# Patient Record
Sex: Male | Born: 1945 | Hispanic: Yes | Smoking: Never smoker
Health system: Southern US, Community
[De-identification: ages and names within clinical notes are randomized; demographics above are authoritative.]

---

## 2015-07-18 DIAGNOSIS — M109 Gout, unspecified: Secondary | ICD-10-CM | POA: Diagnosis not present

## 2015-07-18 DIAGNOSIS — Z1389 Encounter for screening for other disorder: Secondary | ICD-10-CM | POA: Diagnosis not present

## 2015-07-18 DIAGNOSIS — R030 Elevated blood-pressure reading, without diagnosis of hypertension: Secondary | ICD-10-CM | POA: Diagnosis not present

## 2015-07-18 DIAGNOSIS — Z7189 Other specified counseling: Secondary | ICD-10-CM | POA: Diagnosis not present

## 2015-07-18 DIAGNOSIS — Z0000 Encounter for general adult medical examination without abnormal findings: Secondary | ICD-10-CM | POA: Diagnosis not present

## 2015-08-27 DIAGNOSIS — M109 Gout, unspecified: Secondary | ICD-10-CM | POA: Diagnosis not present

## 2017-01-19 DIAGNOSIS — R030 Elevated blood-pressure reading, without diagnosis of hypertension: Secondary | ICD-10-CM | POA: Diagnosis not present

## 2017-01-19 DIAGNOSIS — H9313 Tinnitus, bilateral: Secondary | ICD-10-CM | POA: Diagnosis not present

## 2017-01-19 DIAGNOSIS — H903 Sensorineural hearing loss, bilateral: Secondary | ICD-10-CM | POA: Diagnosis not present

## 2017-04-08 DIAGNOSIS — I10 Essential (primary) hypertension: Secondary | ICD-10-CM | POA: Diagnosis not present

## 2017-04-08 DIAGNOSIS — M109 Gout, unspecified: Secondary | ICD-10-CM | POA: Diagnosis not present

## 2020-02-19 DIAGNOSIS — R339 Retention of urine, unspecified: Secondary | ICD-10-CM | POA: Diagnosis not present

## 2020-02-19 DIAGNOSIS — R109 Unspecified abdominal pain: Secondary | ICD-10-CM | POA: Diagnosis not present

## 2020-02-19 DIAGNOSIS — N390 Urinary tract infection, site not specified: Secondary | ICD-10-CM | POA: Diagnosis not present

## 2020-02-22 DIAGNOSIS — N401 Enlarged prostate with lower urinary tract symptoms: Secondary | ICD-10-CM | POA: Diagnosis not present

## 2020-02-22 DIAGNOSIS — Z978 Presence of other specified devices: Secondary | ICD-10-CM | POA: Diagnosis not present

## 2020-02-22 DIAGNOSIS — Z7689 Persons encountering health services in other specified circumstances: Secondary | ICD-10-CM | POA: Diagnosis not present

## 2020-02-22 DIAGNOSIS — R338 Other retention of urine: Secondary | ICD-10-CM | POA: Diagnosis not present

## 2020-02-27 DIAGNOSIS — Z139 Encounter for screening, unspecified: Secondary | ICD-10-CM | POA: Diagnosis not present

## 2020-02-27 DIAGNOSIS — Z13220 Encounter for screening for lipoid disorders: Secondary | ICD-10-CM | POA: Diagnosis not present

## 2020-02-27 DIAGNOSIS — Z1331 Encounter for screening for depression: Secondary | ICD-10-CM | POA: Diagnosis not present

## 2020-02-27 DIAGNOSIS — Z0000 Encounter for general adult medical examination without abnormal findings: Secondary | ICD-10-CM | POA: Diagnosis not present

## 2020-02-27 DIAGNOSIS — Z125 Encounter for screening for malignant neoplasm of prostate: Secondary | ICD-10-CM | POA: Diagnosis not present

## 2020-02-27 DIAGNOSIS — Z1339 Encounter for screening examination for other mental health and behavioral disorders: Secondary | ICD-10-CM | POA: Diagnosis not present

## 2020-02-27 DIAGNOSIS — I10 Essential (primary) hypertension: Secondary | ICD-10-CM | POA: Diagnosis not present

## 2020-02-27 DIAGNOSIS — Z136 Encounter for screening for cardiovascular disorders: Secondary | ICD-10-CM | POA: Diagnosis not present

## 2020-03-11 DIAGNOSIS — T839XXA Unspecified complication of genitourinary prosthetic device, implant and graft, initial encounter: Secondary | ICD-10-CM | POA: Diagnosis not present

## 2020-03-13 DIAGNOSIS — R8290 Unspecified abnormal findings in urine: Secondary | ICD-10-CM | POA: Diagnosis not present

## 2020-03-13 DIAGNOSIS — R339 Retention of urine, unspecified: Secondary | ICD-10-CM | POA: Diagnosis not present

## 2020-03-18 DIAGNOSIS — N390 Urinary tract infection, site not specified: Secondary | ICD-10-CM | POA: Diagnosis not present

## 2020-03-18 DIAGNOSIS — R339 Retention of urine, unspecified: Secondary | ICD-10-CM | POA: Diagnosis not present

## 2020-03-18 DIAGNOSIS — R310 Gross hematuria: Secondary | ICD-10-CM | POA: Diagnosis not present

## 2020-03-25 DIAGNOSIS — R339 Retention of urine, unspecified: Secondary | ICD-10-CM | POA: Diagnosis not present

## 2020-04-01 DIAGNOSIS — N401 Enlarged prostate with lower urinary tract symptoms: Secondary | ICD-10-CM | POA: Diagnosis not present

## 2020-04-01 DIAGNOSIS — R339 Retention of urine, unspecified: Secondary | ICD-10-CM | POA: Diagnosis not present

## 2020-04-01 DIAGNOSIS — N138 Other obstructive and reflux uropathy: Secondary | ICD-10-CM | POA: Diagnosis not present

## 2020-04-01 DIAGNOSIS — R338 Other retention of urine: Secondary | ICD-10-CM | POA: Diagnosis not present

## 2020-04-02 ENCOUNTER — Inpatient Hospital Stay (HOSPITAL_COMMUNITY): Payer: Medicare HMO

## 2020-04-02 ENCOUNTER — Emergency Department (HOSPITAL_COMMUNITY): Payer: Medicare HMO

## 2020-04-02 ENCOUNTER — Encounter (HOSPITAL_COMMUNITY)
Admission: EM | Disposition: A | Discharge status: Skilled Nursing Facility | Payer: Self-pay | Source: Home / Self Care | Attending: Neurology

## 2020-04-02 ENCOUNTER — Inpatient Hospital Stay: Admit: 2020-04-02 | Payer: Medicare HMO

## 2020-04-02 ENCOUNTER — Inpatient Hospital Stay (HOSPITAL_COMMUNITY)
Admission: EM | Admit: 2020-04-02 | Discharge: 2020-04-25 | DRG: 023 | Disposition: A | Discharge status: Skilled Nursing Facility | Payer: Medicare HMO | Attending: Student in an Organized Health Care Education/Training Program | Admitting: Student in an Organized Health Care Education/Training Program

## 2020-04-02 ENCOUNTER — Emergency Department (HOSPITAL_COMMUNITY): Payer: Medicare HMO | Admitting: Certified Registered"

## 2020-04-02 DIAGNOSIS — T17908A Unspecified foreign body in respiratory tract, part unspecified causing other injury, initial encounter: Secondary | ICD-10-CM | POA: Diagnosis not present

## 2020-04-02 DIAGNOSIS — I160 Hypertensive urgency: Secondary | ICD-10-CM | POA: Diagnosis present

## 2020-04-02 DIAGNOSIS — R339 Retention of urine, unspecified: Secondary | ICD-10-CM | POA: Diagnosis not present

## 2020-04-02 DIAGNOSIS — Z7141 Alcohol abuse counseling and surveillance of alcoholic: Secondary | ICD-10-CM

## 2020-04-02 DIAGNOSIS — B9689 Other specified bacterial agents as the cause of diseases classified elsewhere: Secondary | ICD-10-CM | POA: Diagnosis present

## 2020-04-02 DIAGNOSIS — R471 Dysarthria and anarthria: Secondary | ICD-10-CM | POA: Diagnosis present

## 2020-04-02 DIAGNOSIS — E876 Hypokalemia: Secondary | ICD-10-CM | POA: Diagnosis not present

## 2020-04-02 DIAGNOSIS — E870 Hyperosmolality and hypernatremia: Secondary | ICD-10-CM | POA: Diagnosis present

## 2020-04-02 DIAGNOSIS — R1312 Dysphagia, oropharyngeal phase: Secondary | ICD-10-CM | POA: Diagnosis not present

## 2020-04-02 DIAGNOSIS — Z79899 Other long term (current) drug therapy: Secondary | ICD-10-CM

## 2020-04-02 DIAGNOSIS — R29715 NIHSS score 15: Secondary | ICD-10-CM | POA: Diagnosis present

## 2020-04-02 DIAGNOSIS — R509 Fever, unspecified: Secondary | ICD-10-CM | POA: Diagnosis not present

## 2020-04-02 DIAGNOSIS — I69354 Hemiplegia and hemiparesis following cerebral infarction affecting left non-dominant side: Secondary | ICD-10-CM | POA: Diagnosis not present

## 2020-04-02 DIAGNOSIS — I639 Cerebral infarction, unspecified: Secondary | ICD-10-CM

## 2020-04-02 DIAGNOSIS — I6300 Cerebral infarction due to thrombosis of unspecified precerebral artery: Secondary | ICD-10-CM | POA: Diagnosis not present

## 2020-04-02 DIAGNOSIS — M25571 Pain in right ankle and joints of right foot: Secondary | ICD-10-CM

## 2020-04-02 DIAGNOSIS — R4781 Slurred speech: Secondary | ICD-10-CM | POA: Diagnosis not present

## 2020-04-02 DIAGNOSIS — N401 Enlarged prostate with lower urinary tract symptoms: Secondary | ICD-10-CM | POA: Diagnosis present

## 2020-04-02 DIAGNOSIS — R338 Other retention of urine: Secondary | ICD-10-CM | POA: Diagnosis present

## 2020-04-02 DIAGNOSIS — R414 Neurologic neglect syndrome: Secondary | ICD-10-CM | POA: Diagnosis present

## 2020-04-02 DIAGNOSIS — Z87440 Personal history of urinary (tract) infections: Secondary | ICD-10-CM

## 2020-04-02 DIAGNOSIS — I609 Nontraumatic subarachnoid hemorrhage, unspecified: Secondary | ICD-10-CM | POA: Diagnosis not present

## 2020-04-02 DIAGNOSIS — E871 Hypo-osmolality and hyponatremia: Secondary | ICD-10-CM | POA: Diagnosis not present

## 2020-04-02 DIAGNOSIS — K5900 Constipation, unspecified: Secondary | ICD-10-CM | POA: Diagnosis present

## 2020-04-02 DIAGNOSIS — R001 Bradycardia, unspecified: Secondary | ICD-10-CM | POA: Diagnosis not present

## 2020-04-02 DIAGNOSIS — R066 Hiccough: Secondary | ICD-10-CM | POA: Diagnosis not present

## 2020-04-02 DIAGNOSIS — N179 Acute kidney failure, unspecified: Secondary | ICD-10-CM | POA: Diagnosis not present

## 2020-04-02 DIAGNOSIS — I161 Hypertensive emergency: Secondary | ICD-10-CM | POA: Diagnosis not present

## 2020-04-02 DIAGNOSIS — G460 Middle cerebral artery syndrome: Secondary | ICD-10-CM | POA: Diagnosis present

## 2020-04-02 DIAGNOSIS — R29810 Facial weakness: Secondary | ICD-10-CM | POA: Diagnosis not present

## 2020-04-02 DIAGNOSIS — I63411 Cerebral infarction due to embolism of right middle cerebral artery: Principal | ICD-10-CM | POA: Diagnosis present

## 2020-04-02 DIAGNOSIS — T17908D Unspecified foreign body in respiratory tract, part unspecified causing other injury, subsequent encounter: Secondary | ICD-10-CM | POA: Diagnosis not present

## 2020-04-02 DIAGNOSIS — G8194 Hemiplegia, unspecified affecting left nondominant side: Secondary | ICD-10-CM | POA: Diagnosis not present

## 2020-04-02 DIAGNOSIS — G9389 Other specified disorders of brain: Secondary | ICD-10-CM | POA: Diagnosis not present

## 2020-04-02 DIAGNOSIS — Z20822 Contact with and (suspected) exposure to covid-19: Secondary | ICD-10-CM | POA: Diagnosis present

## 2020-04-02 DIAGNOSIS — I10 Essential (primary) hypertension: Secondary | ICD-10-CM | POA: Diagnosis present

## 2020-04-02 DIAGNOSIS — M2550 Pain in unspecified joint: Secondary | ICD-10-CM | POA: Diagnosis not present

## 2020-04-02 DIAGNOSIS — Z781 Physical restraint status: Secondary | ICD-10-CM

## 2020-04-02 DIAGNOSIS — G928 Other toxic encephalopathy: Secondary | ICD-10-CM | POA: Diagnosis not present

## 2020-04-02 DIAGNOSIS — E785 Hyperlipidemia, unspecified: Secondary | ICD-10-CM | POA: Diagnosis present

## 2020-04-02 DIAGNOSIS — I63511 Cerebral infarction due to unspecified occlusion or stenosis of right middle cerebral artery: Secondary | ICD-10-CM | POA: Diagnosis not present

## 2020-04-02 DIAGNOSIS — G936 Cerebral edema: Secondary | ICD-10-CM | POA: Diagnosis present

## 2020-04-02 DIAGNOSIS — Z8673 Personal history of transient ischemic attack (TIA), and cerebral infarction without residual deficits: Secondary | ICD-10-CM | POA: Diagnosis not present

## 2020-04-02 DIAGNOSIS — R6339 Other feeding difficulties: Secondary | ICD-10-CM | POA: Diagnosis not present

## 2020-04-02 DIAGNOSIS — M7989 Other specified soft tissue disorders: Secondary | ICD-10-CM | POA: Diagnosis not present

## 2020-04-02 DIAGNOSIS — G935 Compression of brain: Secondary | ICD-10-CM | POA: Diagnosis present

## 2020-04-02 DIAGNOSIS — Z7401 Bed confinement status: Secondary | ICD-10-CM | POA: Diagnosis not present

## 2020-04-02 DIAGNOSIS — I619 Nontraumatic intracerebral hemorrhage, unspecified: Secondary | ICD-10-CM | POA: Diagnosis not present

## 2020-04-02 DIAGNOSIS — R29718 NIHSS score 18: Secondary | ICD-10-CM | POA: Diagnosis not present

## 2020-04-02 DIAGNOSIS — R29818 Other symptoms and signs involving the nervous system: Secondary | ICD-10-CM | POA: Diagnosis not present

## 2020-04-02 DIAGNOSIS — R42 Dizziness and giddiness: Secondary | ICD-10-CM | POA: Diagnosis not present

## 2020-04-02 DIAGNOSIS — Z66 Do not resuscitate: Secondary | ICD-10-CM | POA: Diagnosis not present

## 2020-04-02 DIAGNOSIS — I63011 Cerebral infarction due to thrombosis of right vertebral artery: Secondary | ICD-10-CM | POA: Diagnosis not present

## 2020-04-02 DIAGNOSIS — I6601 Occlusion and stenosis of right middle cerebral artery: Secondary | ICD-10-CM | POA: Diagnosis not present

## 2020-04-02 DIAGNOSIS — N390 Urinary tract infection, site not specified: Secondary | ICD-10-CM | POA: Diagnosis present

## 2020-04-02 DIAGNOSIS — I6389 Other cerebral infarction: Secondary | ICD-10-CM | POA: Diagnosis not present

## 2020-04-02 DIAGNOSIS — M79672 Pain in left foot: Secondary | ICD-10-CM | POA: Diagnosis not present

## 2020-04-02 DIAGNOSIS — G839 Paralytic syndrome, unspecified: Secondary | ICD-10-CM | POA: Diagnosis not present

## 2020-04-02 DIAGNOSIS — W19XXXA Unspecified fall, initial encounter: Secondary | ICD-10-CM | POA: Diagnosis present

## 2020-04-02 DIAGNOSIS — S82891A Other fracture of right lower leg, initial encounter for closed fracture: Secondary | ICD-10-CM | POA: Diagnosis present

## 2020-04-02 DIAGNOSIS — J029 Acute pharyngitis, unspecified: Secondary | ICD-10-CM | POA: Diagnosis not present

## 2020-04-02 DIAGNOSIS — Z9889 Other specified postprocedural states: Secondary | ICD-10-CM | POA: Diagnosis not present

## 2020-04-02 DIAGNOSIS — T426X5A Adverse effect of other antiepileptic and sedative-hypnotic drugs, initial encounter: Secondary | ICD-10-CM | POA: Diagnosis not present

## 2020-04-02 DIAGNOSIS — I63311 Cerebral infarction due to thrombosis of right middle cerebral artery: Secondary | ICD-10-CM | POA: Diagnosis not present

## 2020-04-02 DIAGNOSIS — Z431 Encounter for attention to gastrostomy: Secondary | ICD-10-CM | POA: Diagnosis not present

## 2020-04-02 DIAGNOSIS — N400 Enlarged prostate without lower urinary tract symptoms: Secondary | ICD-10-CM | POA: Diagnosis not present

## 2020-04-02 DIAGNOSIS — R1310 Dysphagia, unspecified: Secondary | ICD-10-CM | POA: Diagnosis not present

## 2020-04-02 HISTORY — PX: RADIOLOGY WITH ANESTHESIA: SHX6223

## 2020-04-02 LAB — CBC WITH DIFFERENTIAL/PLATELET
Abs Immature Granulocytes: 0.05 10*3/uL (ref 0.00–0.07)
Basophils Absolute: 0 10*3/uL (ref 0.0–0.1)
Basophils Relative: 0 %
Eosinophils Absolute: 0 10*3/uL (ref 0.0–0.5)
Eosinophils Relative: 0 %
HCT: 42.4 % (ref 39.0–52.0)
Hemoglobin: 14.9 g/dL (ref 13.0–17.0)
Immature Granulocytes: 0 %
Lymphocytes Relative: 4 %
Lymphs Abs: 0.6 10*3/uL — ABNORMAL LOW (ref 0.7–4.0)
MCH: 29.8 pg (ref 26.0–34.0)
MCHC: 35.1 g/dL (ref 30.0–36.0)
MCV: 84.8 fL (ref 80.0–100.0)
Monocytes Absolute: 0.2 10*3/uL (ref 0.1–1.0)
Monocytes Relative: 1 %
Neutro Abs: 14 10*3/uL — ABNORMAL HIGH (ref 1.7–7.7)
Neutrophils Relative %: 95 %
Platelets: 244 10*3/uL (ref 150–400)
RBC: 5 MIL/uL (ref 4.22–5.81)
RDW: 12.2 % (ref 11.5–15.5)
WBC: 14.8 10*3/uL — ABNORMAL HIGH (ref 4.0–10.5)
nRBC: 0 % (ref 0.0–0.2)

## 2020-04-02 LAB — COMPREHENSIVE METABOLIC PANEL
ALT: 18 U/L (ref 0–44)
AST: 29 U/L (ref 15–41)
Albumin: 3.4 g/dL — ABNORMAL LOW (ref 3.5–5.0)
Alkaline Phosphatase: 86 U/L (ref 38–126)
Anion gap: 12 (ref 5–15)
BUN: 7 mg/dL — ABNORMAL LOW (ref 8–23)
CO2: 21 mmol/L — ABNORMAL LOW (ref 22–32)
Calcium: 8.5 mg/dL — ABNORMAL LOW (ref 8.9–10.3)
Chloride: 106 mmol/L (ref 98–111)
Creatinine, Ser: 0.9 mg/dL (ref 0.61–1.24)
GFR, Estimated: >60 mL/min (ref >60)
Glucose, Bld: 182 mg/dL — ABNORMAL HIGH (ref 70–99)
Potassium: 3.7 mmol/L (ref 3.5–5.1)
Sodium: 139 mmol/L (ref 135–145)
Total Bilirubin: 0.8 mg/dL (ref 0.3–1.2)
Total Protein: 6.5 g/dL (ref 6.5–8.1)

## 2020-04-02 LAB — PROTIME-INR
INR: 1 (ref 0.8–1.2)
Prothrombin Time: 13.1 seconds (ref 11.4–15.2)

## 2020-04-02 LAB — URINALYSIS, ROUTINE W REFLEX MICROSCOPIC
Bilirubin Urine: NEGATIVE
Glucose, UA: >=500 mg/dL — AB
Hgb urine dipstick: NEGATIVE
Ketones, ur: 5 mg/dL — AB
Leukocytes,Ua: NEGATIVE
Nitrite: NEGATIVE
Protein, ur: NEGATIVE mg/dL
Specific Gravity, Urine: 1.023 (ref 1.005–1.030)
pH: 5 (ref 5.0–8.0)

## 2020-04-02 LAB — GLUCOSE, CAPILLARY: Glucose-Capillary: 176 mg/dL — ABNORMAL HIGH (ref 70–99)

## 2020-04-02 LAB — RAPID URINE DRUG SCREEN, HOSP PERFORMED
Amphetamines: NOT DETECTED
Barbiturates: NOT DETECTED
Benzodiazepines: NOT DETECTED
Cocaine: NOT DETECTED
Opiates: NOT DETECTED
Tetrahydrocannabinol: NOT DETECTED

## 2020-04-02 LAB — ETHANOL: Alcohol, Ethyl (B): <10 mg/dL (ref <10)

## 2020-04-02 LAB — APTT: aPTT: 29 seconds (ref 24–36)

## 2020-04-02 IMAGING — XA IR PERCUTANEOUS ART THORMBECTOMY/INFUSION INTRACRANIAL INCLUDE D
8 of 19 series · 8 of 24 positions shown · IV contrast (IODINE)
Comparison: CT/CT angiogram of head and neck [DATE].

INDICATION: 74-year-old male with past medical history is significant for UTI,
urinary retention, recent hematuria and appendectomy presenting with
sudden onset left-sided hemiplegia. Left known well at [DATE] a.m. on
[DATE]. No IV tPA given due to recent hematuria. Premorbid
HEA SUN 0; NIHSS 15. Head CT showed early ischemic changes in
the frontoparietal and insular cortex (aspects 6) and CT angiogram
of the head and neck showed a distal right M1/MCA a with poor
collaterals. He was transferred to our service for a mechanical
thrombectomy.

EXAM:
Diagnostic cerebral angiogram
Mechanical thrombectomy
Flat panel head CT
TECHNIQUE: Informed written consent was obtained from the patient's daughter
after a thorough discussion of the procedural risks, benefits and
alternatives. All questions were addressed. Maximal Sterile Barrier
Technique was utilized including caps, mask, sterile gowns, sterile
gloves, sterile drape, hand hygiene and skin antiseptic. A timeout
was performed prior to the initiation of the procedure.

[Series 1: cerebral · 2 acquisitions, 1 frame shown (1 of 5)]
[im 1/2]
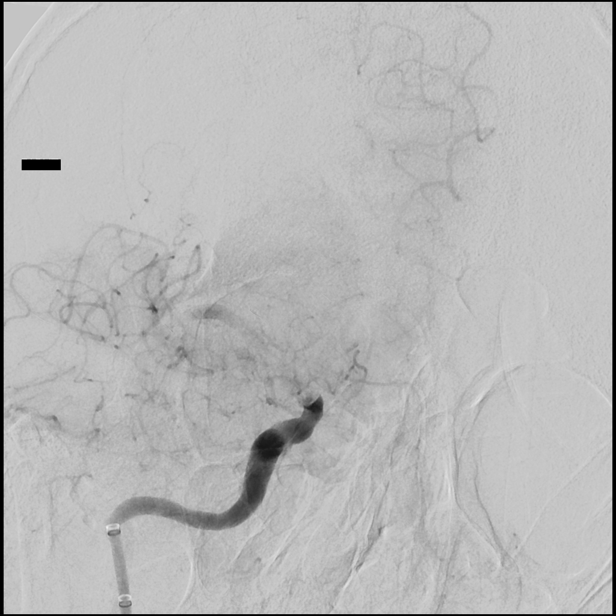

[Series 4: cerebral · 2 acquisitions, 1 frame shown (2 of 5)]
[im 1/2]
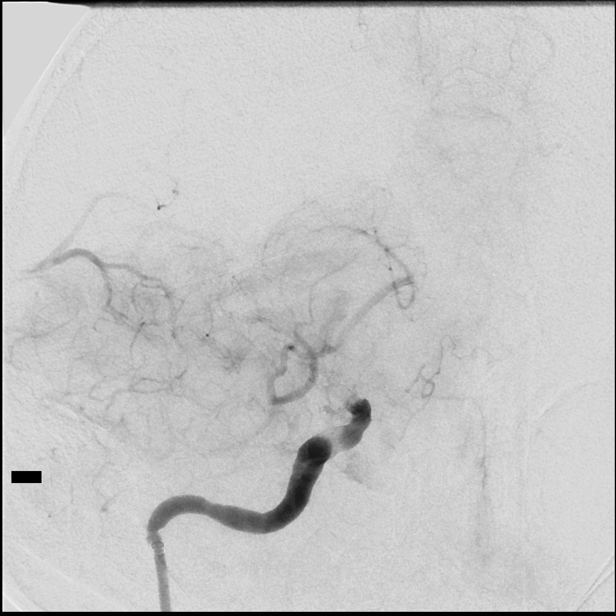

[Series 7: cerebral · 2 acquisitions, 1 frame shown (3 of 5)]
[im 1/2]
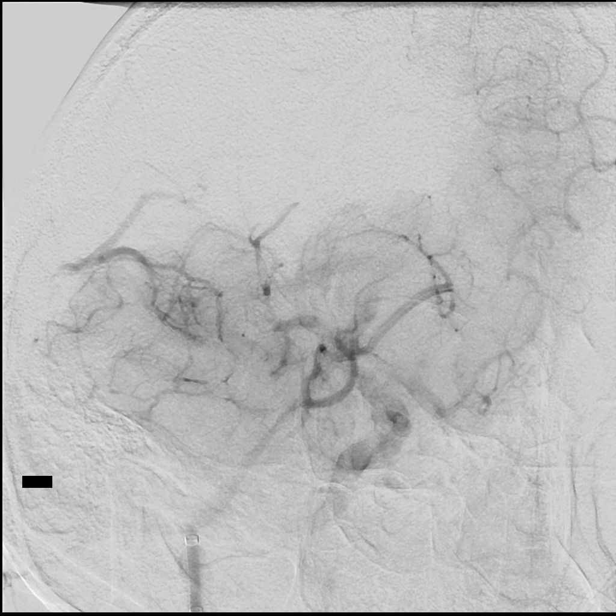

[Series 10: cerebral · 2 acquisitions, 1 frame shown (4 of 5)]
[im 1/2]
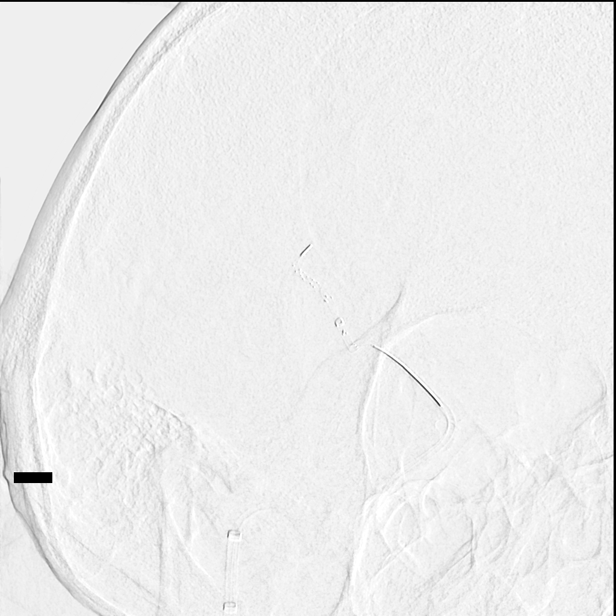

[Series 13: cerebral · 2 acquisitions, 1 frame shown (5 of 5)]
[im 1/2]
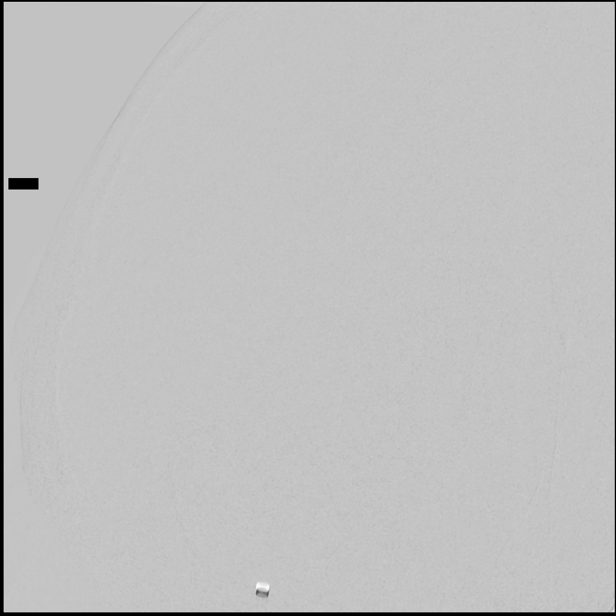

[Series 15: cerebral 2 · 2 acquisitions, 1 frame shown (1 of 2)]
[im 1/2]
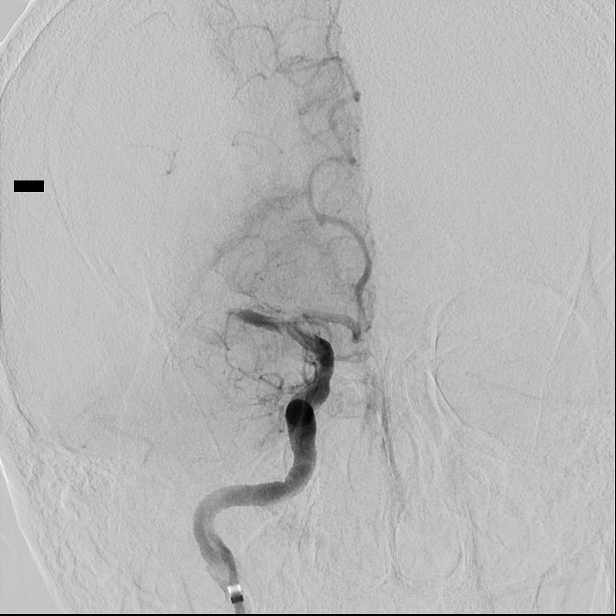

[Series 21: <mpr range> · axial · 5.0mm · 0.42mm/px · 1 of 35 slices shown]
[im 1/35]
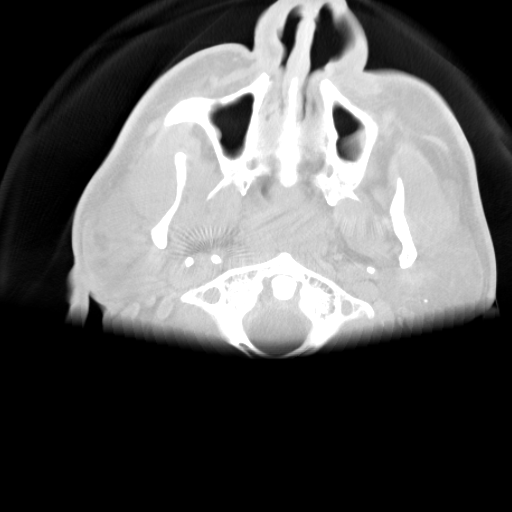

[Series 23: cerebral 2 · 2 acquisitions, 1 frame shown (2 of 2)]
[im 1/2]
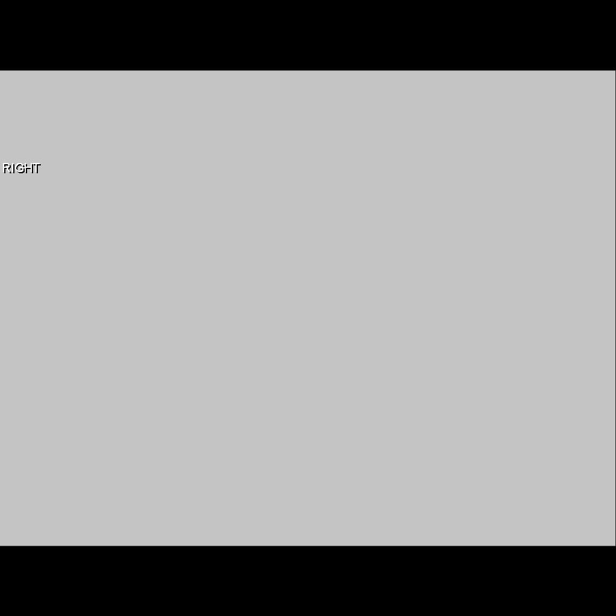

[8 of 24 positions shown; findings below may reference images not displayed]

MEDICATIONS:
10 mg of verapamil intra arterial

ANESTHESIA/SEDATION:
The procedure was performed in the general anesthesia.

CONTRAST:  50mL of Omnipaque 300

FLUOROSCOPY TIME:  Fluoroscopy Time:  minutes  seconds ( mGy).

COMPLICATIONS:
SIR Level A - No therapy, no consequence.
The right groin was prepped and draped in the usual sterile fashion.
Using a micropuncture kit and the modified Seldinger technique,
access was gained to the right common femoral artery and an 8 French
sheath was placed.

Under fluoroscopy, an 8 French Walrus balloon guide catheter was
navigated over a 6 HEA SUN 2 catheter and a 0.035" Terumo
Glidewire into the aortic arch. The catheter was placed into the
right common carotid artery and then advanced into the right
internal carotid artery. The inner catheter was removed. Frontal and
lateral angiograms of the head were obtained.
FINDINGS: 1. Increased tortuosity of the cervical right ICA.
2. Occlusion of the distal right M1/MCA.

PROCEDURE:
Under biplane roadmap, a zoom 71 aspiration catheter was navigated
over a phenom 21 microcatheter and a synchro support microguidewire
into the cavernous segment of the right ICA. The microcatheter was
then navigated over the wire into the right M1 segment. Then, the
aspiration catheter was advanced to the level of occlusion and
connected to a penumbra aspiration pump. The guiding catheter
balloon was inflated. The aspiration catheter was removed under
constant aspiration.

Follow-up angiogram with magnified frontal and lateral views of the
head showed persistent occlusion of the distal right M1/MCA.

Under biplane roadmap, a zoom 71 aspiration catheter was navigated
over a phenom 21 microcatheter and a synchro support microguidewire
into the cavernous segment of the right ICA. The microcatheter was
then navigated over the wire into the right M1 segment. Then, the
aspiration catheter was advanced to the level of occlusion and
connected to a penumbra aspiration pump. The guiding catheter
balloon was inflated. The aspiration catheter was removed under
constant aspiration.

Follow-up angiogram with magnified frontal and lateral views of the
head showed persistent occlusion of the distal right M1/MCA.

Under biplane roadmap, a zoom 71 aspiration catheter was navigated
over a phenom 21 microcatheter and a synchro support microguidewire
into the cavernous segment of the right ICA. The microcatheter was
then navigated over the wire into the right M2/MCA middle division
branch. Then, a 6 mm embotrap stent retriever was deployed spanning
the distal M1 and M2 segment. The device was allowed to intercalated
with the clot for 4 minutes. The microcatheter was removed. The
aspiration catheter was advanced to the level of occlusion and
connected to a penumbra aspiration pump. The guiding catheter
balloon was inflated. The thrombectomy device and aspiration
catheter were removed under constant aspiration.

Follow-up angiogram with magnified frontal and lateral views of the
head showed persistent near occlusion of the distal right M1/MCA
with some contrast penetration into distal branches.

Under biplane roadmap, a zoom 71 aspiration catheter was navigated
over a phenom 21 microcatheter and a synchro support microguidewire
into the cavernous segment of the right ICA. The microcatheter was
then navigated over the wire into the right M2/MCA middle division
branch. Then, a 5 x 37 mm embotrap stent retriever was deployed
spanning the distal M1 and M2 segment. The device was allowed to
intercalated with the clot for 4 minutes. The microcatheter was
removed. The aspiration catheter was advanced to the level of
occlusion and connected to a penumbra aspiration pump. The guiding
catheter balloon was inflated. The thrombectomy device and
aspiration catheter were removed under constant aspiration.

Follow-up angiogram with magnified frontal and lateral views of the
head showed persistent near occlusion of the distal right M1/MCA
with some contrast penetration into distal branches. Delayed
follow-up showed reocclusion.

Under biplane roadmap, a zoom 71 aspiration catheter was navigated
over a phenom 21 microcatheter and a synchro support microguidewire
into the cavernous segment of the right ICA. The microcatheter was
then navigated over the wire into the right M2/MCA middle division
branch. Then, a 5 x 37 mm embotrap stent retriever was deployed
spanning the distal M1 and M2 segment. The device was allowed to
intercalated with the clot for 4 minutes. The microcatheter was
removed. The aspiration catheter was advanced to the level of
occlusion and connected to a penumbra aspiration pump. The guiding
catheter balloon was inflated. The thrombectomy device and
aspiration catheter were removed under constant aspiration.

Follow-up angiogram with magnified frontal and lateral views of the
head showed persistent near occlusion of the distal right M1/MCA
with some contrast penetration into distal branches. Delayed
follow-up showed reocclusion.

Under biplane roadmap, a zoom 71 aspiration catheter was navigated
over a phenom 21 microcatheter and a synchro support microguidewire
into the cavernous segment of the right ICA. The microcatheter was
then navigated over the wire into the right M2/MCA posterior
division branch. Then, a 5 x 37 mm embotrap stent retriever was
deployed spanning the distal M1 and M2 segment. The device was
allowed to intercalated with the clot for 4 minutes. The
microcatheter was removed. The aspiration catheter was advanced to
the level of occlusion and connected to a penumbra aspiration pump.
The guiding catheter balloon was inflated. The thrombectomy device
and aspiration catheter were removed under constant aspiration.

Follow-up angiogram with magnified frontal and lateral views of the
head showed persistent near occlusion of the distal right M1/MCA
with some contrast penetration into distal branches. Delayed
follow-up showed reocclusion.

Flat panel CT of the head was obtained and post processed in a
separate workstation with concurrent attending physician
supervision. Selected images were sent to PACS. No evidence of
complication.

Under biplane roadmap, a zoom 71 aspiration catheter was navigated
over a phenom 21 microcatheter and a synchro support microguidewire
into the cavernous segment of the right ICA. The microcatheter was
then navigated over the wire into the right M2/MCA posterior
division branch. Then, a tiger 7 stent retriever was deployed
spanning the distal M1 and M2 segment. The device was allowed to
intercalated with the clot for 4 minutes. The microcatheter was
removed. The aspiration catheter was advanced to the level of
occlusion and connected to a penumbra aspiration pump. The guiding
catheter balloon was inflated. The thrombectomy device and
aspiration catheter were removed under constant aspiration.

Follow-up angiogram with magnified frontal and lateral views of the
head showed persistent near occlusion of the distal right M1/MCA
with some contrast penetration into distal branches. Delayed
follow-up showed reocclusion.

Under biplane roadmap, a zoom 71 aspiration catheter was navigated
over a phenom 21 microcatheter and a synchro support microguidewire
into the cavernous segment of the right ICA. The microcatheter was
then navigated over the wire into the right M2/MCA posterior
division branch. Then, a 4 x 40 mm solitaire stent retriever was
deployed spanning the distal M1 and M2 segment. The device was
allowed to intercalated with the clot for 4 minutes. The
microcatheter was removed. The aspiration catheter was advanced to
the level of occlusion and connected to a penumbra aspiration pump.
The guiding catheter balloon was inflated. The thrombectomy device
and aspiration catheter were removed under constant aspiration.

Follow-up angiogram with magnified frontal and lateral views of the
head showed persistent near occlusion of the distal right M1/MCA
with some contrast penetration into distal branches. Delayed
follow-up showed reocclusion.

Under biplane roadmap, a zoom 71 aspiration catheter was navigated
over a phenom 21 microcatheter and a synchro support microguidewire
into the cavernous segment of the right ICA. The microcatheter was
then navigated over the wire into the right M2/MCA middle division
branch. Then, a 4 x 40 mm solitaire stent retriever was deployed
spanning the distal M1 and M2 segment. The device was allowed to
intercalated with the clot for 4 minutes. The microcatheter was
removed. The aspiration catheter was advanced to the level of
occlusion and connected to a penumbra aspiration pump. The guiding
catheter balloon was inflated. The thrombectomy device and
aspiration catheter were removed under constant aspiration.

Follow-up angiogram with magnified frontal and lateral views of the
head showed persistent near occlusion of the distal right M1/MCA
with some contrast penetration into distal branches. Delayed
follow-up showed reocclusion.

Flat panel CT of the head was obtained and post processed in a
separate workstation with concurrent attending physician
supervision. Selected images were sent to PACS. Small contrast
extravasation seen into the right sylvian fissure.

The catheter construct was subsequently withdrawn.

Right common femoral artery angiograms were obtained frontal and
lateral views. The femoral sheath was then exchanged over the wire
for a Perclose ProGlide which was utilized for access closure.
Immediate hemostasis was achieved.
IMPRESSION: Unsuccessful mechanical thrombectomy for treatment of a distal right
M1/MCA occlusion. Multiple attempts performed with temporary partial
recanalization followed by subsequent reocclusion. A total of 8
passes were performed in different branches without recanalization
([X2]).

PLAN:
Patient was transferred to ICU for continued care.

## 2020-04-02 IMAGING — CT CT HEAD W/O CM
2 series · 15 of 30 positions shown, 17 images · non-contrast
Comparison: CT studies earlier same day.

CLINICAL DATA: Follow-up stroke with thrombectomy attempt.

EXAM:
CT HEAD WITHOUT CONTRAST
TECHNIQUE: Contiguous axial images were obtained from the base of the skull
through the vertex without intravenous contrast.

[Series 3: head without · axial · non-contrast · 0.51mm/px · z∈[-37,+88]mm · 7 of 35 slices shown, 9 images]
[im 5/35  brain]
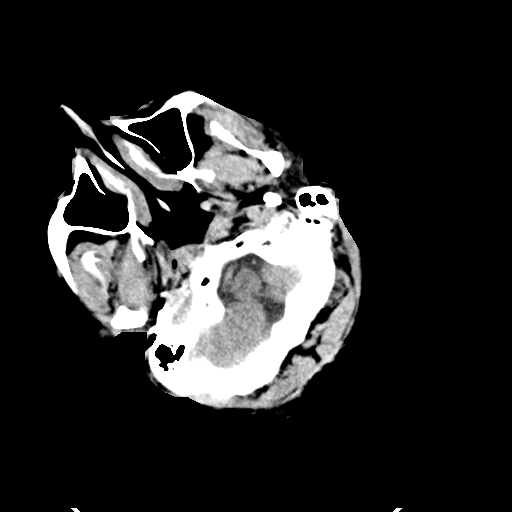
[im 5/35  bone]
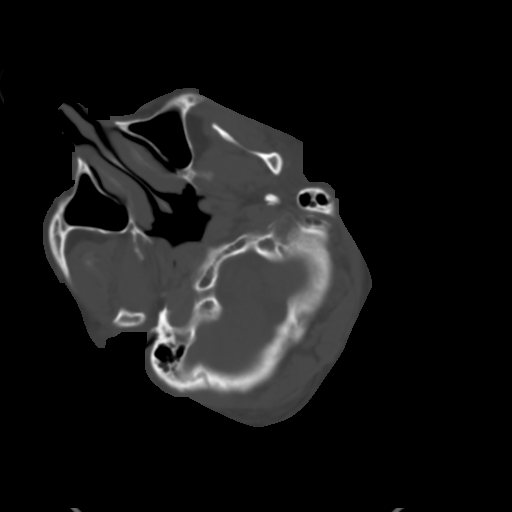
[im 9/35  brain]
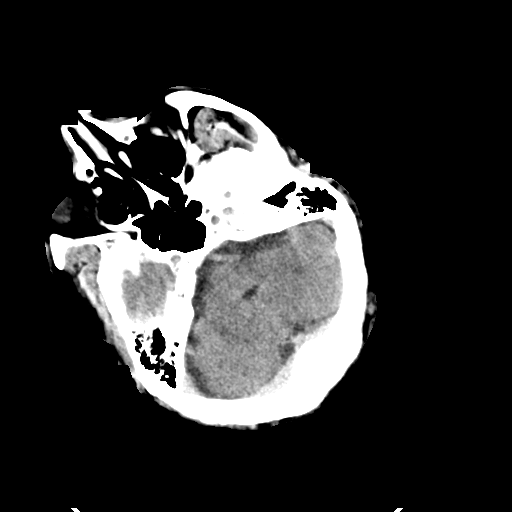
[im 13/35  brain]
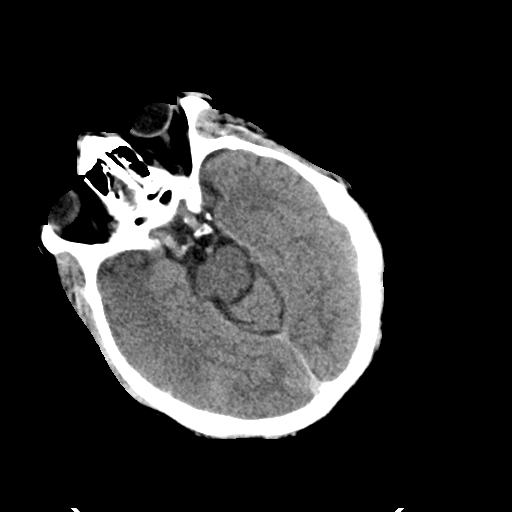
[im 18/35  brain]
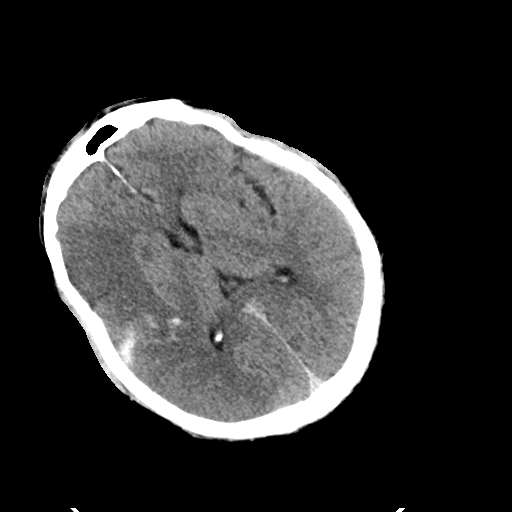
[im 22/35  brain]
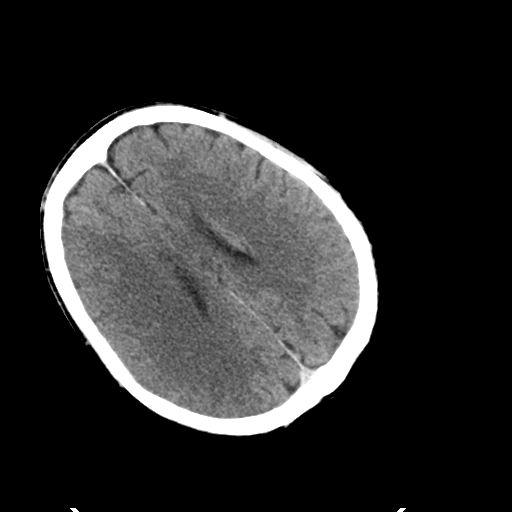
[im 22/35  bone]
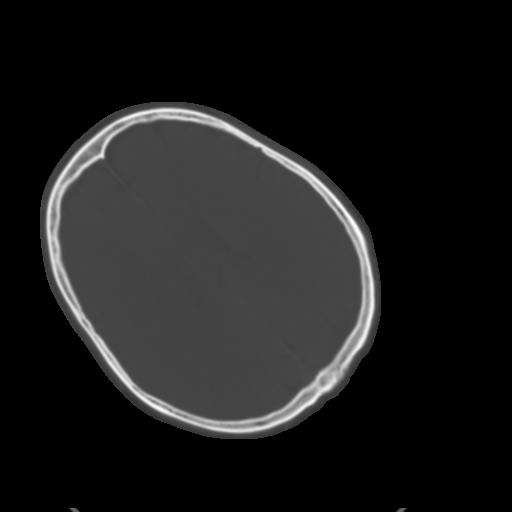
[im 26/35  brain]
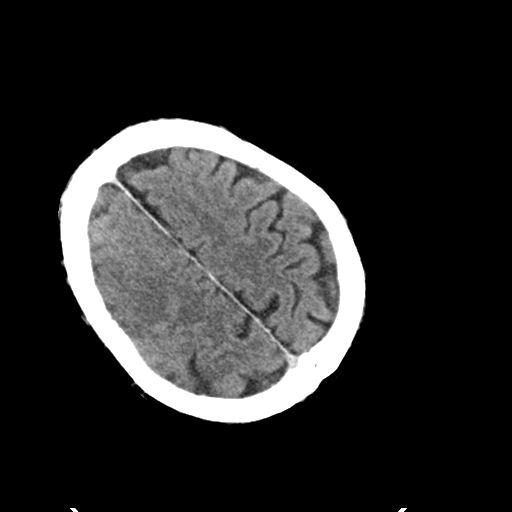
[im 30/35  brain]
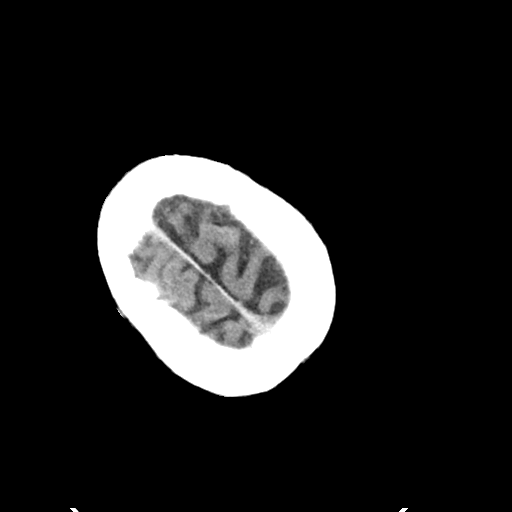

[Series 4: head bone · axial · 0.51mm/px · z∈[-41,+95]mm · 8 of 86 slices shown]
[im 9/86  bone]
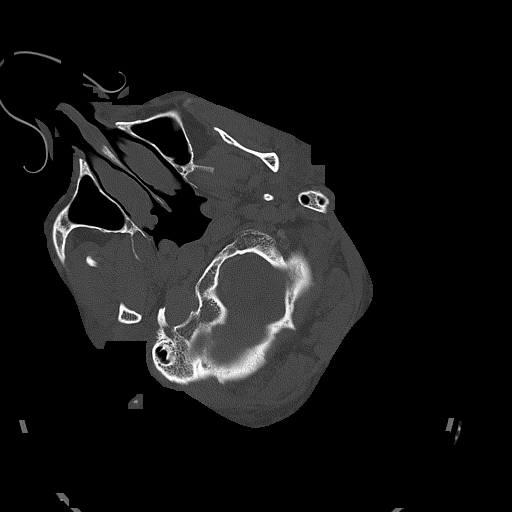
[im 18/86  bone]
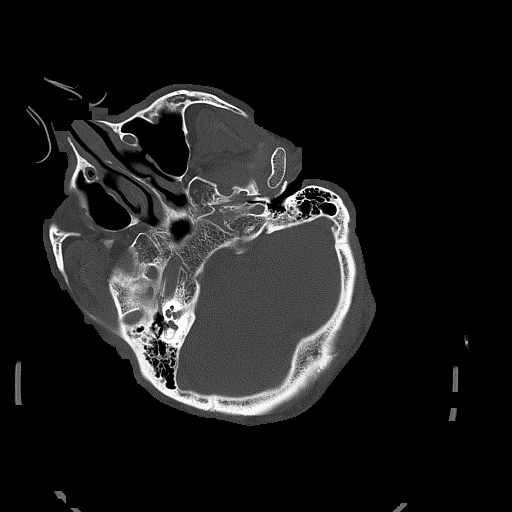
[im 26/86  bone]
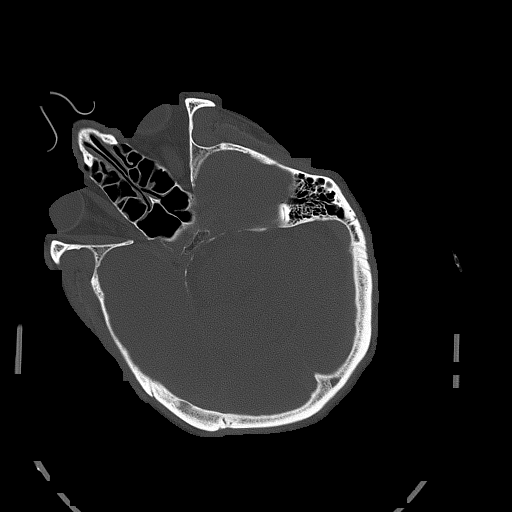
[im 39/86  bone]
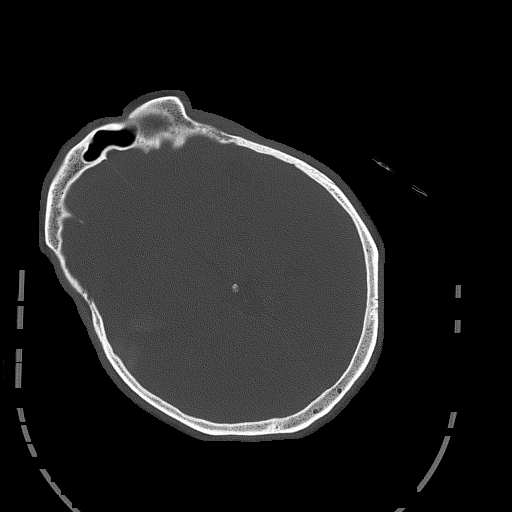
[im 47/86  bone]
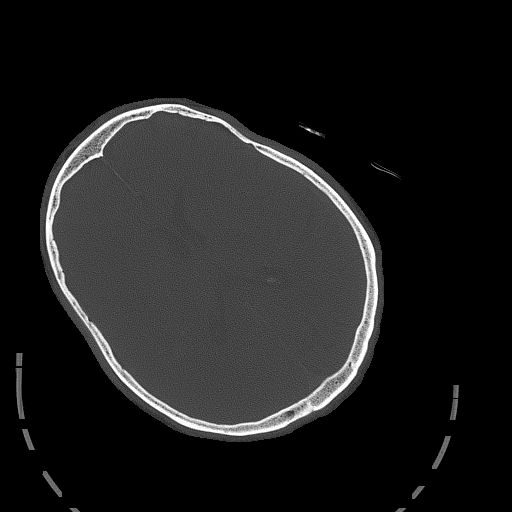
[im 60/86  bone]
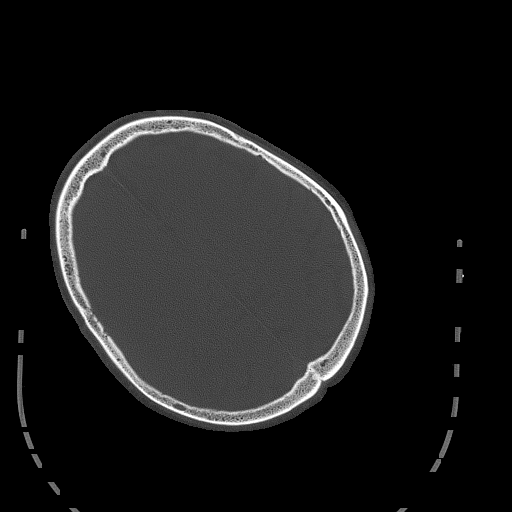
[im 69/86  bone]
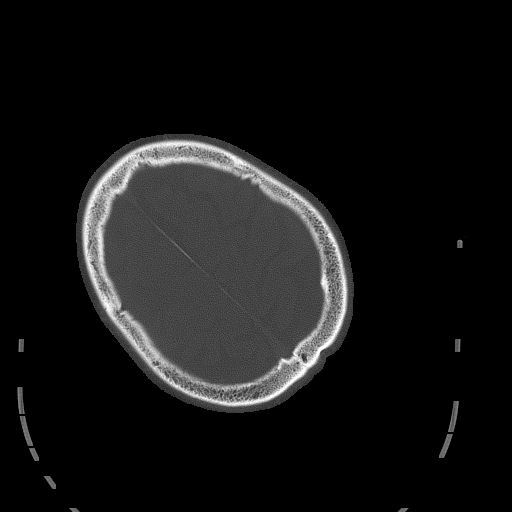
[im 77/86  bone]
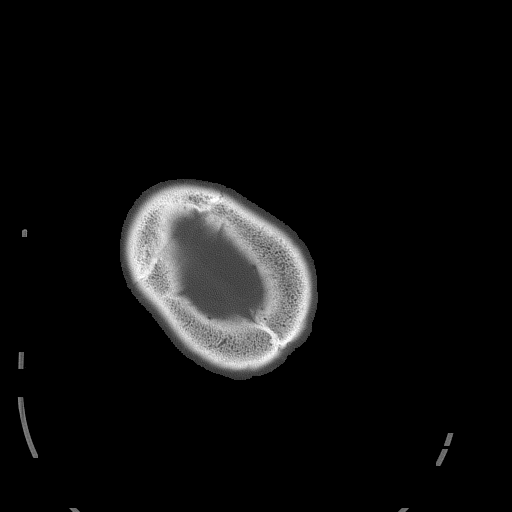

[15 of 30 positions shown; findings below may reference images not displayed]

FINDINGS: Brain: No acute finding affecting the brainstem or left cerebral
hemisphere. On the right, there is low-density now seen throughout
the right middle cerebral artery territory consistent with complete
right MCA territory infarction. This includes at least partial
involvement of the basal ganglia. Areas of hyperdensity in the deep
insula and frontoparietal junction region could represent a
combination of contrast staining and a small amount of hemorrhage.
No large confluent hematoma. Mild swelling with early mass-effect,
right-to-left shift of 2 mm. No hydrocephalus. No extra-axial
collection.

Vascular: No new vascular finding.

Skull: Normal

Sinuses/Orbits: Clear/normal

Other: None
IMPRESSION: Complete right MCA territory infarction, with at least partial
involvement of the basal ganglia. Areas of hyperdensity in the deep
insula and frontoparietal junction region could represent a
combination of contrast staining and a small amount of hemorrhage.
No large confluent hematoma. Mild swelling with early mass-effect,
right-to-left shift of 2 mm.

## 2020-04-02 SURGERY — IR WITH ANESTHESIA
Anesthesia: General

## 2020-04-02 MED ORDER — ACETAMINOPHEN 160 MG/5ML PO SOLN: 650.0000 mg | ORAL | Status: DC | PRN

## 2020-04-02 MED ORDER — PHENYLEPHRINE HCL-NACL 10-0.9 MG/250ML-% IV SOLN
INTRAVENOUS | Status: DC | PRN
  Administered 2020-04-02: 20 ug/min via INTRAVENOUS

## 2020-04-02 MED ORDER — ENOXAPARIN SODIUM 40 MG/0.4ML ~~LOC~~ SOLN
40.0000 mg | SUBCUTANEOUS | Status: DC
  Administered 2020-04-03 – 2020-04-04 (×2): 40 mg via SUBCUTANEOUS
  Filled 2020-04-02 (×2): qty 0.4

## 2020-04-02 MED ORDER — ACETAMINOPHEN 325 MG PO TABS: 650.0000 mg | ORAL_TABLET | ORAL | Status: DC | PRN

## 2020-04-02 MED ORDER — FENTANYL CITRATE (PF) 250 MCG/5ML IJ SOLN
INTRAMUSCULAR | Status: DC | PRN
  Administered 2020-04-02: 100 ug via INTRAVENOUS

## 2020-04-02 MED ORDER — CLOPIDOGREL BISULFATE 300 MG PO TABS
ORAL_TABLET | ORAL | Status: AC
  Filled 2020-04-02: qty 1

## 2020-04-02 MED ORDER — ASPIRIN 81 MG PO CHEW
CHEWABLE_TABLET | ORAL | Status: AC
  Filled 2020-04-02: qty 1

## 2020-04-02 MED ORDER — VERAPAMIL HCL 2.5 MG/ML IV SOLN
INTRAVENOUS | Status: AC
  Filled 2020-04-02: qty 2

## 2020-04-02 MED ORDER — SUGAMMADEX SODIUM 200 MG/2ML IV SOLN
INTRAVENOUS | Status: DC | PRN
  Administered 2020-04-02: 200 mg via INTRAVENOUS

## 2020-04-02 MED ORDER — CLEVIDIPINE BUTYRATE 0.5 MG/ML IV EMUL
INTRAVENOUS | Status: DC | PRN
Start: 2020-04-02 — End: 2020-04-02
  Administered 2020-04-02: 1 mg/h via INTRAVENOUS

## 2020-04-02 MED ORDER — TIROFIBAN HCL IN NACL 5-0.9 MG/100ML-% IV SOLN
INTRAVENOUS | Status: AC
  Filled 2020-04-02: qty 100

## 2020-04-02 MED ORDER — TICAGRELOR 90 MG PO TABS
ORAL_TABLET | ORAL | Status: AC
  Filled 2020-04-02: qty 2

## 2020-04-02 MED ORDER — IOHEXOL 300 MG/ML  SOLN
50.0000 mL | Freq: Once | INTRAMUSCULAR | Status: AC | PRN
  Administered 2020-04-02: 30 mL via INTRA_ARTERIAL

## 2020-04-02 MED ORDER — VERAPAMIL HCL 2.5 MG/ML IV SOLN
INTRAVENOUS | Status: AC | PRN
  Administered 2020-04-02 (×2): 5 mg via INTRAVENOUS

## 2020-04-02 MED ORDER — ASPIRIN 300 MG RE SUPP
300.0000 mg | Freq: Every day | RECTAL | Status: DC
  Administered 2020-04-03 – 2020-04-04 (×2): 300 mg via RECTAL
  Filled 2020-04-02 (×2): qty 1

## 2020-04-02 MED ORDER — LIDOCAINE 2% (20 MG/ML) 5 ML SYRINGE
INTRAMUSCULAR | Status: DC | PRN
  Administered 2020-04-02: 60 mg via INTRAVENOUS

## 2020-04-02 MED ORDER — HEPARIN SODIUM (PORCINE) 1000 UNIT/ML IJ SOLN
INTRAMUSCULAR | Status: AC
  Filled 2020-04-02: qty 1

## 2020-04-02 MED ORDER — SODIUM CHLORIDE 0.9% FLUSH
3.0000 mL | Freq: Two times a day (BID) | INTRAVENOUS | Status: DC
  Administered 2020-04-02 – 2020-04-25 (×35): 3 mL via INTRAVENOUS

## 2020-04-02 MED ORDER — CLEVIDIPINE BUTYRATE 0.5 MG/ML IV EMUL
INTRAVENOUS | Status: AC
  Filled 2020-04-02: qty 50

## 2020-04-02 MED ORDER — CANGRELOR TETRASODIUM 50 MG IV SOLR
INTRAVENOUS | Status: AC
  Filled 2020-04-02: qty 50

## 2020-04-02 MED ORDER — IOHEXOL 240 MG/ML SOLN
INTRAMUSCULAR | Status: AC
  Administered 2020-04-02: 75 mL
  Filled 2020-04-02: qty 200

## 2020-04-02 MED ORDER — ROCURONIUM BROMIDE 10 MG/ML (PF) SYRINGE
PREFILLED_SYRINGE | INTRAVENOUS | Status: DC | PRN
  Administered 2020-04-02 (×2): 50 mg via INTRAVENOUS

## 2020-04-02 MED ORDER — PHENYLEPHRINE 40 MCG/ML (10ML) SYRINGE FOR IV PUSH (FOR BLOOD PRESSURE SUPPORT)
PREFILLED_SYRINGE | INTRAVENOUS | Status: DC | PRN
  Administered 2020-04-02 (×4): 80 ug via INTRAVENOUS

## 2020-04-02 MED ORDER — SODIUM CHLORIDE 0.9 % IV SOLN: INTRAVENOUS | Status: DC | PRN

## 2020-04-02 MED ORDER — SODIUM CHLORIDE 0.9 % IV SOLN
100.0000 mL/h | INTRAVENOUS | Status: DC
  Administered 2020-04-03: 100 mL/h via INTRAVENOUS

## 2020-04-02 MED ORDER — SUCCINYLCHOLINE CHLORIDE 200 MG/10ML IV SOSY
PREFILLED_SYRINGE | INTRAVENOUS | Status: DC | PRN
  Administered 2020-04-02: 120 mg via INTRAVENOUS

## 2020-04-02 MED ORDER — ONDANSETRON HCL 4 MG/2ML IJ SOLN
INTRAMUSCULAR | Status: DC | PRN
  Administered 2020-04-02: 4 mg via INTRAVENOUS

## 2020-04-02 MED ORDER — CLEVIDIPINE BUTYRATE 0.5 MG/ML IV EMUL
0.0000 mg/h | INTRAVENOUS | Status: DC
  Administered 2020-04-02: 12 mg/h via INTRAVENOUS
  Administered 2020-04-02: 15 mg/h via INTRAVENOUS
  Administered 2020-04-05 – 2020-04-06 (×2): 10 mg/h via INTRAVENOUS
  Administered 2020-04-06: 18 mg/h via INTRAVENOUS
  Administered 2020-04-06: 15 mg/h via INTRAVENOUS
  Administered 2020-04-06: 20 mg/h via INTRAVENOUS
  Administered 2020-04-06: 18 mg/h via INTRAVENOUS
  Administered 2020-04-06: 16 mg/h via INTRAVENOUS
  Administered 2020-04-07: 9 mg/h via INTRAVENOUS
  Administered 2020-04-07: 4 mg/h via INTRAVENOUS
  Administered 2020-04-07: 9 mg/h via INTRAVENOUS
  Administered 2020-04-07 (×2): 8 mg/h via INTRAVENOUS
  Administered 2020-04-07: 6 mg/h via INTRAVENOUS
  Administered 2020-04-08: 4 mg/h via INTRAVENOUS
  Filled 2020-04-02 (×3): qty 50
  Filled 2020-04-02: qty 100
  Filled 2020-04-02 (×5): qty 50
  Filled 2020-04-02: qty 100
  Filled 2020-04-02: qty 50
  Filled 2020-04-02: qty 100
  Filled 2020-04-02 (×7): qty 50

## 2020-04-02 MED ORDER — DEXAMETHASONE SODIUM PHOSPHATE 10 MG/ML IJ SOLN
INTRAMUSCULAR | Status: DC | PRN
  Administered 2020-04-02: 5 mg via INTRAVENOUS

## 2020-04-02 MED ORDER — PROPOFOL 10 MG/ML IV BOLUS
INTRAVENOUS | Status: DC | PRN
Start: 2020-04-02 — End: 2020-04-02
  Administered 2020-04-02: 200 mg via INTRAVENOUS

## 2020-04-02 MED ORDER — IOHEXOL 240 MG/ML SOLN
150.0000 mL | Freq: Once | INTRAMUSCULAR | Status: AC | PRN
  Administered 2020-04-02: 15 mL via INTRA_ARTERIAL

## 2020-04-02 MED ORDER — ACETAMINOPHEN 650 MG RE SUPP
650.0000 mg | RECTAL | Status: DC | PRN
  Administered 2020-04-04: 650 mg via RECTAL
  Filled 2020-04-02: qty 1

## 2020-04-02 MED ORDER — SODIUM CHLORIDE 0.9 % IV BOLUS: 500.0000 mL | Freq: Once | INTRAVENOUS | Status: DC

## 2020-04-02 MED ORDER — EPTIFIBATIDE 20 MG/10ML IV SOLN
INTRAVENOUS | Status: AC
  Filled 2020-04-02: qty 10

## 2020-04-02 MED ORDER — CHLORHEXIDINE GLUCONATE CLOTH 2 % EX PADS
6.0000 | MEDICATED_PAD | Freq: Every day | CUTANEOUS | Status: DC
  Administered 2020-04-02 – 2020-04-25 (×23): 6 via TOPICAL

## 2020-04-02 NOTE — ED Notes (Signed)
Pt to IR

## 2020-04-02 NOTE — Anesthesia Postprocedure Evaluation (Signed)
Anesthesia Post Note  Patient: Azel Gumina  Procedure(s) Performed: IR WITH ANESTHESIA (N/A )     Patient location during evaluation: PACU Anesthesia Type: General Level of consciousness: awake and alert and oriented Pain management: pain level controlled Vital Signs Assessment: post-procedure vital signs reviewed and stable Respiratory status: spontaneous breathing, nonlabored ventilation and respiratory function stable Cardiovascular status: blood pressure returned to baseline Postop Assessment: no apparent nausea or vomiting Anesthetic complications: no   No complications documented.  Last Vitals:  Vitals:   04/02/20 1745 04/02/20 1800  BP: (!) 141/61 138/62  Pulse: 82 82  Resp: 13 14  SpO2: 100% 100%    Last Pain: There were no vitals filed for this visit.               Kaylyn Layer

## 2020-04-02 NOTE — Sedation Documentation (Signed)
Pt transported to 4N 28 accompanied by CRNA and RN. Site remains level 0. Pt now starting to wake up moving right extremities. No s/s of distress at this time. VSS.

## 2020-04-02 NOTE — Progress Notes (Signed)
Patient to unit with indwelling catheter from home. Baseline retention/I&O cath, recent catheter placement.   Belongings of ring and cell phone given to daughter [name].

## 2020-04-02 NOTE — Progress Notes (Signed)
Orthopedic Tech Progress Note Patient Details:  Gregory Osborn 1945/08/11 [id number]  Ortho Devices Type of Ortho Device: Knee Immobilizer Ortho Device/Splint Location: RLE Ortho Device/Splint Interventions: Ordered,Application,Adjustment   Post Interventions Patient Tolerated: Well Instructions Provided: Care of device   Donald Pore 04/02/2020, 6:18 PM

## 2020-04-02 NOTE — Code Documentation (Signed)
Patient direct admit from [facility] to [facility] IR. Pt had a CT and CTA at [facility] which showed a distal M1 occlusion according to MD.  He was transferred to [facility] and met by [facility] and MD and taken to [facility] along with a translator as pt is mostly [place] speaking. He was LKW at 1125 when he had a fall and family found him with left sided paralysis, facial droop, and slurred speech. He had a right gaze as well. Pt is currently being treated for a UTI. He has a hx of HTN and BPH and is taking BP meds. He was not able to receive TPA r/t hematuria. He has an indwelling catheter for chronic use. Pt's NIHSS 17 on assessment. (see documentation for details). Once in IR patient was prepped and intubated by anesthesia. Stroke RN and IR MD got verbal consent over the phone with patient's daughter for thrombectomy. Patient was taken into IR suite and hand off was made with IR RN. [name] will fax over [facility] results once they come back. Results are unknown at this time. Family was told that they will be updated by phone when procedure is complete. 4N ICU charge RN notified of bed need.  Linna Thebeau, Rande Brunt, RN  Stroke Response RN

## 2020-04-02 NOTE — Anesthesia Preprocedure Evaluation (Signed)
Anesthesia Evaluation  Patient identified by MRN, date of birth, ID band Patient awake    Reviewed: Allergy & Precautions, H&P , NPO status , Patient's Chart, lab work & pertinent test results  Airway Mallampati: II  TM Distance: >3 FB Neck ROM: Full    Dental no notable dental hx. (+) Poor Dentition, Dental Advisory Given   Pulmonary neg pulmonary ROS,    Pulmonary exam normal breath sounds clear to auscultation       Cardiovascular hypertension,  Rhythm:Regular Rate:Normal     Neuro/Psych CVA, Residual Symptoms negative psych ROS   GI/Hepatic negative GI ROS, Neg liver ROS,   Endo/Other  negative endocrine ROS  Renal/GU negative Renal ROS  negative genitourinary   Musculoskeletal   Abdominal   Peds  Hematology negative hematology ROS (+)   Anesthesia Other Findings   Reproductive/Obstetrics negative OB ROS                             Anesthesia Physical Anesthesia Plan  ASA: III and emergent  Anesthesia Plan: General   Post-op Pain Management:    Induction: Intravenous, Rapid sequence and Cricoid pressure planned  PONV Risk Score and Plan: 2 and Ondansetron, Dexamethasone and Treatment may vary due to age or medical condition  Airway Management Planned: Oral ETT  Additional Equipment:   Intra-op Plan:   Post-operative Plan: Extubation in OR and Possible Post-op intubation/ventilation  Informed Consent: I have reviewed the patients History and Physical, chart, labs and discussed the procedure including the risks, benefits and alternatives for the proposed anesthesia with the patient or authorized representative who has indicated his/her understanding and acceptance.     Dental advisory given  Plan Discussed with: CRNA  Anesthesia Plan Comments:         Anesthesia Quick Evaluation

## 2020-04-02 NOTE — Progress Notes (Signed)
One yellow colored ring upon admission no other belongings. Placed ring in cup at computer.

## 2020-04-02 NOTE — Anesthesia Procedure Notes (Signed)
Procedure Name: Intubation Date/Time: 04/02/2020 1:58 PM Performed by: Tressia Miners, CRNA Pre-anesthesia Checklist: Patient identified, Emergency Drugs available, Suction available and Patient being monitored Patient Re-evaluated:Patient Re-evaluated prior to induction Oxygen Delivery Method: Circle System Utilized Preoxygenation: Pre-oxygenation with 100% oxygen Induction Type: IV induction Ventilation: Unable to mask ventilate Laryngoscope Size: Glidescope and 3 Grade View: Grade I Tube type: Oral Tube size: 7.5 mm Number of attempts: 1 Airway Equipment and Method: Stylet Placement Confirmation: ETT inserted through vocal cords under direct vision,  positive ETCO2 and breath sounds checked- equal and bilateral Secured at: 21 cm Tube secured with: Tape Dental Injury: Teeth and Oropharynx as per pre-operative assessment  Comments: Unknown COVID status, stroke code RSI performed with S3 LoPro glidescope blade

## 2020-04-02 NOTE — Procedures (Signed)
INTERVENTIONAL NEURORADIOLOGY BRIEF POSTPROCEDURE NOTE  Diagnostic cerebral angiogram and mechanical thrombectomy   Attending: Dr. Baldemar Lenis   Assistant: None  Diagnosis: Right M1/MCa occlusion  Access site: RCFA 8 F  Access closure: Perclose Proglide  Anesthesia: General anesthesia  Medication used: refer to anesthesia documentation.  Complications: Smal right sylvian SAH  Estimated blood loss:  Specimen: None  Findings: Right M1 occlusion. Multiple passes performed with aspiration and different stent retrievers (embotrap, solitaire and tiger). Temporary recanalization achieved multiple times with subsequent reocclusion. Total of 8 passes performed.   Patient transferred to ICU.

## 2020-04-02 NOTE — Transfer of Care (Signed)
Immediate Anesthesia Transfer of Care Note  Patient: Gregory Osborn  Procedure(s) Performed: IR WITH ANESTHESIA (N/A )  Patient Location: ICU  Anesthesia Type:General  Level of Consciousness: awake  Airway & Oxygen Therapy: Patient Spontanous Breathing and Patient connected to face mask oxygen  Post-op Assessment: Report given to RN, Post -op Vital signs reviewed and stable and pt moving right arm and leg, left sided deficit which was the same as pre-op  Post vital signs: Reviewed and stable  Last Vitals:  Vitals Value Taken Time  BP 109/78 04/02/20 1730  Temp    Pulse 78 04/02/20 1730  Resp 17 04/02/20 1730  SpO2 100 % 04/02/20 1730  Vitals shown include unvalidated device data.  Last Pain: There were no vitals filed for this visit.       Complications: No complications documented.

## 2020-04-02 NOTE — Progress Notes (Signed)
COVID results faxed from [facility] to [facility] ICU. Results were negative and in the chart on the unit. Bryen Hinderman, [place] Scrape, RN

## 2020-04-02 NOTE — H&P (Signed)
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  History is obtained from: Neurologist who evaluated patient at [facility] and diagnosed right M1 occlusion.  HPI: Gregory Osborn is a 75 y.o. male with a history of UTI, urinary retention, hematuria, and appendectomy. He was seen by his urologist yesterday, and was doing well. Plans were made to undergo ultrasound, and to treat for UTI. He has been doing well up until 11:25am when he was noted to develop left hemiplegia. EMS was contacted, and took him to the hospital ED where stroke workup was undertaken. He was noted to have left hemiplegia and neglect. He was not given tPA due to the hematuria. Transferred urgently for thrombectomy.  LKW: 11:30am tpa given?: no, due to hematuria Premorbid modified rankin scale: 0 ICH Score: n/a  ROS: A 14 point ROS was performed and is negative except as noted in the HPI.    No past medical history on file. Appendectomy, UTI, hematuria, urinary retention.  No family history on file.  Social History:  has no history on file for tobacco use, alcohol use, and drug use.  Exam: Current vital signs: There were no vitals taken for this visit. Vital signs in last 24 hours: 150/70 (approx.) per EMS, pulse in the mid  70s. Glucose 117 at home.   Physical Exam  Constitutional: Appears well-developed and well-nourished.  Psych: Affect appropriate to situation Eyes: No scleral injection HENT: No OP obstrucion MSK: no joint deformities.  Cardiovascular: Normal rate and regular rhythm.  Respiratory: Effort normal, non-labored breathing GI: Soft.  No distension. There is no tenderness.  Skin: WDI  Neuro: Mental Status: Patient is awake, alert, oriented to person, place, month, year, and situation. Patient is able to give a clear and coherent history via Nurse, learning disability. No signs of aphasia but has dense left hemi-spatial neglect and tactile neglect. Not anosognosic. Cranial  Nerves: II: Visual Fields are full. Pupils are equal, round, and reactive to light.  III,IV, VI: EOMI without ptosis or diploplia. Gaze preference to the right. Will look to midline and slightly to the left with effort. V: Facial sensation is asymmetric to temperature VII: Facial movement is asymmetric.  VIII: hearing is intact to voice X: deferred XI: deferred XII: deferred  Motor: Tone is normal. Bulk is normal. 0/5 strength on the left (triple flexion to pain in the left leg). 5/5 on the right.  Sensory: Sensation is reduced on the left to crude touch, not aware of being touched. Deep Tendon Reflexes: deferred Plantars: deferred Cerebellar: Plegic on the left.  I have reviewed labs in epic and the results pertinent to this consultation are: No labs other than UA/Culture available.  I have reviewed the images obtained: n/a (verbal report of right M1).  Impression:  I was given verbal report by a neurologist at [facility] earlier today that Gregory Osborn had right M1 occlusion. His stroke scale is still 15, as it was when he first presented to [place].   Recommendations: 1) Gregory Osborn is going directly to mechanical thrombectomy 2) He will be admitted for post-thrombectomy care. Per policy, I will enter admitting orders. 3) He will be transitioned to the stroke service for further care. 4) anti-platelet therapy per IR team s/p completion of thrombectomy. 5) will likely need acute inpatient rehab, barring dramatic recovery s/p reperfusion.  Thank you.  Aram Candela Nyles Mitton

## 2020-04-02 NOTE — Sedation Documentation (Signed)
Spoke with Dorinda Hill, RN, [employer]. Pt COVID swab NEGATIVE.

## 2020-04-03 ENCOUNTER — Encounter (HOSPITAL_COMMUNITY): Payer: Self-pay | Admitting: Neuroradiology

## 2020-04-03 ENCOUNTER — Inpatient Hospital Stay (HOSPITAL_COMMUNITY): Payer: Medicare HMO

## 2020-04-03 DIAGNOSIS — I63011 Cerebral infarction due to thrombosis of right vertebral artery: Secondary | ICD-10-CM | POA: Diagnosis not present

## 2020-04-03 DIAGNOSIS — I63311 Cerebral infarction due to thrombosis of right middle cerebral artery: Secondary | ICD-10-CM

## 2020-04-03 HISTORY — PX: IR PERCUTANEOUS ART THROMBECTOMY/INFUSION INTRACRANIAL INC DIAG ANGIO: IMG6087

## 2020-04-03 HISTORY — PX: IR CT HEAD LTD: IMG2386

## 2020-04-03 LAB — BASIC METABOLIC PANEL
Anion gap: 10 (ref 5–15)
BUN: 8 mg/dL (ref 8–23)
CO2: 23 mmol/L (ref 22–32)
Calcium: 8.8 mg/dL — ABNORMAL LOW (ref 8.9–10.3)
Chloride: 108 mmol/L (ref 98–111)
Creatinine, Ser: 0.93 mg/dL (ref 0.61–1.24)
GFR, Estimated: >60 mL/min (ref >60)
Glucose, Bld: 125 mg/dL — ABNORMAL HIGH (ref 70–99)
Potassium: 3.6 mmol/L (ref 3.5–5.1)
Sodium: 141 mmol/L (ref 135–145)

## 2020-04-03 LAB — SODIUM
Sodium: 143 mmol/L (ref 135–145)
Sodium: 142 mmol/L (ref 135–145)

## 2020-04-03 LAB — HEMOGLOBIN A1C
Hgb A1c MFr Bld: 5.6 % (ref 4.8–5.6)
Mean Plasma Glucose: 114.02 mg/dL

## 2020-04-03 LAB — LIPID PANEL
Cholesterol: 197 mg/dL (ref 0–200)
HDL: 57 mg/dL (ref >40)
LDL Cholesterol: 118 mg/dL — ABNORMAL HIGH (ref 0–99)
Total CHOL/HDL Ratio: 3.5 RATIO
Triglycerides: 109 mg/dL (ref <150)
VLDL: 22 mg/dL (ref 0–40)

## 2020-04-03 LAB — MRSA PCR SCREENING: MRSA by PCR: NEGATIVE

## 2020-04-03 IMAGING — MR MR HEAD W/O CM
6 of 12 series · 26 of 48 positions shown · non-contrast
Comparison: Noncontrast head CT [DATE]. Noncontrast head CT and
CT angiogram head/neck performed [DATE]

CLINICAL DATA: Neuro deficit, acute, stroke suspected.



[Series 4: DWI · axial · 3.0mm · 0.94mm/px · z∈[-37,+113]mm · 6 of 106 slices shown (1 of 4)]
[im 1/106]
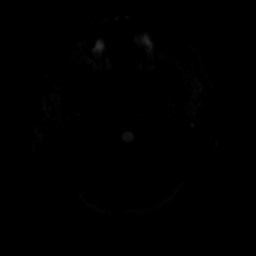
[im 22/106]
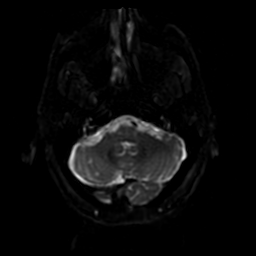
[im 43/106]
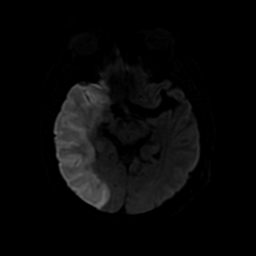
[im 64/106]
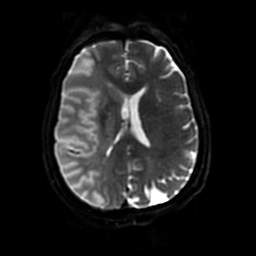
[im 85/106]
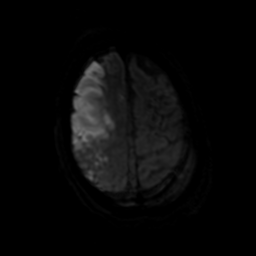
[im 106/106]
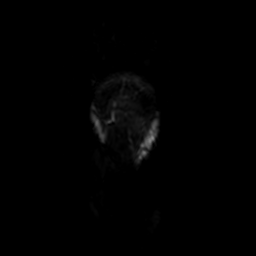

[Series 6: DWI · coronal · 4.0mm · 0.94mm/px · 5 of 76 slices shown (2 of 4)]
[im 1/76]
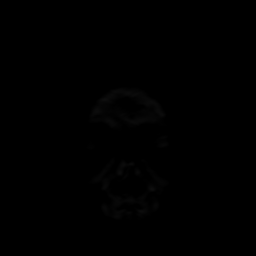
[im 19/76]
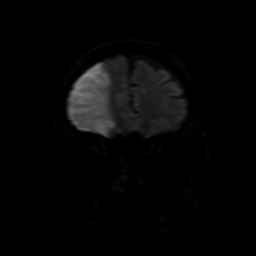
[im 38/76]
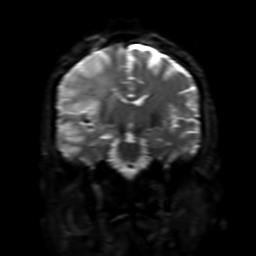
[im 57/76]
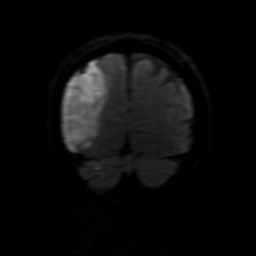
[im 76/76]
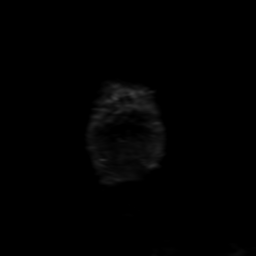

[Series 7: FLAIR · sagittal · 5.0mm · 0.23mm/px · 2 of 25 slices shown (1 of 2)]
[im 1/25]
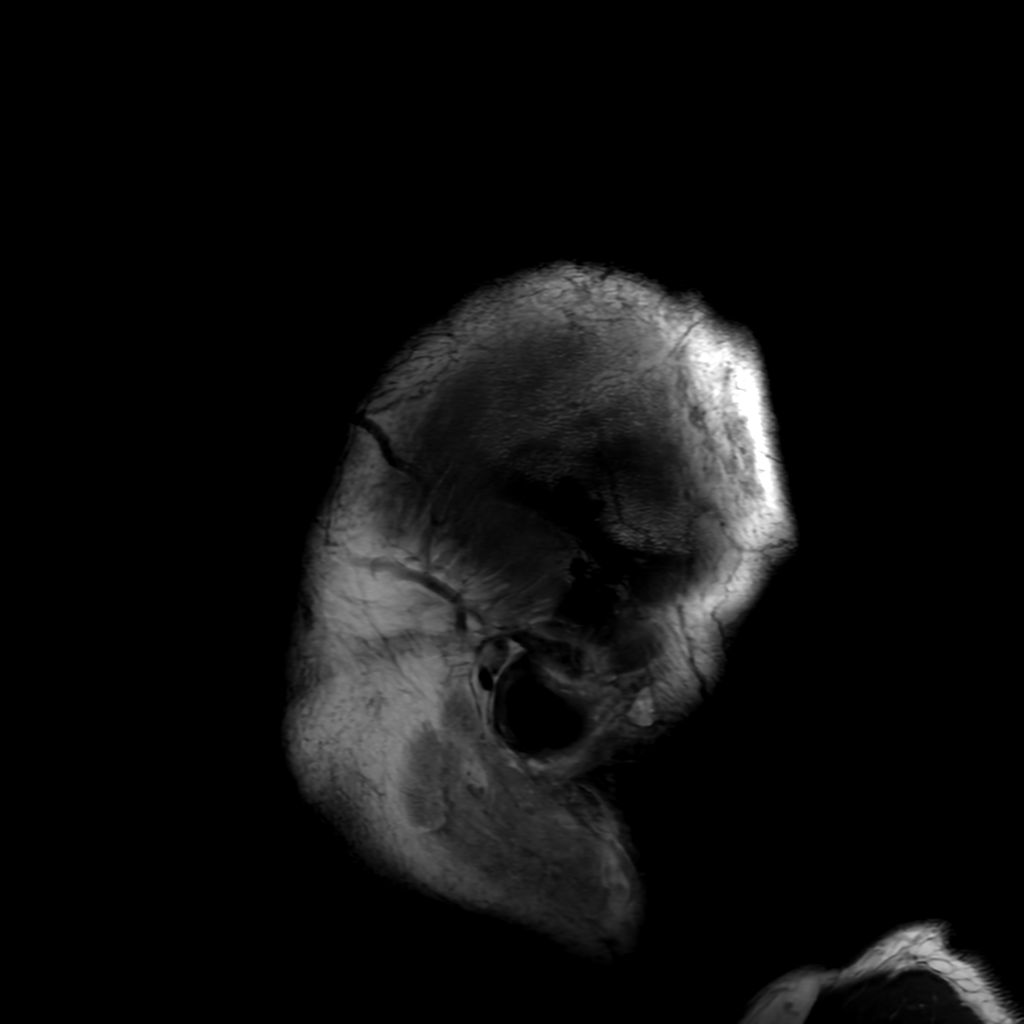
[im 25/25]
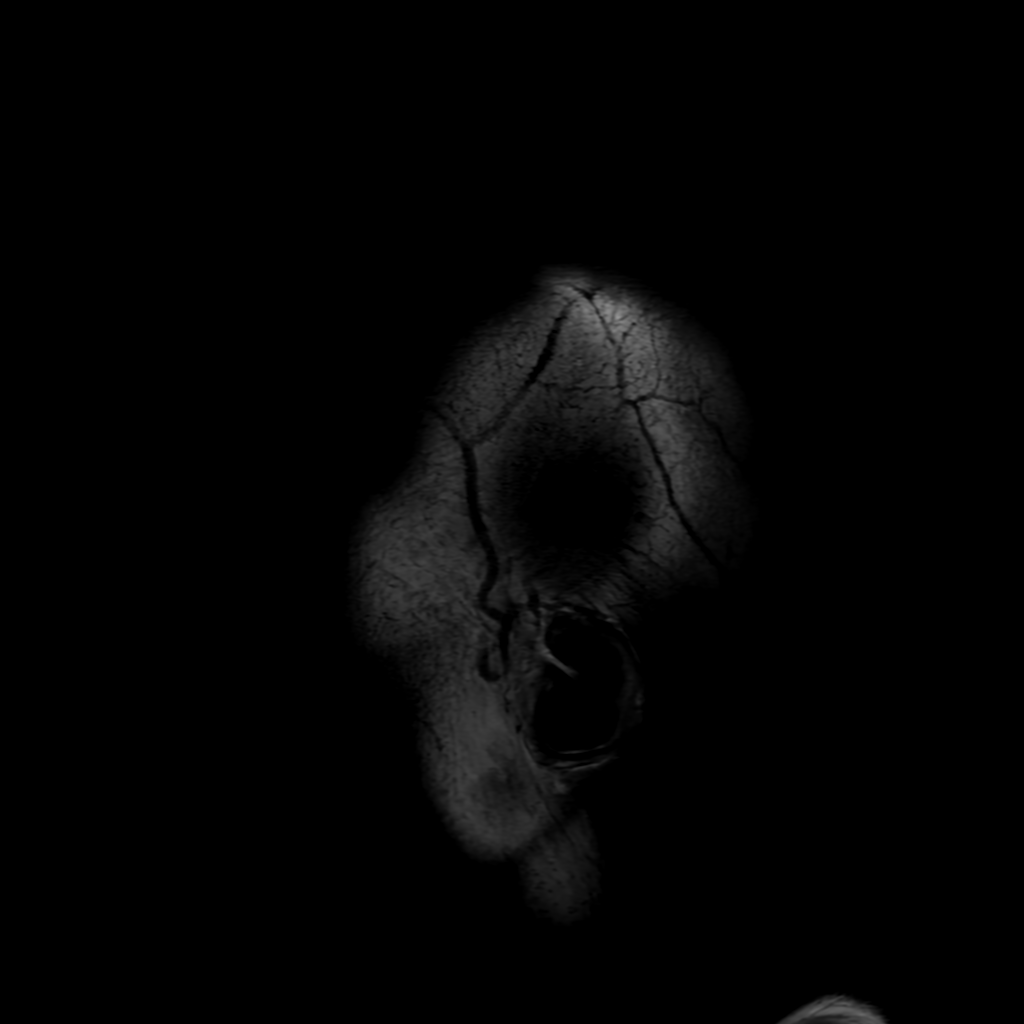

[Series 9: FLAIR · axial · 3.0mm · 0.45mm/px · z∈[-21,+133]mm · 2 of 27 slices shown (2 of 2)]
[im 1/27]
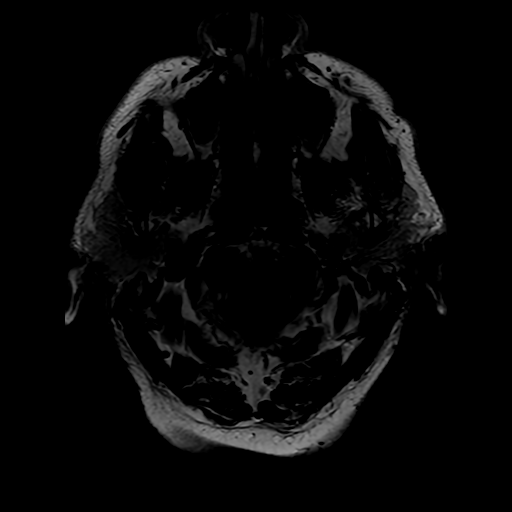
[im 27/27]
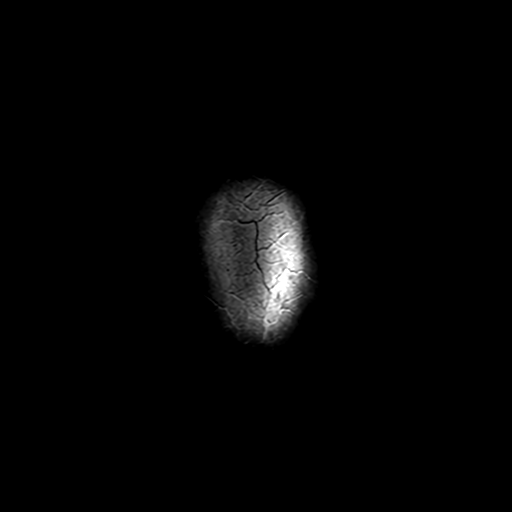

[Series 410: DWI · axial · 3.0mm · 0.94mm/px · z∈[-37,+113]mm · 6 of 106 slices shown (3 of 4)]
[im 1/106]
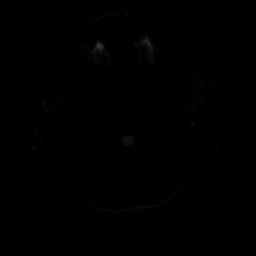
[im 22/106]
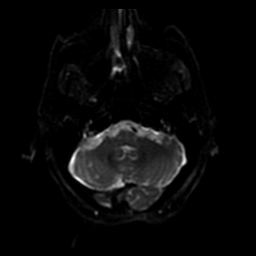
[im 43/106]
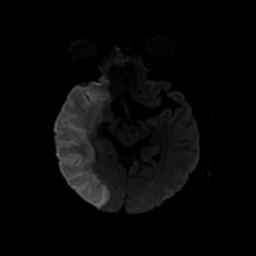
[im 64/106]
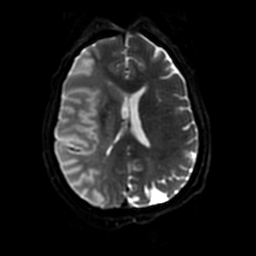
[im 85/106]
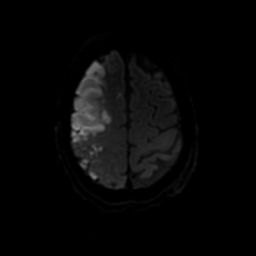
[im 106/106]
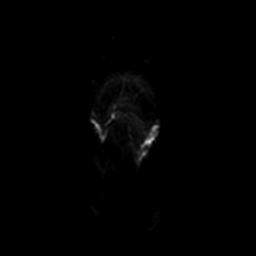

[Series 610: DWI · coronal · 4.0mm · 0.94mm/px · 5 of 76 slices shown (4 of 4)]
[im 1/76]
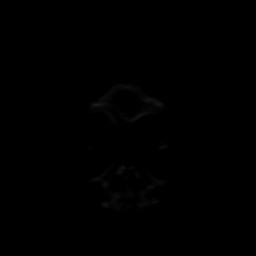
[im 19/76]
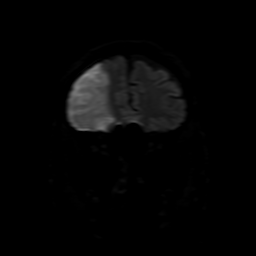
[im 38/76]
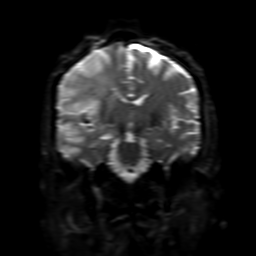
[im 57/76]
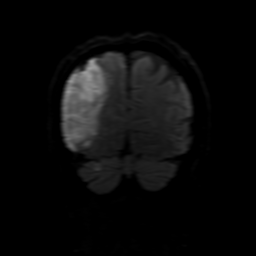
[im 76/76]
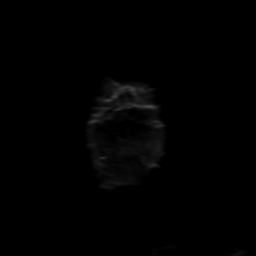

[26 of 48 positions shown; findings below may reference images not displayed]

FINDINGS: MRI HEAD FINDINGS

Brain:

A routine coronal T2 weighted sequence was not obtained.

Cerebral volume is normal for age.

Further progressed from the prior head CT of [DATE], there is an
acute/early subacute infarct involving the majority of the right MCA
vascular territory. This infarct also extends to involve the right
MCA/ACA and right MCA/PCA watershed territories. As compared to the
prior head CT, there is more complete involvement of the right basal
ganglia. Mass effect has slightly increased, with increased partial
effacement of the right lateral ventricle and now 3-4 mm leftward
midline shift.

There are additional subcentimeter acute infarcts within the right
PCA territory within the medial right temporal lobe, and also within
the right cerebellar hemisphere. Possible additional punctate acute
infarct within the paramedian right frontal lobe (ACA territory).

Redemonstrated small-volume subarachnoid hemorrhage within the right
MCA cistern, right sylvian fissure and along the right
temporoparietal lobes.

Background mild multifocal T2/FLAIR hyperintensity within the
cerebral white matter which is nonspecific, but compatible with
chronic small vessel ischemic disease.

Small chronic infarcts within the bilateral cerebellar hemispheres.

Vascular: Reported below.

Skull and upper cervical spine: No focal marrow lesion

Sinuses/Orbits: Visualized orbits show no acute finding. Trace
ethmoid sinus mucosal thickening. Small left maxillary sinus mucous
retention cyst.

MRA HEAD FINDINGS

The intracranial internal carotid arteries are patent. The M1
segment of the right middle cerebral artery is now occluded
proximally (previously occluded distally). The M1 left middle
cerebral artery is patent. No left M2 proximal branch occlusion or
high-grade proximal stenosis. The anterior cerebral arteries are
patent.

The dominant intracranial right vertebral artery is patent without
stenosis. The non dominant and developmentally diminutive left
vertebral artery is patent. The basilar artery is patent. The
posterior cerebral arteries are patent. Sizable right posterior
communicating artery. The left posterior communicating artery is
hypoplastic or absent.

No intracranial aneurysm is identified.

MRA NECK FINDINGS

The common carotid and visualized internal carotid arteries are
patent within the neck without hemodynamically significant stenosis.
Retropharyngeal course of the proximal internal carotid arteries
bilaterally.

The visualized vertebral arteries are patent within the neck without
hemodynamically significant stenosis. The right vertebral artery is
strongly dominant, and the left is developmentally diminutive.

MRI brain impressions #1, #2, #3 and #4 and MRA head impression #1
will be called to the ordering clinician or representative by the
Radiologist Assistant, and communication documented in the PACS or
[REDACTED].
IMPRESSION: MRI brain:

1. Since the prior head CT of [DATE], there has been further
progression of an acute/early subacute infarct involving the
majority of the right MCA vascular territory. This infarct also
involves the right MCA/ACA and MCA/PCA watershed territories. As
compared to the prior CT, there is now more complete involvement of
the right basal ganglia.
2. Slightly increased mass effect with increased partial effacement
of the right lateral ventricle and now 3-4 mm leftward midline
shift.
3. Unchanged small-volume subarachnoid hemorrhage within the right
MCA cistern, sylvian fissure and along the right temporoparietal
lobes.
4. Additional subcentimeter acute infarcts within the medial right
temporal lobe (PCA vascular territory) and right cerebellar
hemisphere.
5. Stable background mild chronic small vessel ischemic disease.
6. Small chronic infarcts within the cerebellar hemispheres
bilaterally.

MRA head:

1. The M1 segment of the right middle cerebral artery is now
occluded at its proximal aspect (previously occluded at its distal
aspect). There is no appreciable flow-related signal within right
MCA vessels more distally.
2. Elsewhere, no intracranial large vessel occlusion or proximal
high-grade arterial stenosis is identified.

MRA neck:

The common carotid, internal carotid and vertebral arteries are
patent within the neck without hemodynamically significant stenosis.

## 2020-04-03 IMAGING — MR MR MRA NECK W/O CM
11 of 15 series · 19 of 48 positions shown · non-contrast
Comparison: Noncontrast head CT [DATE]. Noncontrast head CT and
CT angiogram head/neck performed [DATE]

CLINICAL DATA: Neuro deficit, acute, stroke suspected.



[Series 4: DWI · axial · 3.0mm · 0.94mm/px · z∈[-37,+113]mm · 2 of 106 slices shown (1 of 4)]
[im 1/106]
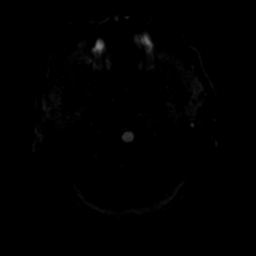
[im 106/106]
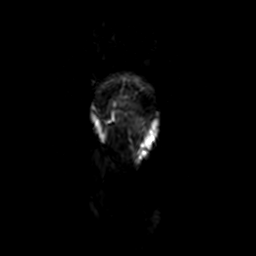

[Series 6: DWI · coronal · 4.0mm · 0.94mm/px · 2 of 76 slices shown (2 of 4)]
[im 1/76]
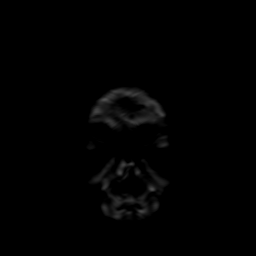
[im 76/76]
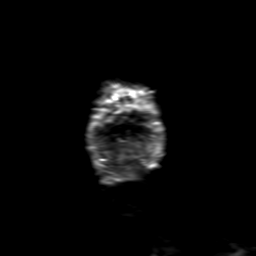

[Series 7: FLAIR · sagittal · 5.0mm · 0.23mm/px · 1 of 25 slices shown (1 of 2)]
[im 1/25]
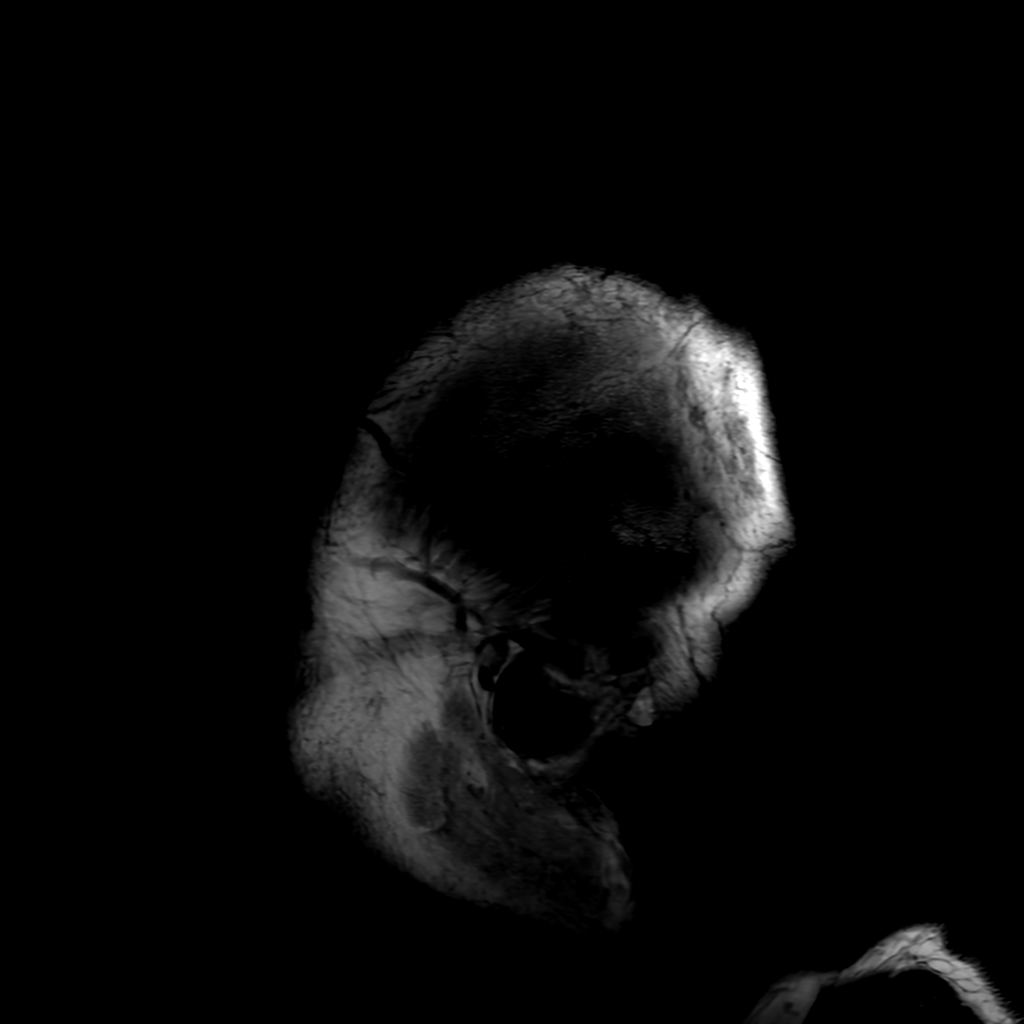

[Series 8: T2 · axial · 5.0mm · 0.23mm/px · 1 of 26 slices shown]
[im 1/26]
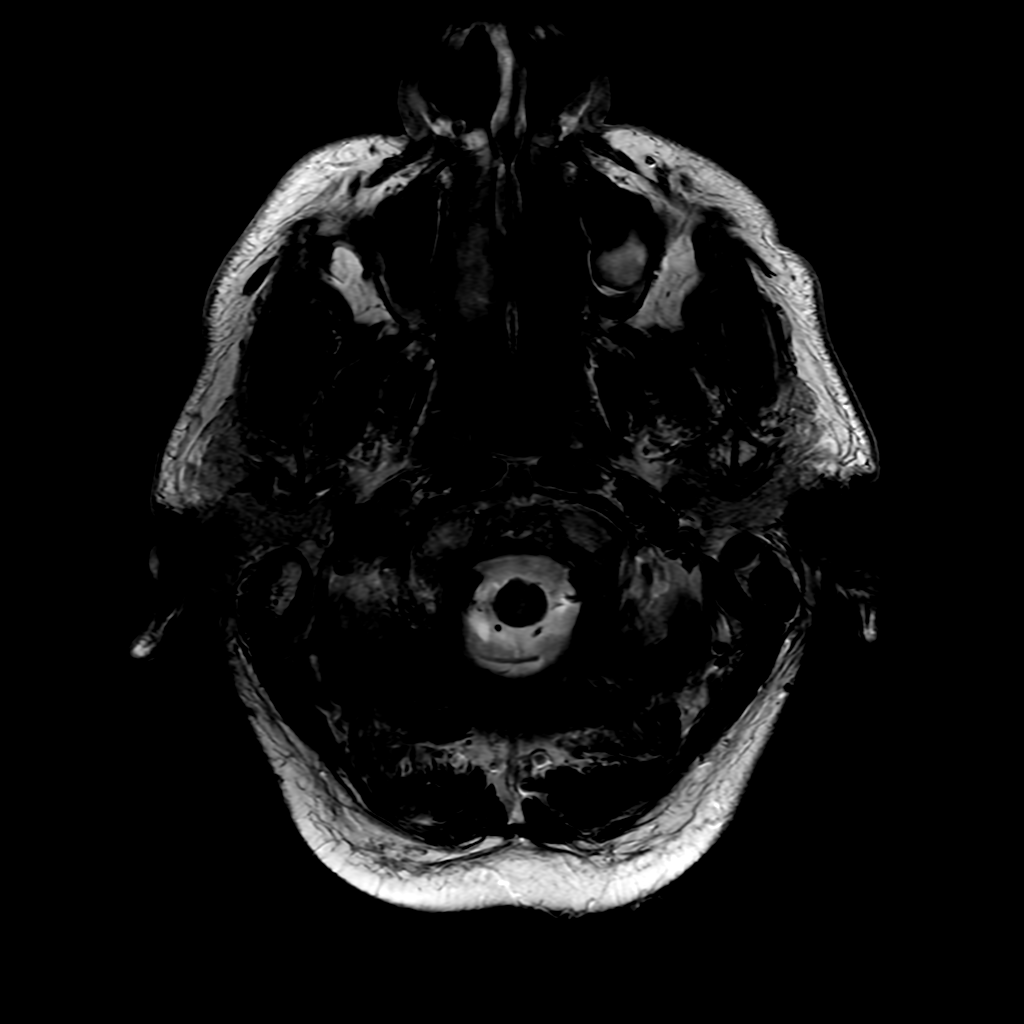

[Series 9: FLAIR · axial · 3.0mm · 0.45mm/px · 1 of 27 slices shown (2 of 2)]
[im 1/27]
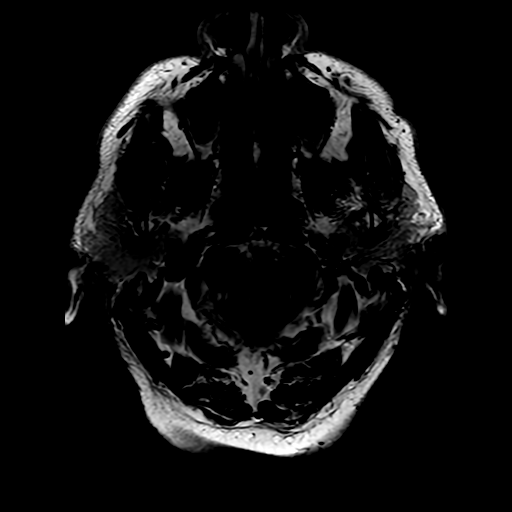

[Series 12: (id) loc ssfse · axial · 5.0mm · 0.59mm/px · 1 of 15 slices shown]
[im 1/15]
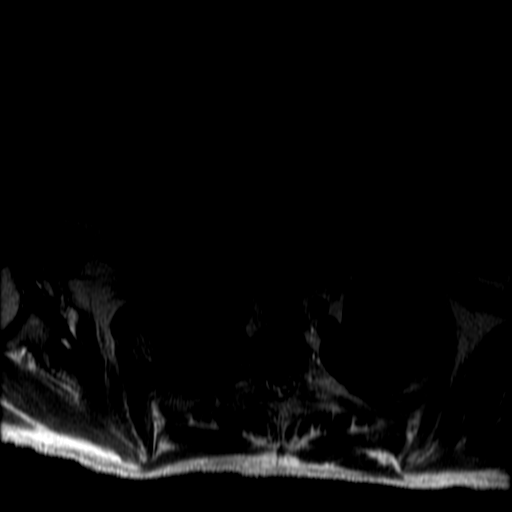

[Series 14: sag inhance (id) · sagittal · 1.2mm · 0.47mm/px · 3 of 360 slices shown]
[im 1/360]
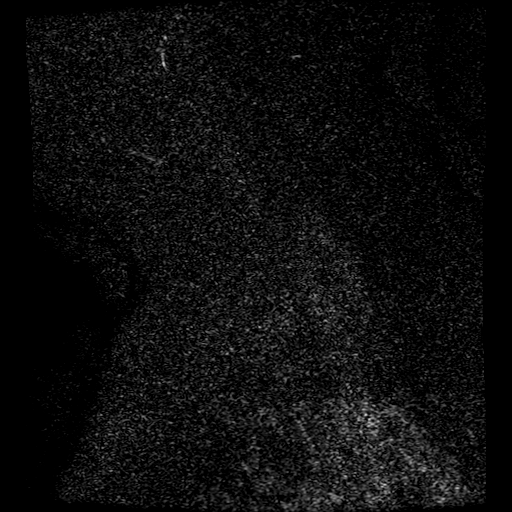
[im 72/360]
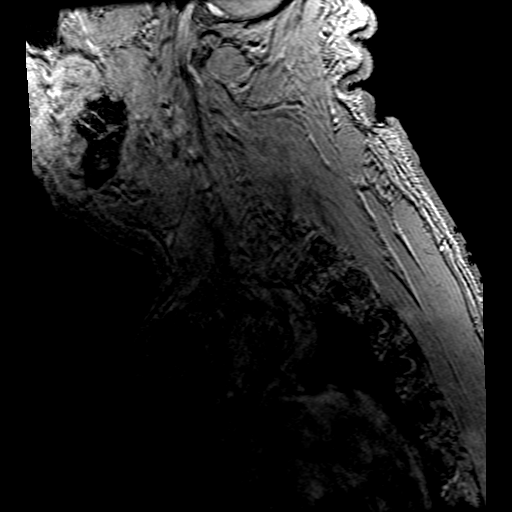
[im 108/360]
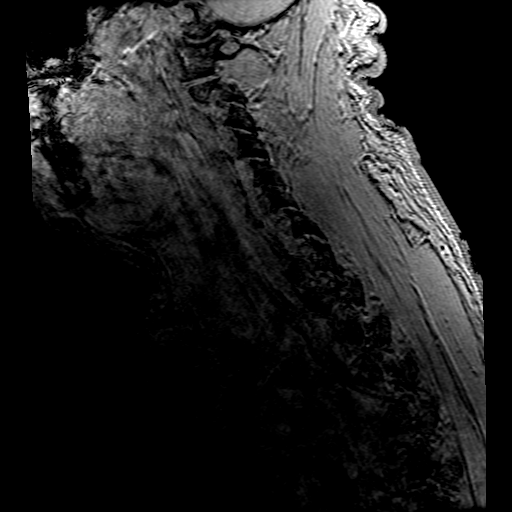

[Series 410: DWI · axial · 3.0mm · 0.94mm/px · z∈[-37,+113]mm · 3 of 106 slices shown (3 of 4)]
[im 1/106]
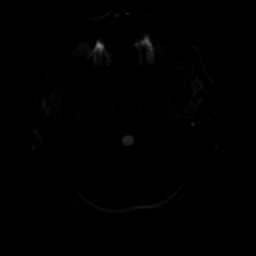
[im 53/106]
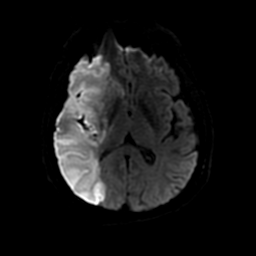
[im 106/106]
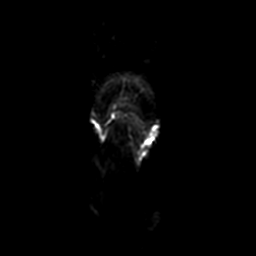

[Series 450: ADC · axial · 3.0mm · 0.94mm/px · z∈[-37,+113]mm · 2 of 52 slices shown (1 of 2)]
[im 1/52]
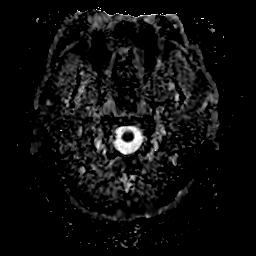
[im 52/52]
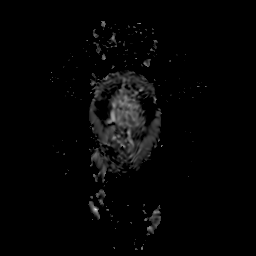

[Series 610: DWI · coronal · 4.0mm · 0.94mm/px · 2 of 76 slices shown (4 of 4)]
[im 1/76]
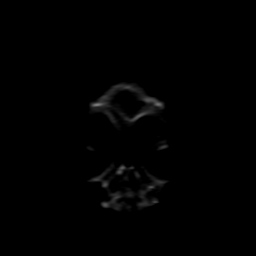
[im 76/76]
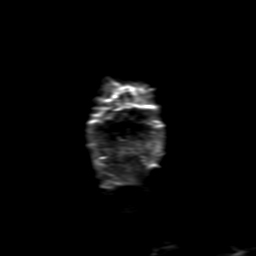

[Series 650: ADC · coronal · 4.0mm · 0.94mm/px · 1 of 38 slices shown (2 of 2)]
[im 1/38]
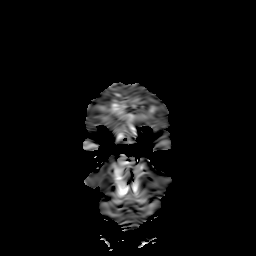

[19 of 48 positions shown; findings below may reference images not displayed]

FINDINGS: MRI HEAD FINDINGS

Brain:

A routine coronal T2 weighted sequence was not obtained.

Cerebral volume is normal for age.

Further progressed from the prior head CT of [DATE], there is an
acute/early subacute infarct involving the majority of the right MCA
vascular territory. This infarct also extends to involve the right
MCA/ACA and right MCA/PCA watershed territories. As compared to the
prior head CT, there is more complete involvement of the right basal
ganglia. Mass effect has slightly increased, with increased partial
effacement of the right lateral ventricle and now 3-4 mm leftward
midline shift.

There are additional subcentimeter acute infarcts within the right
PCA territory within the medial right temporal lobe, and also within
the right cerebellar hemisphere. Possible additional punctate acute
infarct within the paramedian right frontal lobe (ACA territory).

Redemonstrated small-volume subarachnoid hemorrhage within the right
MCA cistern, right sylvian fissure and along the right
temporoparietal lobes.

Background mild multifocal T2/FLAIR hyperintensity within the
cerebral white matter which is nonspecific, but compatible with
chronic small vessel ischemic disease.

Small chronic infarcts within the bilateral cerebellar hemispheres.

Vascular: Reported below.

Skull and upper cervical spine: No focal marrow lesion

Sinuses/Orbits: Visualized orbits show no acute finding. Trace
ethmoid sinus mucosal thickening. Small left maxillary sinus mucous
retention cyst.

MRA HEAD FINDINGS

The intracranial internal carotid arteries are patent. The M1
segment of the right middle cerebral artery is now occluded
proximally (previously occluded distally). The M1 left middle
cerebral artery is patent. No left M2 proximal branch occlusion or
high-grade proximal stenosis. The anterior cerebral arteries are
patent.

The dominant intracranial right vertebral artery is patent without
stenosis. The non dominant and developmentally diminutive left
vertebral artery is patent. The basilar artery is patent. The
posterior cerebral arteries are patent. Sizable right posterior
communicating artery. The left posterior communicating artery is
hypoplastic or absent.

No intracranial aneurysm is identified.

MRA NECK FINDINGS

The common carotid and visualized internal carotid arteries are
patent within the neck without hemodynamically significant stenosis.
Retropharyngeal course of the proximal internal carotid arteries
bilaterally.

The visualized vertebral arteries are patent within the neck without
hemodynamically significant stenosis. The right vertebral artery is
strongly dominant, and the left is developmentally diminutive.

MRI brain impressions #1, #2, #3 and #4 and MRA head impression #1
will be called to the ordering clinician or representative by the
Radiologist Assistant, and communication documented in the PACS or
[REDACTED].
IMPRESSION: MRI brain:

1. Since the prior head CT of [DATE], there has been further
progression of an acute/early subacute infarct involving the
majority of the right MCA vascular territory. This infarct also
involves the right MCA/ACA and MCA/PCA watershed territories. As
compared to the prior CT, there is now more complete involvement of
the right basal ganglia.
2. Slightly increased mass effect with increased partial effacement
of the right lateral ventricle and now 3-4 mm leftward midline
shift.
3. Unchanged small-volume subarachnoid hemorrhage within the right
MCA cistern, sylvian fissure and along the right temporoparietal
lobes.
4. Additional subcentimeter acute infarcts within the medial right
temporal lobe (PCA vascular territory) and right cerebellar
hemisphere.
5. Stable background mild chronic small vessel ischemic disease.
6. Small chronic infarcts within the cerebellar hemispheres
bilaterally.

MRA head:

1. The M1 segment of the right middle cerebral artery is now
occluded at its proximal aspect (previously occluded at its distal
aspect). There is no appreciable flow-related signal within right
MCA vessels more distally.
2. Elsewhere, no intracranial large vessel occlusion or proximal
high-grade arterial stenosis is identified.

MRA neck:

The common carotid, internal carotid and vertebral arteries are
patent within the neck without hemodynamically significant stenosis.

## 2020-04-03 IMAGING — MR MR MRA HEAD W/O CM
4 series · 16 of 48 positions shown · non-contrast
Comparison: Noncontrast head CT [DATE]. Noncontrast head CT and
CT angiogram head/neck performed [DATE]

CLINICAL DATA: Neuro deficit, acute, stroke suspected.



[Series 3: ax (id) · axial · 1.0mm · 0.43mm/px · z∈[+28,+108]mm · 11 of 176 slices shown (1 of 2)]
[im 8/176]
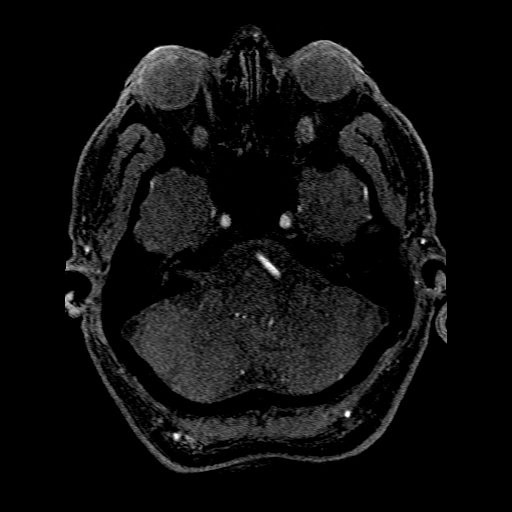
[im 24/176]
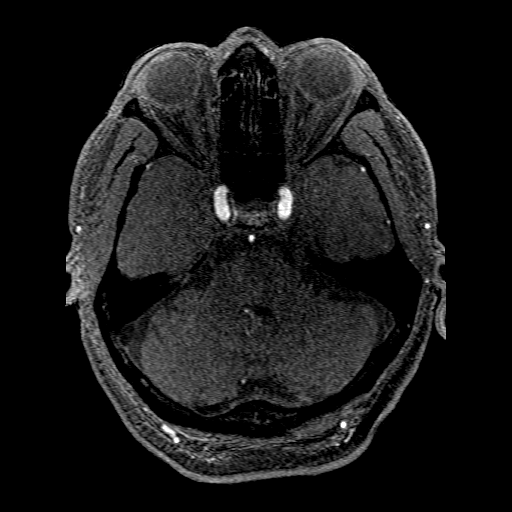
[im 32/176]
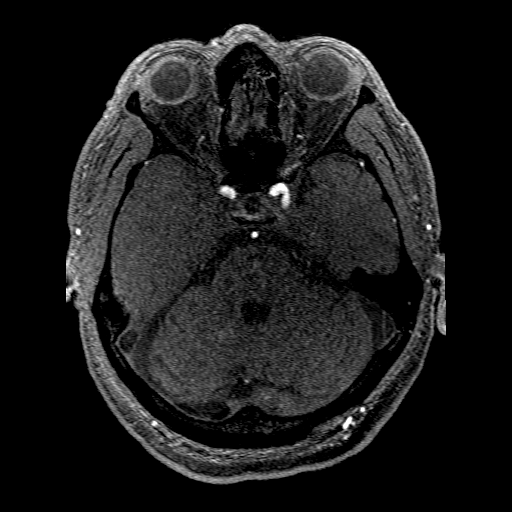
[im 56/176]
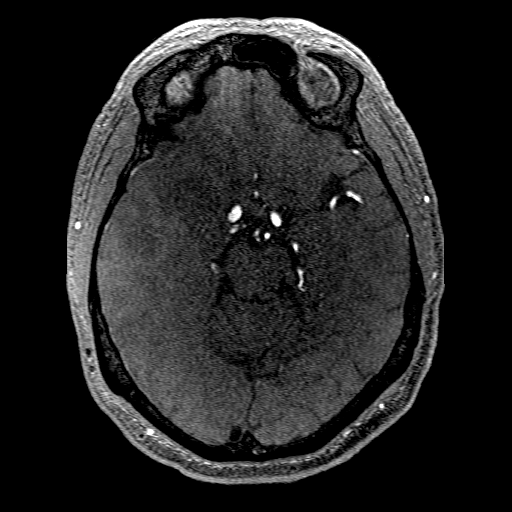
[im 80/176]
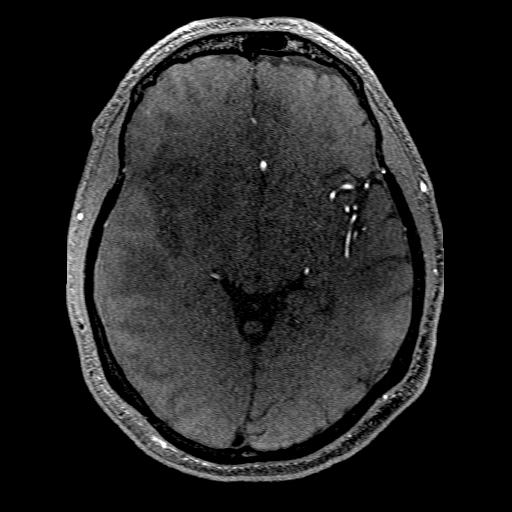
[im 88/176]
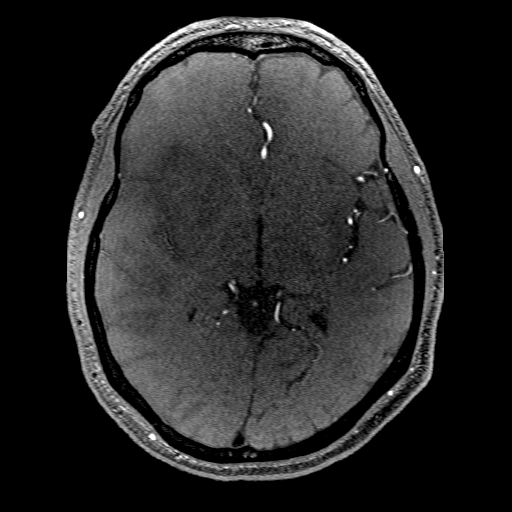
[im 96/176]
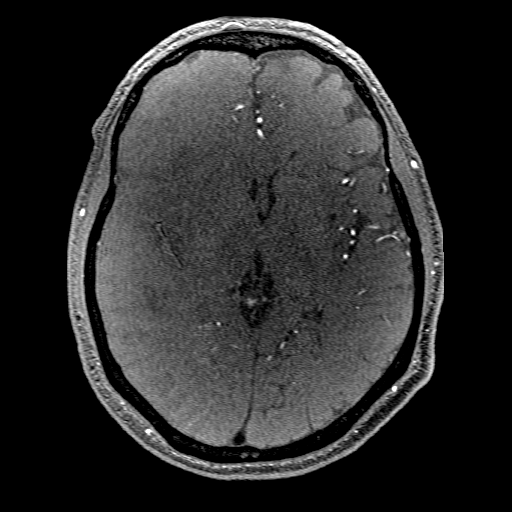
[im 120/176]
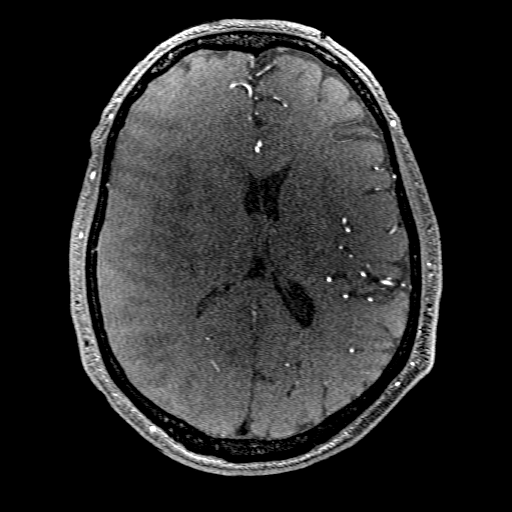
[im 144/176]
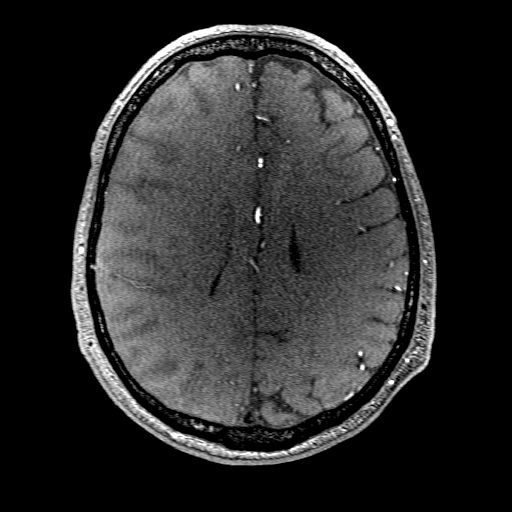
[im 152/176]
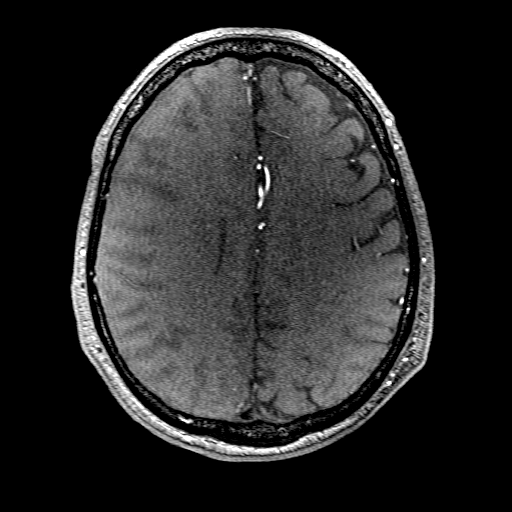
[im 168/176]
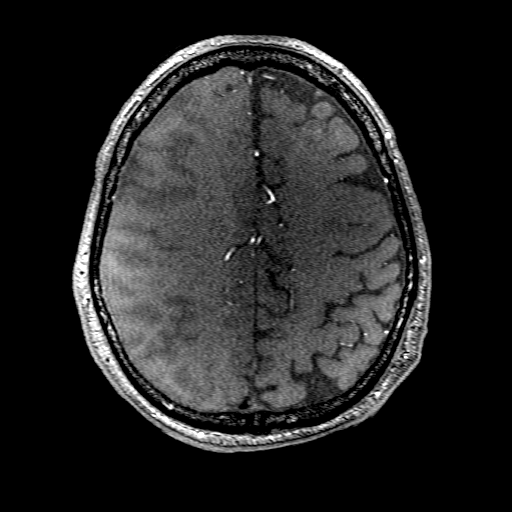

[Series 5: ax (id) · axial · 1.0mm · 0.43mm/px · z∈[+10,+74]mm · 3 of 176 slices shown (2 of 2)]
[im 24/176]
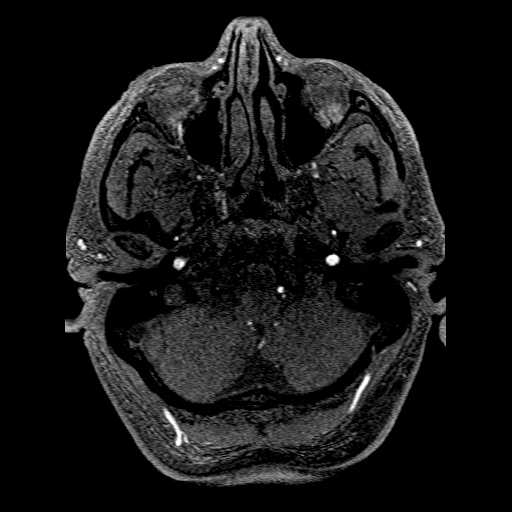
[im 88/176]
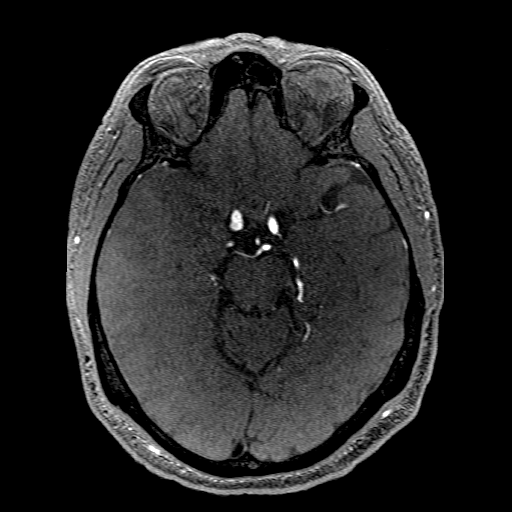
[im 152/176]
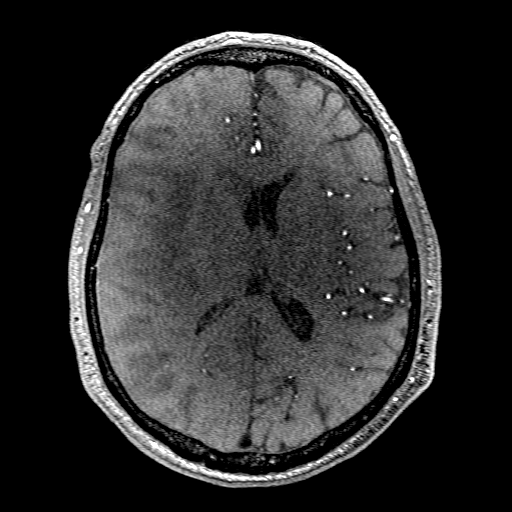

[Series 301: pjn:ax (id) · sagittal · 1.0mm · 0.43mm/px · 1 of 5 slices shown (1 of 2)]
[im 1/5]
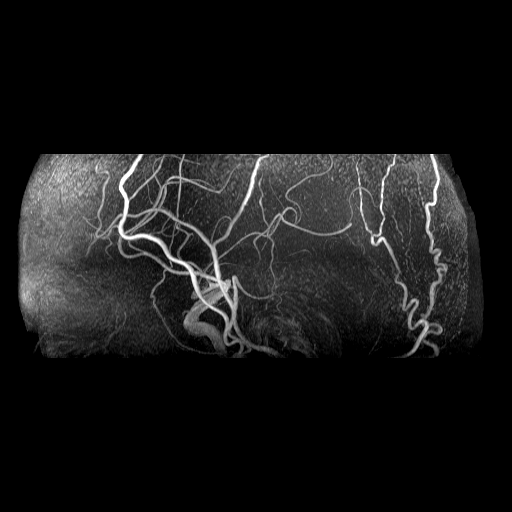

[Series 501: pjn:ax (id) · sagittal · 1.0mm · 0.43mm/px · 1 of 5 slices shown (2 of 2)]
[im 1/5]
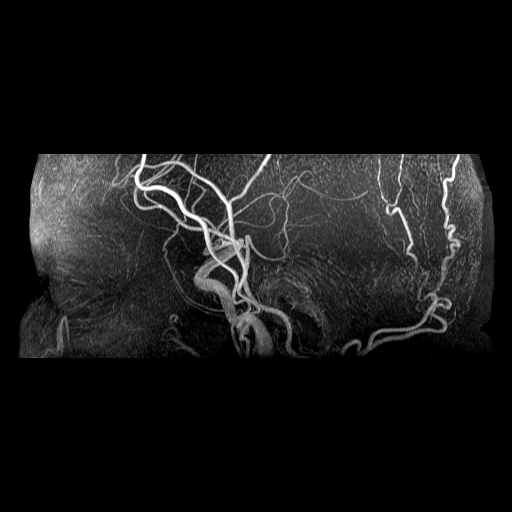

[16 of 48 positions shown; findings below may reference images not displayed]

FINDINGS: MRI HEAD FINDINGS

Brain:

A routine coronal T2 weighted sequence was not obtained.

Cerebral volume is normal for age.

Further progressed from the prior head CT of [DATE], there is an
acute/early subacute infarct involving the majority of the right MCA
vascular territory. This infarct also extends to involve the right
MCA/ACA and right MCA/PCA watershed territories. As compared to the
prior head CT, there is more complete involvement of the right basal
ganglia. Mass effect has slightly increased, with increased partial
effacement of the right lateral ventricle and now 3-4 mm leftward
midline shift.

There are additional subcentimeter acute infarcts within the right
PCA territory within the medial right temporal lobe, and also within
the right cerebellar hemisphere. Possible additional punctate acute
infarct within the paramedian right frontal lobe (ACA territory).

Redemonstrated small-volume subarachnoid hemorrhage within the right
MCA cistern, right sylvian fissure and along the right
temporoparietal lobes.

Background mild multifocal T2/FLAIR hyperintensity within the
cerebral white matter which is nonspecific, but compatible with
chronic small vessel ischemic disease.

Small chronic infarcts within the bilateral cerebellar hemispheres.

Vascular: Reported below.

Skull and upper cervical spine: No focal marrow lesion

Sinuses/Orbits: Visualized orbits show no acute finding. Trace
ethmoid sinus mucosal thickening. Small left maxillary sinus mucous
retention cyst.

MRA HEAD FINDINGS

The intracranial internal carotid arteries are patent. The M1
segment of the right middle cerebral artery is now occluded
proximally (previously occluded distally). The M1 left middle
cerebral artery is patent. No left M2 proximal branch occlusion or
high-grade proximal stenosis. The anterior cerebral arteries are
patent.

The dominant intracranial right vertebral artery is patent without
stenosis. The non dominant and developmentally diminutive left
vertebral artery is patent. The basilar artery is patent. The
posterior cerebral arteries are patent. Sizable right posterior
communicating artery. The left posterior communicating artery is
hypoplastic or absent.

No intracranial aneurysm is identified.

MRA NECK FINDINGS

The common carotid and visualized internal carotid arteries are
patent within the neck without hemodynamically significant stenosis.
Retropharyngeal course of the proximal internal carotid arteries
bilaterally.

The visualized vertebral arteries are patent within the neck without
hemodynamically significant stenosis. The right vertebral artery is
strongly dominant, and the left is developmentally diminutive.

MRI brain impressions #1, #2, #3 and #4 and MRA head impression #1
will be called to the ordering clinician or representative by the
Radiologist Assistant, and communication documented in the PACS or
[REDACTED].
IMPRESSION: MRI brain:

1. Since the prior head CT of [DATE], there has been further
progression of an acute/early subacute infarct involving the
majority of the right MCA vascular territory. This infarct also
involves the right MCA/ACA and MCA/PCA watershed territories. As
compared to the prior CT, there is now more complete involvement of
the right basal ganglia.
2. Slightly increased mass effect with increased partial effacement
of the right lateral ventricle and now 3-4 mm leftward midline
shift.
3. Unchanged small-volume subarachnoid hemorrhage within the right
MCA cistern, sylvian fissure and along the right temporoparietal
lobes.
4. Additional subcentimeter acute infarcts within the medial right
temporal lobe (PCA vascular territory) and right cerebellar
hemisphere.
5. Stable background mild chronic small vessel ischemic disease.
6. Small chronic infarcts within the cerebellar hemispheres
bilaterally.

MRA head:

1. The M1 segment of the right middle cerebral artery is now
occluded at its proximal aspect (previously occluded at its distal
aspect). There is no appreciable flow-related signal within right
MCA vessels more distally.
2. Elsewhere, no intracranial large vessel occlusion or proximal
high-grade arterial stenosis is identified.

MRA neck:

The common carotid, internal carotid and vertebral arteries are
patent within the neck without hemodynamically significant stenosis.

## 2020-04-03 MED ORDER — SODIUM CHLORIDE 3 % IV SOLN
INTRAVENOUS | Status: DC
  Filled 2020-04-03 (×3): qty 500
  Filled 2020-04-03: qty 1000
  Filled 2020-04-03 (×5): qty 500

## 2020-04-03 NOTE — Progress Notes (Signed)
I had an extensive discussion with the patient's daughter-in-law, Gregory Osborn.  We discussed that Dylen is a poor surgical candidate given the size of the stroke he has undergone as well as his age being 82.  I discussed the outcomes that are likely given his large MCA stroke.  She verbalized understanding and will discuss with the rest of her family.  She agrees at this time that surgery is not a good option given the size of the stroke he had.  I answered all of her questions.  She verbalized understanding of the severity of his disease.

## 2020-04-03 NOTE — Evaluation (Signed)
Speech Language Pathology Evaluation Patient Details Name: Gregory Osborn MRN: [id number] DOB: [birth year] Today's Date: 04/03/2020 Time: 0263-7858 SLP Time Calculation (min) (ACUTE ONLY): 33 min  Problem List:  Patient Active Problem List   Diagnosis Date Noted  . Stroke (cerebrum) (HCC) 04/02/2020   Past Medical History: No past medical history on file. HPI:  Gregory Osborn is a 75 y.o. male with a history of UTI, urinary retention, hematuria, and appendectomy. He was seen by his urologist yesterday, and was doing well. Plans were made to undergo ultrasound, and to treat for UTI. He has been doing well up until 11:25am when he was noted to develop left hemiplegia. EMS was contacted, and took him to the hospital ED where stroke workup was undertaken. He was noted to have left hemiplegia and neglect. He was not given tPA due to the hematuria. Transferred urgently for thrombectomy. 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   Assessment / Plan / Recommendation Clinical Impression  Pt demonstrates moderate to severe cognitive deficits in functional tasks given profound left negelct and visual impairment. Pt is left handed. Verbally he is able to communication with adequate language, mild dysarthria in Spanish. He can state orientation to self, place and situation, and has quite a good short term memory of recent events. He is distracted by pain and does not like to sustain eyes open for very long given his report of nausea with visual effort. His emergent and anticipatory awreness in functional tasks is absent and he is likely to have severe safety awareness impairment as he becomes more active. Recommend CIR at d/c. SLP will continue acute efforts.    SLP Assessment  SLP Recommendation/Assessment: Patient needs continued Speech Lanaguage Pathology Services SLP Visit Diagnosis: Dysphagia, oropharyngeal phase (R13.12)    Follow Up  Recommendations  Inpatient Rehab    Frequency and Duration min 2x/week  2 weeks      SLP Evaluation Cognition  Overall Cognitive Status: Impaired/Different from baseline Arousal/Alertness: Awake/alert Orientation Level: Oriented to person;Oriented to place;Oriented to situation Attention: Focused;Sustained Focused Attention: Appears intact Sustained Attention: Impaired Sustained Attention Impairment: Verbal basic;Functional basic Awareness: Impaired Awareness Impairment: Emergent impairment;Anticipatory impairment Problem Solving: Impaired Problem Solving Impairment: Functional basic Safety/Judgment: Impaired       Comprehension  Auditory Comprehension Overall Auditory Comprehension: Impaired (in function tasks addressing L/R discrimination) Yes/No Questions: Within Functional Limits Commands: Impaired One Step Basic Commands: 75-100% accurate Other Commands Comment: errors with l/r awareness Interfering Components: Visual impairments;Pain Reading Comprehension Reading Status: Not tested    Expression Verbal Expression Overall Verbal Expression: Appears within functional limits for tasks assessed Written Expression Dominant Hand: Left Written Expression: Not tested   Oral / Motor  Oral Motor/Sensory Function Overall Oral Motor/Sensory Function: Moderate impairment Facial ROM: Reduced left;Suspected CN VII (facial) dysfunction Facial Symmetry: Abnormal symmetry left;Suspected CN VII (facial) dysfunction Facial Strength: Reduced left;Suspected CN VII (facial) dysfunction Facial Sensation: Suspected CN V (Trigeminal) dysfunction;Reduced left Lingual ROM: Reduced left;Suspected CN XII (hypoglossal) dysfunction Lingual Symmetry: Abnormal symmetry left;Suspected CN XII (hypoglossal) dysfunction Lingual Strength: Reduced;Suspected CN XII (hypoglossal) dysfunction Lingual Sensation: Reduced;Suspected CN VII (facial) dysfunction-anterior 2/3 tongue Motor Speech Overall Motor  Speech: Impaired Respiration: Within functional limits Phonation: Normal Resonance: Within functional limits Articulation: Impaired Level of Impairment: Conversation Intelligibility: Intelligible Motor Planning: Witnin functional limits   GO                    Breeanne Oblinger, [name]  04/03/2020, 11:05 AM

## 2020-04-03 NOTE — Consult Note (Signed)
   Providing Compassionate, Quality Care - Together  Neurosurgery Consult  Referring physician: Dr. Pearlean Brownie Reason for referral: CVA  Chief Complaint: Hemiplegia  History of Present Illness: This is a 75 yo M with a history of urinary retention, hematuria appendectomy that presented yesterday with acute left-sided weakness.  His last well-known normal was 11:30 AM.  He was taken to the ED and after stroke work-up found to have a right M1 occlusion.  Thrombectomy was attempted, he was not given TPA due to hematuria.  Unfortunately recanalization was not able to be obtained during thrombectomy.  MRI was performed today which showed increasing midline shift to approximately 4 mm therefore I was consulted for possible hemicraniectomy.  Patient is currently on hypertonic saline at 75 cc/h.   Medications: I have reviewed the patient's current medications. Allergies: No Known Allergies  History reviewed. No pertinent family history. Social History:  has no history on file for tobacco use, alcohol use, and drug use.  ROS: Unable to obtain  Physical Exam:  Vital signs in last 24 hours: Temp:  [98 F (36.7 C)-98.3 F (36.8 C)] 98 F (36.7 C) (07/25 1814) Pulse Rate:  [58-128] 65 (07/26 0746) Resp:  [11-18] 14 (07/26 0217) BP: (138-182)/(65-125) 153/88 (07/26 0700) SpO2:  [91 %-98 %] 96 % (07/26 0746) PE: Spanish-speaking Opens eyes to voice Right preferred gaze, pupils equally round reactive to light Follows commands right upper/lower extremity Left hemiplegia Left facial droop  Imaging: CT brain and MRI brain reviewed.  Increased MCA distribution stroke noted on MRI performed today.  Complete MCA territory infarct includes basal ganglia, there is midline shift of 4 mm.  There is a stable small subarachnoid hemorrhage that appears similar to the CT brain post procedure.  Basal cisterns remain patent.  Impression/Assessment:  75 year old male with  1.  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  Cerebral edema, due to#1, with midline shift  Plan:  -Recommend maximal medical therapy, discussed with the neurology team -PICC line pending for increased hypertonic's, mannitol available if continued signs of lethargy/intracranial hypertension -I do not recommend a hemicraniectomy at this time nor do I believe the patient is a good candidate for such surgery given his age of 34 and the full distribution of the MCA infarct.  I attempted to reach the son at 7:38 PM without an answer.  I will attempt to call him again and discuss. -Continue supportive care -Recommend sodium goal 145-155   Thank you for allowing me to participate in this patient's care.  Please do not hesitate to call with questions or concerns.   Monia Pouch, DO Neurosurgeon [facility] Cell: [phone]

## 2020-04-03 NOTE — Evaluation (Signed)
Clinical/Bedside Swallow Evaluation Patient Details  Name: Gregory Osborn MRN: [id number] Date of Birth: [birth year]  Today's Date: 04/03/2020 Time: SLP Start Time (ACUTE ONLY): 1015 SLP Stop Time (ACUTE ONLY): 1048 SLP Time Calculation (min) (ACUTE ONLY): 33 min  HPI:  Gregory Osborn is a 75 y.o. male with a history of UTI, urinary retention, hematuria, and appendectomy. He was seen by his urologist yesterday, and was doing well. Plans were made to undergo ultrasound, and to treat for UTI. He has been doing well up until 11:25am when he was noted to develop left hemiplegia. EMS was contacted, and took him to the hospital ED where stroke workup was undertaken. He was noted to have left hemiplegia and neglect. He was not given tPA due to the hematuria. Transferred urgently for thrombectomy. 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   Assessment / Plan / Recommendation Clinical Impression  Pt presents with moderate oral deficits given left CN XII and VII weakness and likely sensory component. Pt has severe inattention to left and 100% multimodal cueing needed to elict attention to left sid eof mouth to clear residue. Pt does not demonstrate immediate coughing with water or puree, but given severity of ora deficits and potential for sensory impairment, instrumental assessment warranted prior to diet initiation. Pt complains of significant pain and headache. May benefit from waiting 24 hours for MBS for better participation. SLP Visit Diagnosis: Dysphagia, oropharyngeal phase (R13.12)    Aspiration Risk  Moderate aspiration risk    Diet Recommendation NPO   Medication Administration: Via alternative means    Other  Recommendations Oral Care Recommendations: Oral care QID   Follow up Recommendations        Frequency and Duration            Prognosis        Swallow Study   General HPI: Gregory Osborn is a 75 y.o. male with a  history of UTI, urinary retention, hematuria, and appendectomy. He was seen by his urologist yesterday, and was doing well. Plans were made to undergo ultrasound, and to treat for UTI. He has been doing well up until 11:25am when he was noted to develop left hemiplegia. EMS was contacted, and took him to the hospital ED where stroke workup was undertaken. He was noted to have left hemiplegia and neglect. He was not given tPA due to the hematuria. Transferred urgently for thrombectomy. 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 Type of Study: Bedside Swallow Evaluation Previous Swallow Assessment: none Diet Prior to this Study: NPO Temperature Spikes Noted: No Respiratory Status: Room air History of Recent Intubation: No Behavior/Cognition: Alert;Cooperative;Requires cueing Oral Cavity Assessment: Within Functional Limits Oral Care Completed by SLP: Yes Oral Cavity - Dentition: Adequate natural dentition Vision: Impaired for self-feeding Self-Feeding Abilities: Needs assist Patient Positioning: Upright in bed Baseline Vocal Quality: Low vocal intensity Volitional Cough: Strong Volitional Swallow: Able to elicit    Oral/Motor/Sensory Function Overall Oral Motor/Sensory Function: Moderate impairment Facial ROM: Reduced left;Suspected CN VII (facial) dysfunction Facial Symmetry: Abnormal symmetry left;Suspected CN VII (facial) dysfunction Facial Strength: Reduced left;Suspected CN VII (facial) dysfunction Facial Sensation: Suspected CN V (Trigeminal) dysfunction;Reduced left Lingual ROM: Reduced left;Suspected CN XII (hypoglossal) dysfunction Lingual Symmetry: Abnormal symmetry left;Suspected CN XII (hypoglossal) dysfunction Lingual Strength: Reduced;Suspected CN XII (hypoglossal) dysfunction Lingual Sensation: Reduced;Suspected CN VII (facial) dysfunction-anterior 2/3 tongue   Ice Chips Ice chips: Impaired Oral Phase  Impairments:  Reduced lingual movement/coordination Oral Phase Functional Implications: Prolonged oral transit   Thin Liquid Thin Liquid: Impaired Presentation: Straw;Cup Oral Phase Impairments: Poor awareness of bolus;Reduced lingual movement/coordination;Reduced labial seal Oral Phase Functional Implications: Left anterior spillage;Left lateral sulci pocketing;Prolonged oral transit    Nectar Thick Nectar Thick Liquid: Not tested   Honey Thick Honey Thick Liquid: Not tested   Puree Puree: Impaired Oral Phase Impairments: Reduced lingual movement/coordination;Poor awareness of bolus Oral Phase Functional Implications: Prolonged oral transit;Left anterior spillage;Left lateral sulci pocketing   Solid     Solid: Not tested     Gregory Ditty, MA CCC-SLP  Acute Rehabilitation Services Pager [phone] Office [phone]  Gregory Osborn, [name] 04/03/2020,10:52 AM

## 2020-04-03 NOTE — Progress Notes (Signed)
Chief Complaint: Patient was seen today for follow up stroke intervention  Supervising Physician: Baldemar Lenis  Patient Status: [facility] - In-pt  Subjective: (R)MCA CVA S/p angiogram with multiple attempts at thrombectomy with only temporary recanalization and ultimate reocclusion. Pt extubated. Family at bedside.  Objective: Physical Exam: BP (!) 147/71   Pulse 76   Temp 98.6 F (37 C) (Oral)   Resp 12   Ht 5\' 5"  (1.651 m)   Wt 76.3 kg   SpO2 97%   BMI 27.99 kg/m  Awake. Tries to follow some commands but slow. Neglecting left side No movement of left upper or lower ext. Face asymmetric with right droop. (R)groin soft, NT LE/feet warm, good doppler pulses   Current Facility-Administered Medications:  .  sodium chloride 0.9 % bolus 500 mL, 500 mL, Intravenous, Once **FOLLOWED BY** 0.9 %  sodium chloride infusion, 100 mL/hr, Intravenous, Continuous, Absher, , MD, Last Rate: 100 mL/hr at 04/03/20 0700, 100 mL/hr at 04/03/20 0700 .  acetaminophen (TYLENOL) tablet 650 mg, 650 mg, Oral, Q4H PRN **OR** acetaminophen (TYLENOL) 160 MG/5ML solution 650 mg, 650 mg, Per Tube, Q4H PRN **OR** acetaminophen (TYLENOL) suppository 650 mg, 650 mg, Rectal, Q4H PRN, de 04/05/20, [place], MD .  aspirin suppository 300 mg, 300 mg, Rectal, Daily, Absher, [place], MD, 300 mg at 04/03/20 1058 .  Chlorhexidine Gluconate Cloth 2 % PADS 6 each, 6 each, Topical, Daily, Absher, 04/05/20, MD, 6 each at 04/02/20 1745 .  clevidipine (CLEVIPREX) infusion 0.5 mg/mL, 0-21 mg/hr, Intravenous, Continuous, de 04/04/20, [place], MD, Stopped at 04/03/20 0000 .  enoxaparin (LOVENOX) injection 40 mg, 40 mg, Subcutaneous, Q24H, Absher, John R, MD .  sodium chloride (hypertonic) 3 % solution, , Intravenous, Continuous, Bailey-Modzik, Delila A, NP, Last Rate: 75 mL/hr at 04/03/20 1051, New Bag at 04/03/20 1051 .  sodium chloride flush (NS) 0.9 % injection 3 mL, 3 mL, Intravenous,  Q12H, Absher, 04/05/20, MD, 3 mL at 04/02/20 2119  Labs: CBC Recent Labs    04/02/20 2208  WBC 14.8*  HGB 14.9  HCT 42.4  PLT 244   BMET Recent Labs    04/02/20 2208  NA 139  K 3.7  CL 106  CO2 21*  GLUCOSE 182*  BUN 7*  CREATININE 0.90  CALCIUM 8.5*   LFT Recent Labs    04/02/20 2208  PROT 6.5  ALBUMIN 3.4*  AST 29  ALT 18  ALKPHOS 86  BILITOT 0.8   PT/INR Recent Labs    04/02/20 2208  LABPROT 13.1  INR 1.0     Studies/Results: CT HEAD WO CONTRAST  Result Date: 04/02/2020 CLINICAL DATA:  Follow-up stroke with thrombectomy attempt. EXAM: CT HEAD WITHOUT CONTRAST TECHNIQUE: Contiguous axial images were obtained from the base of the skull through the vertex without intravenous contrast. COMPARISON:  CT studies earlier same day. FINDINGS: Brain: No acute finding affecting the brainstem or left cerebral hemisphere. On the right, there is low-density now seen throughout the right middle cerebral artery territory consistent with complete right MCA territory infarction. This includes at least partial involvement of the basal ganglia. Areas of hyperdensity in the deep insula and frontoparietal junction region could represent a combination of contrast staining and a small amount of hemorrhage. No large confluent hematoma. Mild swelling with early mass-effect, right-to-left shift of 2 mm. No hydrocephalus. No extra-axial collection. Vascular: No new vascular finding. Skull: Normal Sinuses/Orbits: Clear/normal Other: None IMPRESSION: Complete right MCA territory infarction, with  at least partial involvement of the basal ganglia. Areas of hyperdensity in the deep insula and frontoparietal junction region could represent a combination of contrast staining and a small amount of hemorrhage. No large confluent hematoma. Mild swelling with early mass-effect, right-to-left shift of 2 mm. Electronically Signed   By: Paulina Fusi M.D.   On: 04/02/2020 21:45    Assessment/Plan: (R)MCA  CVA S/p angiogram with multiple technically unsuccessful attempts at thrombectomy with ultimate reocclusion. Discussed procedure with pt family as well as expectations for recovery. Neuro IR can follow along.     LOS: 1 day   I spent a total of 20 minutes in face to face in clinical consultation, greater than 50% of which was counseling/coordinating care for CVA post intervention  Brayton El PA-C 04/03/2020 11:11 AM

## 2020-04-03 NOTE — Consult Note (Signed)
NAME:  Gregory Osborn, MRN:  [id number], DOB:  1945-07-27, LOS: 1 ADMISSION DATE:  04/02/2020, CONSULTATION DATE:  04/03/2020 REFERRING MD:  Pearlean Brownie, CHIEF COMPLAINT:  R M1 occlusion   Brief History:  75 year old male who presented to [facility] with L hemiplegia, L facial droop, slurred speech after fall. 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 TPA not given due to hematuria history. Emergently transferred to [facility] IR for thrombectomy (unsuccessful).  History of Present Illness:  Gregory Osborn is a 75 year old male with history of HTN (recently started on lisinopril), BPH, urinary retention with frequent UTIs (recently utilizing an indwelling Foley catheter at home, followed by Urology) and hematuria (likely 2/2 traumatic catheterization). Per family he was doing well overall 2/16 until 1125 when he was reportedly had a fall at home. Family found him with new onset of L-sided paralysis, L facial droop and slurred speech and called EMS. He was taken to [facility] for evaluation and diagnosed with R M1 occlusion. No TPA was administered, given patient's recent report of hematuria. He was emergently transferred to [facility] for IR thrombectomy/recanalization.   Per IR procedure note, temporary recanalization was achieved multiple times but resulted in subsequent reocclusion (8 pass attempts made). Post-procedure, patient was transferred to 4N. PCCM was consulted 2/17 in the setting of expected post-stroke edema for possible need for reintubation and ventilatory support.  Today, patient reports ongoing LUE/LLE weakness and chest discomfort (reproducible). Denies recent fever/chills, SOB/dyspnea, n/v/d, sick contacts. He has had some urinary symptoms (hesitancy, hematuria) as noted above for which he is well-established with urology.  Past Medical History:  HTN, urinary retention with frequent UTIs, BPH, hematuria  Significant Hospital Events:  2/16 Presented to [facility] with L hemiplegia/facial droop, LKW 2/16 1125. Dx with  R M1 occlusion, transferred to [facility], IR attempted thrombectomy (unsuccessful)  Consults:  PCCM  Procedures:  ETT 2/16  Significant Diagnostic Tests:   CT Head 2/16 >> complete R MCA territory infarction with at least partial involvement of the basal ganglia, areas of hyperdensity in the deep insula/frontoparietal junction could represent a combination of contrast staining and small amount of hemorrhage, no large confluent hematoma, mild swelling with early mass effect, R to L shift of 93mm  IR Percutaneous Art Thrombectomy 2/16  Micro Data:  2/16 COVID >> negative  Antimicrobials:  N/A  Interim History / Subjective:    Objective   Blood pressure (!) 147/71, pulse 76, temperature 98.6 F (37 C), temperature source Oral, resp. rate 12, height 5\' 5"  (1.651 m), weight 76.3 kg, SpO2 97 %.        Intake/Output Summary (Last 24 hours) at 04/03/2020 1213 Last data filed at 04/03/2020 1100 Gross per 24 hour  Intake 2662.18 ml  Output 2875 ml  Net -212.82 ml   Filed Weights   04/02/20 1519 04/02/20 1730  Weight: 77.1 kg 76.3 kg   Examination: General: WDWN adult male, laying in bed, in NAD. HEENT: Anicteric sclera, PERRL. Moist mucous membranes. L-sided facial droop with rightward gaze preference. Neuro: Drowsy, but oriented. Answering questions appropriately with assistance of son for interpretation. Gross L-sided neglect of LUE/LLE CV: S1S2, RRR, no m/g/r. PULM: Breathing even and unlabored on RA. Lung fields CTAB anteriorly. GI: Soft, nontender, nondistended. Extremities: No LE edema noted. Profound weakness of LUE/LLE. Skin: Warm/dry, no rashes.  Resolved Hospital Problem list    Assessment & Plan:  Right M1 occlusion with associated L hemiplegia, s/p attempted IR thrombectomy/recanalization Cerebral edema with AMS Concern for respiratory compromise Presented to [facility] (LKW 1125 2/16) after fall with L hemiplegia/deficits. 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  Subsequently transferred emergently to [facility] for IR thrombectomy/recanalization (8 attempts, unsuccessful). Concern that as post-stroke edema continues, patient may need intubation for airway protection/respiratory support. - Monitor respiratory status - PCCM available for escalation in respiratory support as needed - Hypertonic saline per Stroke team - Plan for MRI, Echo today - Swallow study today vs. Tomorrow, pending mental status  Urinary retention complicated by frequent UTI Hematuria BPH Patient with persistent urinary retention (likely associated with BPH) for which he follows with [facility]. Has required indwelling Foley in the past, initially this was removed in early February after an enterobacter UTI. Foley was replaced 2/15 for ongoing retention. Of note, patient's family reports that hematuria was scant and associated with catheter placement; UA without +Hgb. - Maintain Foley - Routine foley care - Resume home BPH meds when clinically appropriate  Hypertension Patient has a history of mild hypertension. Per family, patient was recently started on low-dose lisinopril (5mg ) but did not take anything prior to this. - Hold home BP meds for now - Per Stroke team, permissive HTN (goal < 220/120) with gradual normalization over 5-7 days  Best practice (evaluated daily)  Diet: NPO, swallow study pending Pain/Anxiety/Delirium protocol (if indicated): N/A VAP protocol (if indicated): N/A DVT prophylaxis: Lovenox GI prophylaxis: N/A Glucose control: SSI PRN Mobility: Bedrest Disposition: ICU  Goals of Care:  Last date of multidisciplinary goals of care discussion: Per primary Family and staff present:  Summary of discussion:  Follow up goals of care discussion due: 2/23 Code Status: Full  Labs   CBC: Recent Labs  Lab 04/02/20 2208  WBC 14.8*  NEUTROABS 14.0*  HGB 14.9  HCT 42.4  MCV 84.8  PLT 244    Basic Metabolic Panel: Recent Labs  Lab 04/02/20 2208  04/03/20 1005  NA 139 141  K 3.7 3.6  CL 106 108  CO2 21* 23  GLUCOSE 182* 125*  BUN 7* 8  CREATININE 0.90 0.93  CALCIUM 8.5* 8.8*   GFR: Estimated Creatinine Clearance: 68.4 mL/min (by C-G formula based on SCr of 0.93 mg/dL). Recent Labs  Lab 04/02/20 2208  WBC 14.8*    Liver Function Tests: Recent Labs  Lab 04/02/20 2208  AST 29  ALT 18  ALKPHOS 86  BILITOT 0.8  PROT 6.5  ALBUMIN 3.4*   No results for input(s): LIPASE, AMYLASE in the last 168 hours. No results for input(s): AMMONIA in the last 168 hours.  ABG No results found for: PHART, PCO2ART, PO2ART, HCO3, TCO2, ACIDBASEDEF, O2SAT   Coagulation Profile: Recent Labs  Lab 04/02/20 2208  INR 1.0    Cardiac Enzymes: No results for input(s): CKTOTAL, CKMB, CKMBINDEX, TROPONINI in the last 168 hours.  HbA1C: Hgb A1c MFr Bld  Date/Time Value Ref Range Status  04/03/2020 10:05 AM 5.6 4.8 - 5.6 % Final    Comment:    (NOTE) Pre diabetes:          5.7%-6.4%  Diabetes:              >6.4%  Glycemic control for   <7.0% adults with diabetes     CBG: Recent Labs  Lab 04/02/20 2006  GLUCAP 176*   Review of Systems:   Systems reviewed and otherwise negative unless noted in above HPI.  Past Medical History:  HTN, urinary retention with frequent UTI, BPH, hematuria  Surgical History:    Social History:  Family History:  His family history is not on file.   Allergies No Known Allergies   Home Medications  Prior to Admission medications   Medication Sig Start Date End Date Taking? Authorizing Provider  acetaminophen (TYLENOL) 500 MG tablet Take 1,000 mg by mouth every 6 (six) hours as needed for mild pain.   Yes [provider]  alfuzosin (UROXATRAL) 10 MG 24 hr tablet Take 10 mg by mouth daily. 03/13/20  Yes [provider]  ibuprofen (ADVIL) 200 MG tablet Take 400 mg by mouth every 6 (six) hours as needed for mild pain.   Yes [provider]  lisinopril  (ZESTRIL) 5 MG tablet Take 5 mg by mouth daily. 02/27/20  Yes [provider]  Multiple Vitamins-Minerals (MULTI FOR HIM 50+) TABS Take 1 tablet by mouth daily.   Yes [provider]     Critical care time:    Faythe Ghee [facility] 04/03/20 12:13 PM  Please see [email or web address] for pager details.

## 2020-04-03 NOTE — Progress Notes (Signed)
STROKE TEAM PROGRESS NOTE   INTERVAL HISTORY No acute events overnight    Daughter and son at bedside. Family discussion with interpretor present and assisting. Stroke diagnosis, prognosis and plan of care explained. Brain imaging reviewed. Goals of care, code status and possible complications also discussed.  Many questions answered.  Son and daughter report they want everything we can do done for their father including full code status and intubation/ventilation should he decline. They would also agree to surgical intervention should it become necessary. Patient did not appear to be attentive or engage with team members or family during discussion.  Vitals:   04/03/20 0500 04/03/20 0600 04/03/20 0700 04/03/20 0800  BP: (!) 147/69 (!) 144/67 138/70   Pulse: 91 66 75   Resp: 19 16 15    Temp:    98.6 F (37 C)  TempSrc:    Oral  SpO2: 95% 96% 97%   Weight:      Height:       CBC:  Recent Labs  Lab 04/02/20 2208  WBC 14.8*  NEUTROABS 14.0*  HGB 14.9  HCT 42.4  MCV 84.8  PLT 244   Basic Metabolic Panel:  Recent Labs  Lab 04/02/20 2208  NA 139  K 3.7  CL 106  CO2 21*  GLUCOSE 182*  BUN 7*  CREATININE 0.90  CALCIUM 8.5*   Lipid Panel: No results for input(s): CHOL, TRIG, HDL, CHOLHDL, VLDL, LDLCALC in the last 168 hours. HgbA1c: No results for input(s): HGBA1C in the last 168 hours. Urine Drug Screen:  Recent Labs  Lab 04/02/20 1809  LABOPIA NONE DETECTED  COCAINSCRNUR NONE DETECTED  LABBENZ NONE DETECTED  AMPHETMU NONE DETECTED  THCU NONE DETECTED  LABBARB NONE DETECTED    Alcohol Level  Recent Labs  Lab 04/02/20 2208  ETH <10    IMAGING past 24 hours CT HEAD WO CONTRAST  Result Date: 04/02/2020 CLINICAL DATA:  Follow-up stroke with thrombectomy attempt. EXAM: CT HEAD WITHOUT CONTRAST TECHNIQUE: Contiguous axial images were obtained from the base of the skull through the vertex without intravenous contrast. COMPARISON:  CT studies earlier same day.  FINDINGS: Brain: No acute finding affecting the brainstem or left cerebral hemisphere. On the right, there is low-density now seen throughout the right middle cerebral artery territory consistent with complete right MCA territory infarction. This includes at least partial involvement of the basal ganglia. Areas of hyperdensity in the deep insula and frontoparietal junction region could represent a combination of contrast staining and a small amount of hemorrhage. No large confluent hematoma. Mild swelling with early mass-effect, right-to-left shift of 2 mm. No hydrocephalus. No extra-axial collection. Vascular: No new vascular finding. Skull: Normal Sinuses/Orbits: Clear/normal Other: None IMPRESSION: Complete right MCA territory infarction, with at least partial involvement of the basal ganglia. Areas of hyperdensity in the deep insula and frontoparietal junction region could represent a combination of contrast staining and a small amount of hemorrhage. No large confluent hematoma. Mild swelling with early mass-effect, right-to-left shift of 2 mm. Electronically Signed   By: 04/04/2020 M.D.   On: 04/02/2020 21:45   PHYSICAL EXAM   Temp:  [98 F (36.7 C)-98.9 F (37.2 C)] 98.6 F (37 C) (02/17 0800) Pulse Rate:  [66-91] 75 (02/17 0700) Resp:  [11-20] 15 (02/17 0700) BP: (121-151)/(58-86) 138/70 (02/17 0700) SpO2:  [95 %-100 %] 97 % (02/17 0700) Weight:  [76.3 kg-77.1 kg] 76.3 kg (02/16 1730)  General - Well nourished, well developed elderly Hispanic male, in no apparent  distress.   Ophthalmologic - fundi not visualized due to noncooperation.  Cardiovascular - Regular rhythm and rate   Mental Status -  Drowsy but can be easily aroused with left sided neglect and intattention. Able to state he is in hospital and name month.  Speech appears clear Cranial Nerves II - XII - II -  No blink to threat on left but blinks on the right III, IV, VI - Gaze does not pass midline to the left with fixed  right gaze deviation V - Facial sensation intact bilaterally  VII -mild left lower facial weakness.  VIII - Hearing intact to voice  X - Palate elevates symmetrically . XI - Chin turning & shoulder shrug intact bilaterally  XII - Tongue protrusion intact   Motor Strength -  Dense left hemiplegia with hypotonia particular in the left upper extremity.  Normal strength on the right. Withdraws to noxious stimuli LLE  Reflexes -depressed on the left and normal on the right  Sensory - Light touch, temperature/pinprick were assessed and were symmetrical     Coordination - left plegia   Gait and Station - deferred.  ASSESSMENT/PLAN Gregory Osborn is a 75 y.o. male with a history of recent urologic issues including UTI (01/22), urinary retention with foley cath placed, hematuria (likely traumatic due to self cath) s/p cystoscopy 04/01/20 by Dr. Pete Glatter [facility])  showing BPH with recommended surgical treatment. Surgical history includes appendectomy.He had been doing well up until 11:25am when he was noted to develop left hemiplegia. EMS was contacted, and took him to the [facility] ED where stroke workup was undertaken. He was noted to have left hemiplegia and neglect. NIHSS score 15.  He was not given tPA due to the hematuria. Transferred urgently to [facility] for thrombectomy which was aborted after multiple attempts which were unsuccessful.   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  MRA of the neck shows no significant extracranial stenosis MR Brain pending   2D Echo pending  LDL pending  HgbA1c 5.6  VTE prophylaxis     Diet   Diet NPO time specified   Swallow study pending.  On ASA 300mg  by suppository daily pending swallow eval  Therapy  recommendations:  Pending  Disposition:  Likely will need IPR, TBD  Lengthy interpreter assisHypertonic saline per protocol initiated for cerebral edema at 75cc/hr. ted session with daughter and son for planning care. Continue full code status and full measures.    Cerebral Edema  Critical care medicine team consulted.  Hypertonic saline at 75cc/hr per protocol with serum sodium goal 150-155  MRI brain today pending  Family wants full aggressive measures including hemicraniectomy if necessary  Hypertension  Home meds:  Lisinopril 5mg  daily   BP 121-151/58-86, Cleviprex weaned off around midnight . Permissive hypertension (OK if < 220/120) but gradually normalize in 5-7 days . Long-term BP goal normotensive  Lipid status:   No results on file.   Lipid panel is Pending   Other Stroke Risk Factors   ETOH use, alcohol level <10, family reports he drinks regularly but not a problem.    Patient advised to stop using due to stroke risk.  Other Active Problems   Urinary retention d/t BPH present on admission  Developed urinary retention and UTI about a month ago  necessitating trial of intermittent self cath (failed) and then subsequent foley placement  S/p cystoscopy 2/15 with recommended surgical intervention for BPH by Dr. Pete Glatter [facility])  Hematuria appears transient after attempts at self cath per urology notes review. Hematuria not present on on UA yesterday.   Continue foley catheter, Monitor for hematuria   Hospital day # 1 I have personally obtained history,examined this patient, reviewed notes, independently viewed imaging studies, participated in medical decision making and plan of care.ROS completed by me personally and pertinent positives fully documented  I have made any additions or clarifications directly to the above note. Agree with note above. . Patient presented with right M1 occlusion and right MCA infarct in unfortunately underwent unsuccessful  thrombectomy with follow-up scan showing significant large right MCA infarct with cytotoxic edema and now developing midline shift.  Patient appears to be at significant risk for malignant cerebral edema given the MRI findings of complete M1 occlusion in 3 to 4 mm of right-to-left shift.  I had a long discussion with the patient's son and daughter at the bedside using Spanish language interpreter about his likely progressive neurological decline and need for intubation and even possibly hemicraniectomy to control cerebral edema and to save his life.  The family is quite clear that they want aggressive measures including hemicraniectomy if necessary.  Continue hypertonic saline with serum sodium goal 150-155.  Have consulted critical care medicine will follow closely and intubated as needed. This patient is critically ill and at significant risk of neurological worsening, death and care requires constant monitoring of vital signs, hemodynamics,respiratory and cardiac monitoring, extensive review of multiple databases, frequent neurological assessment, discussion with family, other specialists and medical decision making of high complexity.I have made any additions or clarifications directly to the above note.This critical care time does not reflect procedure time, or teaching time or supervisory time of PA/NP/Med Resident etc but could involve care discussion time.  Discussed with Dr. Jake Samples about need for possible hemicraniectomy   I spent 60 minutes of neurocritical care time  in the care of  this patient.     Delia Heady, MD Medical Director [facility] Stroke Center Pager: [phone] 04/03/2020 6:13 PM   To contact Stroke Continuity provider, please refer to [email or web address]. After hours, contact General Neurology

## 2020-04-04 ENCOUNTER — Inpatient Hospital Stay (HOSPITAL_COMMUNITY): Payer: Medicare HMO

## 2020-04-04 DIAGNOSIS — I6389 Other cerebral infarction: Secondary | ICD-10-CM

## 2020-04-04 DIAGNOSIS — I63311 Cerebral infarction due to thrombosis of right middle cerebral artery: Secondary | ICD-10-CM | POA: Diagnosis not present

## 2020-04-04 DIAGNOSIS — I63011 Cerebral infarction due to thrombosis of right vertebral artery: Secondary | ICD-10-CM | POA: Diagnosis not present

## 2020-04-04 LAB — BASIC METABOLIC PANEL
Anion gap: 8 (ref 5–15)
BUN: 9 mg/dL (ref 8–23)
CO2: 24 mmol/L (ref 22–32)
Calcium: 8.6 mg/dL — ABNORMAL LOW (ref 8.9–10.3)
Chloride: 113 mmol/L — ABNORMAL HIGH (ref 98–111)
Creatinine, Ser: 0.88 mg/dL (ref 0.61–1.24)
GFR, Estimated: >60 mL/min (ref >60)
Glucose, Bld: 123 mg/dL — ABNORMAL HIGH (ref 70–99)
Potassium: 3.3 mmol/L — ABNORMAL LOW (ref 3.5–5.1)
Sodium: 145 mmol/L (ref 135–145)

## 2020-04-04 LAB — ECHOCARDIOGRAM COMPLETE
Area-P 1/2: 2.73 cm2
Height: 65 in
S' Lateral: 3.4 cm
Weight: 2691.38 oz

## 2020-04-04 LAB — SODIUM
Sodium: 148 mmol/L — ABNORMAL HIGH (ref 135–145)
Sodium: 145 mmol/L (ref 135–145)
Sodium: 146 mmol/L — ABNORMAL HIGH (ref 135–145)

## 2020-04-04 IMAGING — CT CT HEAD W/O CM
4 series · 17 of 47 positions shown, 19 images · non-contrast
Comparison: Yesterday

CLINICAL DATA: Stroke follow-up

EXAM:
CT HEAD WITHOUT CONTRAST
TECHNIQUE: Contiguous axial images were obtained from the base of the skull
through the vertex without intravenous contrast.

[Series 3: head without · axial · non-contrast · 0.45mm/px · z∈[-122,+3]mm · 7 of 35 slices shown, 9 images]
[im 5/35  brain]
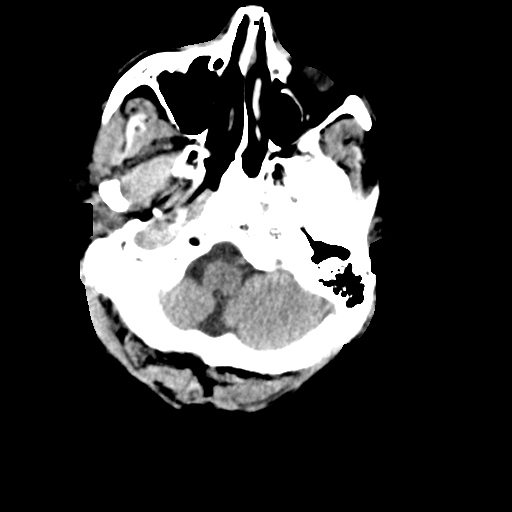
[im 5/35  bone]
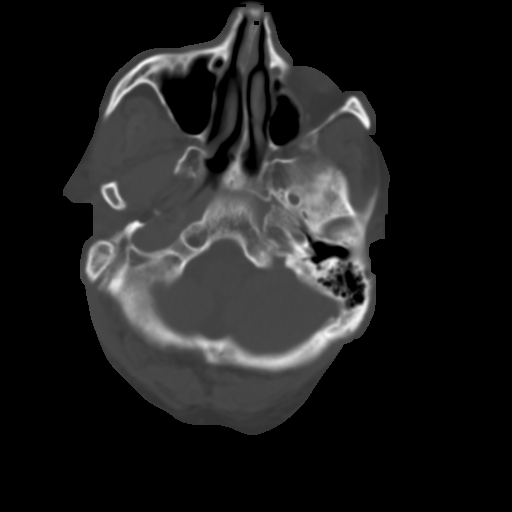
[im 9/35  brain]
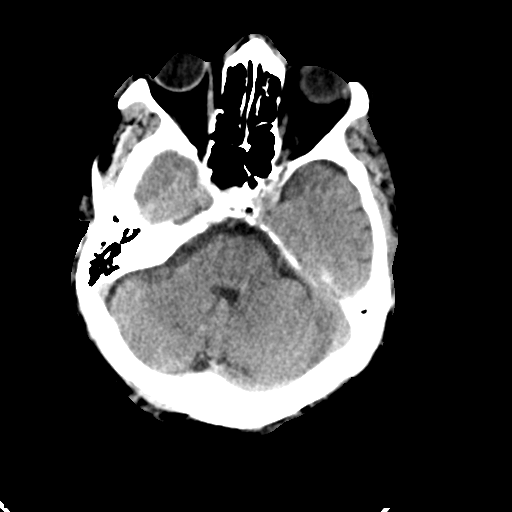
[im 13/35  brain]
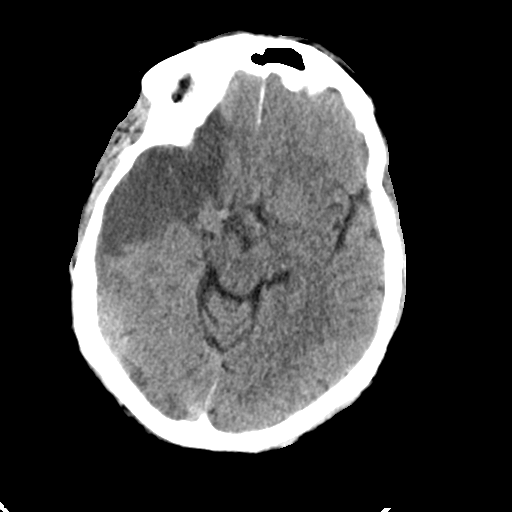
[im 18/35  brain]
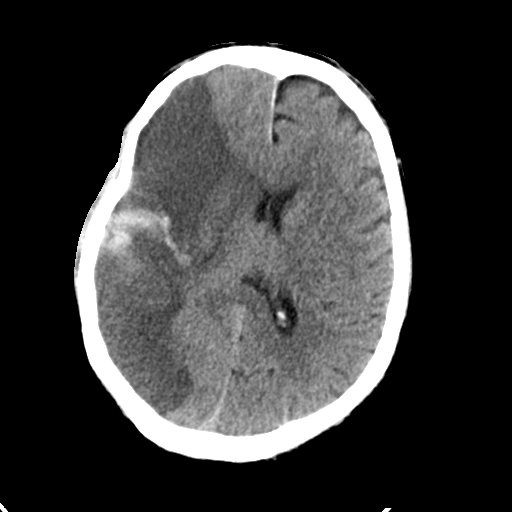
[im 22/35  brain]
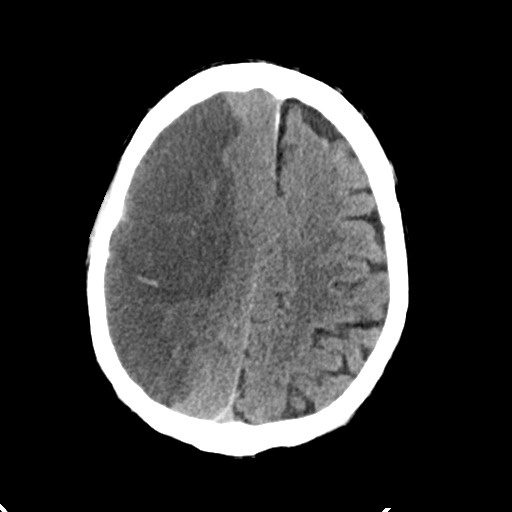
[im 22/35  bone]
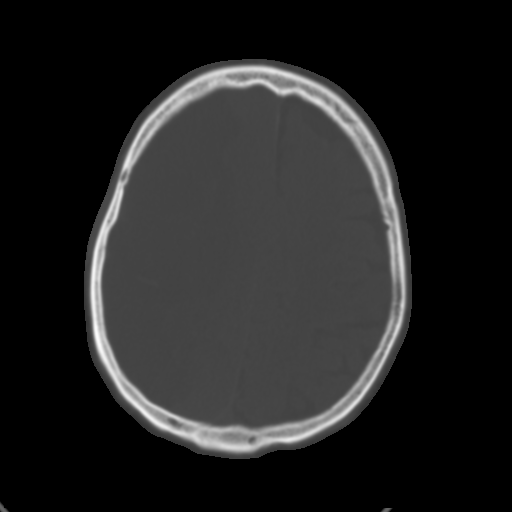
[im 26/35  brain]
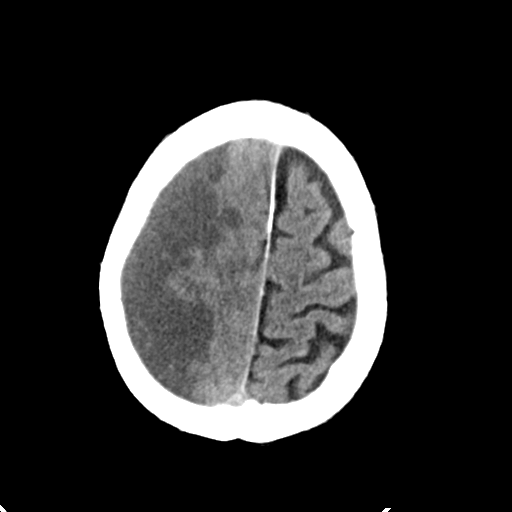
[im 30/35  brain]
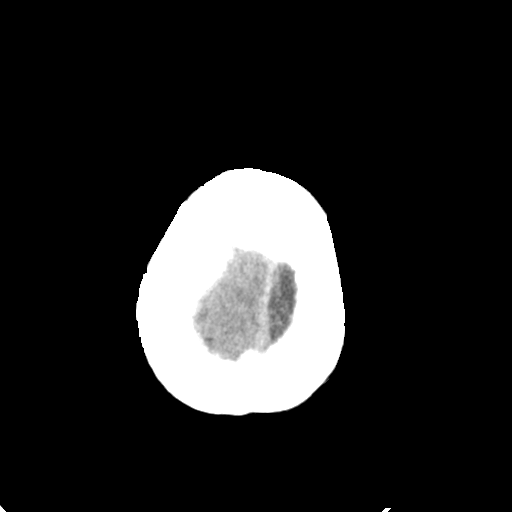

[Series 4: head bone · axial · 0.45mm/px · z∈[-126,-66]mm · 4 of 87 slices shown]
[im 9/87  bone]
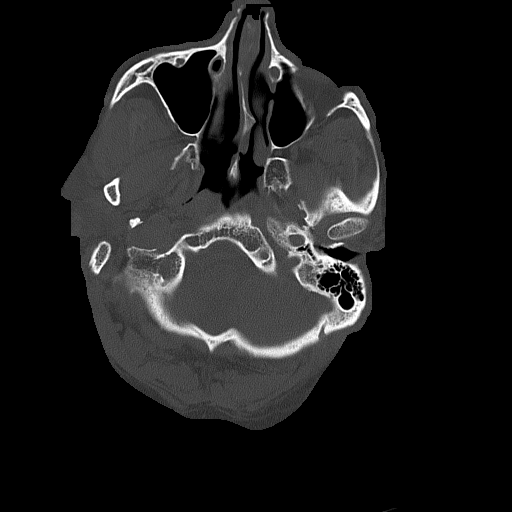
[im 18/87  bone]
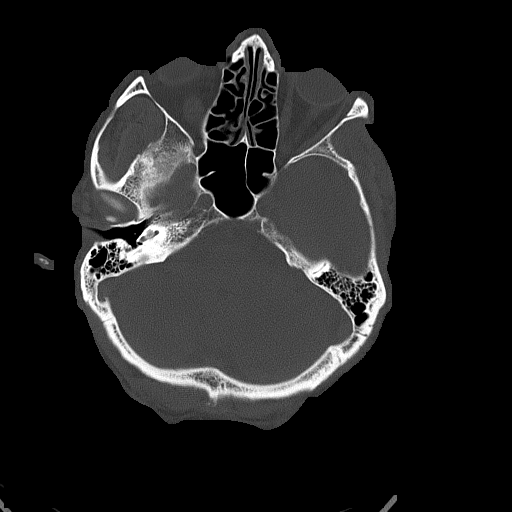
[im 26/87  bone]
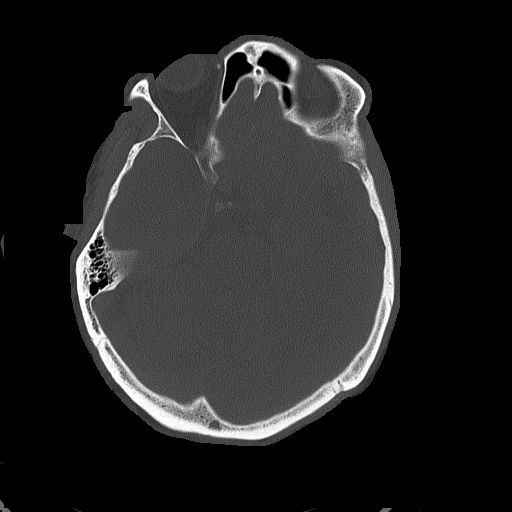
[im 39/87  bone]
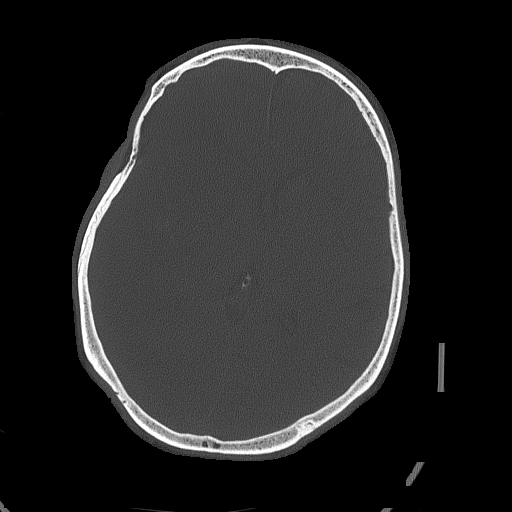

[Series 5: head without cor · coronal · non-contrast · 0.34mm/px · 3 of 67 slices shown]
[im 23/67  brain]
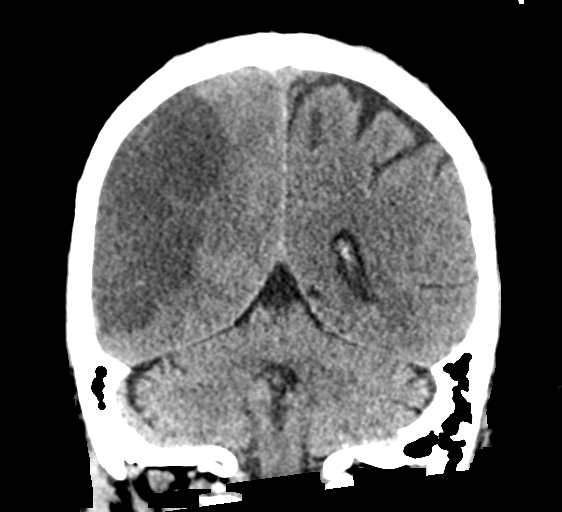
[im 30/67  brain]
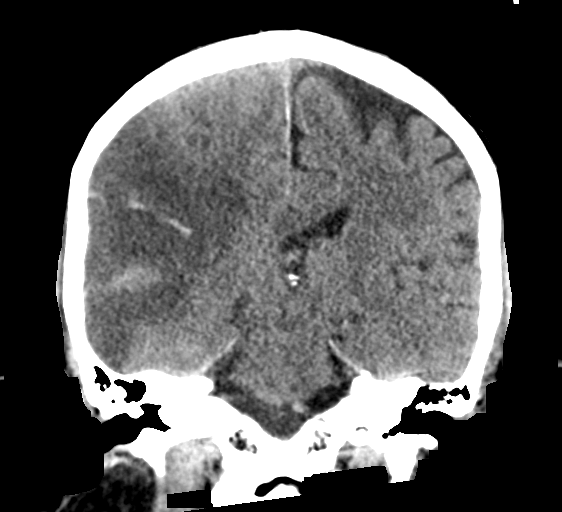
[im 37/67  brain]
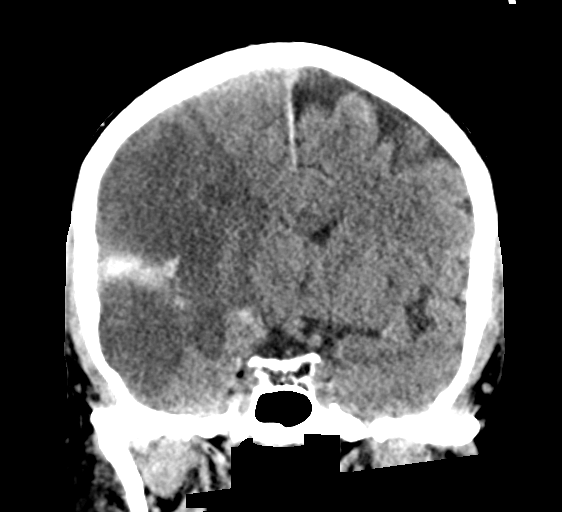

[Series 6: head without sag · sagittal · non-contrast · 0.34mm/px · 3 of 56 slices shown]
[im 19/56  brain]
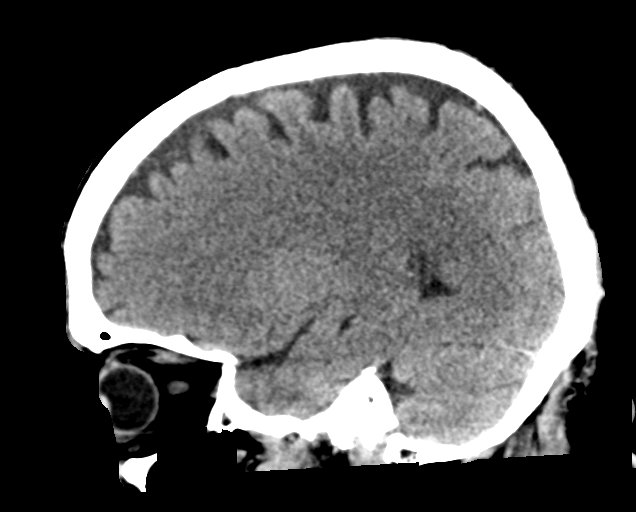
[im 28/56  brain]
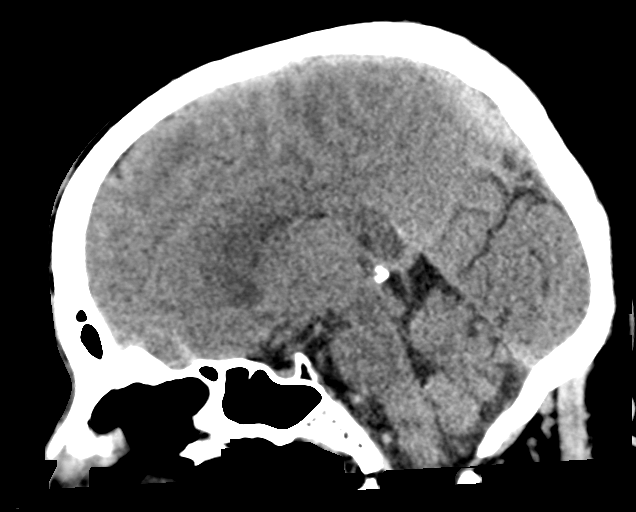
[im 37/56  brain]
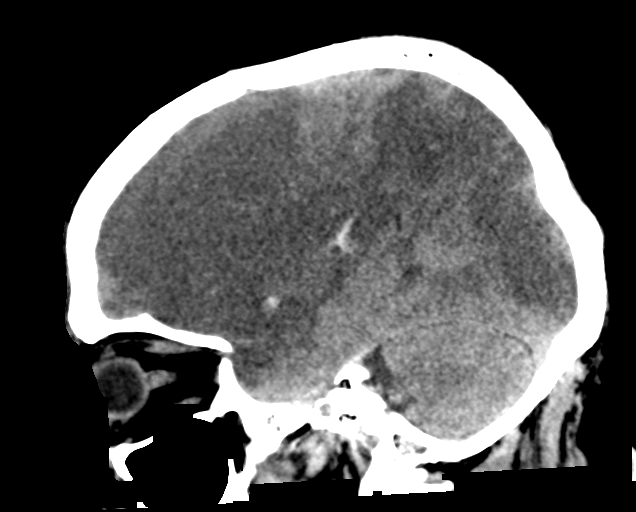

[17 of 47 positions shown; findings below may reference images not displayed]

FINDINGS: Brain: Progressively distinct cytotoxic edema in the right MCA
territory. Primarily subarachnoid hemorrhage along the mid right
sylvian fissure is non progressed. With progressive swelling there
is new midline shift which measures 6 mm no entrapment. No evidence
of new infarct.

Vascular: High-density right MCA.

Skull: Normal. Negative for fracture or focal lesion.

Sinuses/Orbits: No acute finding.
IMPRESSION: Progressive cytotoxic edema from right MCA infarction with new 6 mm
of midline shift. Non progressed hemorrhage.

## 2020-04-04 MED ORDER — ATORVASTATIN CALCIUM 40 MG PO TABS
40.0000 mg | ORAL_TABLET | Freq: Every day | ORAL | Status: DC
  Filled 2020-04-04: qty 1

## 2020-04-04 MED ORDER — POTASSIUM CHLORIDE 10 MEQ/100ML IV SOLN
10.0000 meq | INTRAVENOUS | Status: AC
  Administered 2020-04-04 (×6): 10 meq via INTRAVENOUS
  Filled 2020-04-04 (×7): qty 100

## 2020-04-04 MED ORDER — ACETAMINOPHEN 325 MG PO TABS
650.0000 mg | ORAL_TABLET | ORAL | Status: DC | PRN
  Administered 2020-04-12: 22:00:00 650 mg via ORAL
  Filled 2020-04-04: qty 2

## 2020-04-04 MED ORDER — ACETAMINOPHEN 650 MG RE SUPP
650.0000 mg | RECTAL | Status: DC | PRN
  Administered 2020-04-06 – 2020-04-07 (×2): 650 mg via RECTAL
  Filled 2020-04-04 (×3): qty 1

## 2020-04-04 MED ORDER — DIVALPROEX SODIUM 125 MG PO CSDR
250.0000 mg | DELAYED_RELEASE_CAPSULE | Freq: Three times a day (TID) | ORAL | Status: DC
Start: 2020-04-04 — End: 2020-04-05
  Administered 2020-04-04 (×2): 250 mg via ORAL
  Filled 2020-04-04 (×7): qty 2

## 2020-04-04 MED ORDER — ACETAMINOPHEN 325 MG PO TABS: 650.0000 mg | ORAL_TABLET | Freq: Four times a day (QID) | ORAL | Status: DC | PRN

## 2020-04-04 MED ORDER — ACETAMINOPHEN 160 MG/5ML PO SOLN
650.0000 mg | ORAL | Status: DC | PRN
  Administered 2020-04-08 – 2020-04-24 (×10): 650 mg
  Filled 2020-04-04 (×13): qty 20.3

## 2020-04-04 MED ORDER — SODIUM CHLORIDE 0.9 % IV SOLN
INTRAVENOUS | Status: DC | PRN
  Administered 2020-04-04: 250 mL via INTRAVENOUS

## 2020-04-04 MED ORDER — ORAL CARE MOUTH RINSE
15.0000 mL | Freq: Two times a day (BID) | OROMUCOSAL | Status: DC
  Administered 2020-04-04 – 2020-04-25 (×41): 15 mL via OROMUCOSAL

## 2020-04-04 MED ORDER — ASPIRIN EC 81 MG PO TBEC: 81.0000 mg | DELAYED_RELEASE_TABLET | Freq: Every day | ORAL | Status: DC

## 2020-04-04 NOTE — Progress Notes (Signed)
[facility] ADULT ICU REPLACEMENT PROTOCOL   The patient does apply for the [facility] Adult ICU Electrolyte Replacment Protocol based on the criteria listed below:   1. Is GFR >/= 30 ml/min? Yes.    Patient's GFR today is >60 2. Is SCr </= 2? Yes.   Patient's SCr is 0.88 ml/kg/hr 3. Did SCr increase >/= 0.5 in 24 hours? No. 4. Abnormal electrolyte(s): k 3.3 5. Ordered repletion with: protocol 6. If a panic level lab has been reported, has the CCM MD in charge been notified? No..   Physician:    Markus Daft A 04/04/2020 5:17 AM

## 2020-04-04 NOTE — Progress Notes (Signed)
Notified of sodium of 161. Lab to redraw to double check. 3% hypertonic saline stopped. Dr. Amada Jupiter updated.

## 2020-04-04 NOTE — Progress Notes (Signed)
Modified Barium Swallow Progress Note  Patient Details  Name: Gregory Osborn MRN: [id number] Date of Birth: Jul 21, 1945  Today's Date: 04/04/2020  Modified Barium Swallow completed.  Full report located under Chart Review in the Imaging Section.  Brief recommendations include the following:  Clinical Impression  Pt demonstrates a moderate oral dysphagia with severe left lingual and buccal weakness and sensory impairment leading to severe anterior spillage on the left and severe pocketing in the left buccal cavity with purees. His lingual body is functional enough to form and transit th majority of the bolus with only one instance of premature spillage with trace sensed aspiration. Pt was best able to accept bolus, self feeding with a straw. He will need assist with meal and frequent suction to left buccal cavity to clear residue. Will fu for tolerance and training to staff and family as needed.   Swallow Evaluation Recommendations       SLP Diet Recommendations: Dysphagia 1 (Puree) solids;Thin liquid   Liquid Administration via: Straw   Medication Administration: Crushed with puree   Supervision: Staff to assist with self feeding;Full supervision/cueing for compensatory strategies   Compensations: Lingual sweep for clearance of pocketing;Minimize environmental distractions;Follow solids with liquid       Oral Care Recommendations: Oral care before and after PO   Other Recommendations: Have oral suction available   Harlon Ditty, MA CCC-SLP  Acute Rehabilitation Services Pager [phone] Office [phone]   Claudine Mouton 04/04/2020,11:31 AM

## 2020-04-04 NOTE — CV Procedure (Signed)
2D echo attempted, but patient going to procedure soon. Try echo later per RN.

## 2020-04-04 NOTE — Progress Notes (Signed)
STROKE TEAM PROGRESS NOTE   INTERVAL HISTORY No acute events overnight.  Patient remained drowsy but can easily arouse and follows commands.  Continues to have right gaze deviation and dense left hemiplegia with mild left hemineglect.  Follow-up CT scan of the head shows progressive cytotoxic edema with no 6 mm right to left shift.  Serum sodium is yet below goal at 145.  Remains on hypertonic saline at 75 cc an hour through peripheral IV. Clinically there is some neurologic improvement today with left neglect. Able to recognize LUE. More attentive and conversant but still does not initiate interaction or engage in spontaneous discussion.  He complains of expected headache.  Son at bedside. Again, discussed need for careful monitoring and further plan of care. Questions answered.   Vitals:   04/04/20 1000 04/04/20 1100 04/04/20 1200 04/04/20 1300  BP: (!) 196/99 (!) 176/91 (!) 165/97 (!) 169/105  Pulse: 72 77 71 (!) 59  Resp: 13 16 16 16   Temp:      TempSrc:      SpO2: 97% 98%    Weight:      Height:       CBC:  Recent Labs  Lab 04/02/20 2208  WBC 14.8*  NEUTROABS 14.0*  HGB 14.9  HCT 42.4  MCV 84.8  PLT 244   Basic Metabolic Panel:  Recent Labs  Lab 04/03/20 1005 04/03/20 1652 04/04/20 0233 04/04/20 0957  NA 141   < > 145 148*  K 3.6  --  3.3*  --   CL 108  --  113*  --   CO2 23  --  24  --   GLUCOSE 125*  --  123*  --   BUN 8  --  9  --   CREATININE 0.93  --  0.88  --   CALCIUM 8.8*  --  8.6*  --    < > = values in this interval not displayed.   Lipid Panel:  Recent Labs  Lab 04/03/20 1005  CHOL 197  TRIG 109  HDL 57  CHOLHDL 3.5  VLDL 22  LDLCALC 04/05/20*   HgbA1c:  Recent Labs  Lab 04/03/20 1005  HGBA1C 5.6   Urine Drug Screen:  Recent Labs  Lab 04/02/20 1809  LABOPIA NONE DETECTED  COCAINSCRNUR NONE DETECTED  LABBENZ NONE DETECTED  AMPHETMU NONE DETECTED  THCU NONE DETECTED  LABBARB NONE DETECTED    Alcohol Level  Recent Labs  Lab  04/02/20 2208  ETH <10    IMAGING past 24 hours CT HEAD WO CONTRAST  Result Date: 04/04/2020 CLINICAL DATA:  Stroke follow-up EXAM: CT HEAD WITHOUT CONTRAST TECHNIQUE: Contiguous axial images were obtained from the base of the skull through the vertex without intravenous contrast. COMPARISON:  Yesterday FINDINGS: Brain: Progressively distinct cytotoxic edema in the right MCA territory. Primarily subarachnoid hemorrhage along the mid right sylvian fissure is non progressed. With progressive swelling there is new midline shift which measures 6 mm no entrapment. No evidence of new infarct. Vascular: High-density right MCA. Skull: Normal. Negative for fracture or focal lesion. Sinuses/Orbits: No acute finding. IMPRESSION: Progressive cytotoxic edema from right MCA infarction with new 6 mm of midline shift. Non progressed hemorrhage. Electronically Signed   By: 04/06/2020 M.D.   On: 04/04/2020 07:04   MR ANGIO HEAD WO CONTRAST  Result Date: 04/03/2020 CLINICAL DATA:  Neuro deficit, acute, stroke suspected. EXAM: MRI HEAD WITHOUT CONTRAST MRA HEAD WITHOUT CONTRAST MRA NECK WITHOUT CONTRAST TECHNIQUE: Multiplanar, multiecho pulse sequences  of the brain and surrounding structures were obtained without intravenous contrast. Angiographic images of the Circle of Willis were obtained using MRA technique without intravenous contrast. Angiographic images of the neck were obtained using MRA technique without intravenous contrast. Carotid stenosis measurements (when applicable) are obtained utilizing NASCET criteria, using the distal internal carotid diameter as the denominator. COMPARISON:  Noncontrast head CT 04/02/2020. Noncontrast head CT and CT angiogram head/neck performed 04/02/2020 FINDINGS: MRI HEAD FINDINGS Brain: A routine coronal T2 weighted sequence was not obtained. Cerebral volume is normal for age. Further progressed from the prior head CT of 04/02/2020, there is an acute/early subacute infarct  involving the majority of the right MCA vascular territory. This infarct also extends to involve the right MCA/ACA and right MCA/PCA watershed territories. As compared to the prior head CT, there is more complete involvement of the right basal ganglia. Mass effect has slightly increased, with increased partial effacement of the right lateral ventricle and now 3-4 mm leftward midline shift. There are additional subcentimeter acute infarcts within the right PCA territory within the medial right temporal lobe, and also within the right cerebellar hemisphere. Possible additional punctate acute infarct within the paramedian right frontal lobe (ACA territory). Redemonstrated small-volume subarachnoid hemorrhage within the right MCA cistern, right sylvian fissure and along the right temporoparietal lobes. Background mild multifocal T2/FLAIR hyperintensity within the cerebral white matter which is nonspecific, but compatible with chronic small vessel ischemic disease. Small chronic infarcts within the bilateral cerebellar hemispheres. Vascular: Reported below. Skull and upper cervical spine: No focal marrow lesion Sinuses/Orbits: Visualized orbits show no acute finding. Trace ethmoid sinus mucosal thickening. Small left maxillary sinus mucous retention cyst. MRA HEAD FINDINGS The intracranial internal carotid arteries are patent. The M1 segment of the right middle cerebral artery is now occluded proximally (previously occluded distally). The M1 left middle cerebral artery is patent. No left M2 proximal branch occlusion or high-grade proximal stenosis. The anterior cerebral arteries are patent. The dominant intracranial right vertebral artery is patent without stenosis. The non dominant and developmentally diminutive left vertebral artery is patent. The basilar artery is patent. The posterior cerebral arteries are patent. Sizable right posterior communicating artery. The left posterior communicating artery is hypoplastic or  absent. No intracranial aneurysm is identified. MRA NECK FINDINGS The common carotid and visualized internal carotid arteries are patent within the neck without hemodynamically significant stenosis. Retropharyngeal course of the proximal internal carotid arteries bilaterally. The visualized vertebral arteries are patent within the neck without hemodynamically significant stenosis. The right vertebral artery is strongly dominant, and the left is developmentally diminutive. MRI brain impressions #1, #2, #3 and #4 and MRA head impression #1 will be called to the ordering clinician or representative by the Radiologist Assistant, and communication documented in the PACS or [employer]. IMPRESSION: MRI brain: 1. 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 This infarct also involves the right MCA/ACA and MCA/PCA watershed territories. As compared to the prior CT, there is now more complete involvement of the right basal ganglia. 2. Slightly increased mass effect with increased partial effacement of the right lateral ventricle and now 3-4 mm leftward midline shift. 3. Unchanged small-volume subarachnoid hemorrhage within the right MCA cistern, sylvian fissure and along the right temporoparietal lobes. 4. Additional subcentimeter acute infarcts within the medial right temporal lobe (PCA vascular territory) and right cerebellar hemisphere. 5. Stable background mild chronic small vessel ischemic  disease. 6. 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 MRA head: 1. The M1 segment of the right middle cerebral artery is now occluded at its proximal aspect (previously occluded at its distal aspect). There is no appreciable flow-related signal within right MCA vessels more distally. 2. Elsewhere, no intracranial large vessel occlusion or proximal high-grade arterial stenosis is  identified. MRA neck: The common carotid, internal carotid and vertebral arteries are patent within the neck without hemodynamically significant stenosis. Electronically Signed   By: Jackey Loge DO   On: 04/03/2020 17:53   MR ANGIO NECK WO CONTRAST  Result Date: 04/03/2020 CLINICAL DATA:  Neuro deficit, acute, stroke suspected. EXAM: MRI HEAD WITHOUT CONTRAST MRA HEAD WITHOUT CONTRAST MRA NECK WITHOUT CONTRAST TECHNIQUE: Multiplanar, multiecho pulse sequences of the brain and surrounding structures were obtained without intravenous contrast. Angiographic images of the Circle of Willis were obtained using MRA technique without intravenous contrast. Angiographic images of the neck were obtained using MRA technique without intravenous contrast. Carotid stenosis measurements (when applicable) are obtained utilizing NASCET criteria, using the distal internal carotid diameter as the denominator. COMPARISON:  Noncontrast head CT 04/02/2020. Noncontrast head CT and CT angiogram head/neck performed 04/02/2020 FINDINGS: MRI HEAD FINDINGS Brain: A routine coronal T2 weighted sequence was not obtained. Cerebral volume is normal for age. Further progressed from the prior head CT of 04/02/2020, there is an acute/early subacute infarct involving the majority of the right MCA vascular territory. This infarct also extends to involve the right MCA/ACA and right MCA/PCA watershed territories. As compared to the prior head CT, there is more complete involvement of the right basal ganglia. Mass effect has slightly increased, with increased partial effacement of the right lateral ventricle and now 3-4 mm leftward midline shift. There are additional subcentimeter acute infarcts within the right PCA territory within the medial right temporal lobe, and also within the right cerebellar hemisphere. Possible additional punctate acute infarct within the paramedian right frontal lobe (ACA territory). Redemonstrated small-volume subarachnoid  hemorrhage within the right MCA cistern, right sylvian fissure and along the right temporoparietal lobes. Background mild multifocal T2/FLAIR hyperintensity within the cerebral white matter which is nonspecific, but compatible with chronic small vessel ischemic disease. Small chronic infarcts within the bilateral cerebellar hemispheres. Vascular: Reported below. Skull and upper cervical spine: No focal marrow lesion Sinuses/Orbits: Visualized orbits show no acute finding. Trace ethmoid sinus mucosal thickening. Small left maxillary sinus mucous retention cyst. MRA HEAD FINDINGS The intracranial internal carotid arteries are patent. The M1 segment of the right middle cerebral artery is now occluded proximally (previously occluded distally). The M1 left middle cerebral artery is patent. No left M2 proximal branch occlusion or high-grade proximal stenosis. The anterior cerebral arteries are patent. The dominant intracranial right vertebral artery is patent without stenosis. The non dominant and developmentally diminutive left vertebral artery is patent. The basilar artery is patent. The posterior cerebral arteries are patent. Sizable right posterior communicating artery. The left posterior communicating artery is hypoplastic or absent. No intracranial aneurysm is identified. MRA NECK FINDINGS The common carotid and visualized internal carotid arteries are patent within the neck without hemodynamically significant stenosis. Retropharyngeal course of the proximal internal carotid arteries bilaterally. The visualized vertebral arteries are patent within the neck without hemodynamically significant stenosis. The right vertebral artery is strongly dominant, and the left is developmentally diminutive. MRI brain impressions #1, #2, #3 and #4 and MRA head impression #1 will be called to the ordering clinician or representative by the Radiologist Assistant, and  communication documented in the PACS or [employer].  IMPRESSION: MRI brain: 1. 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 This infarct also involves the right MCA/ACA and MCA/PCA watershed territories. As compared to the prior CT, there is now more complete involvement of the right basal ganglia. 2. Slightly increased mass effect with increased partial effacement of the right lateral ventricle and now 3-4 mm leftward midline shift. 3. Unchanged small-volume subarachnoid hemorrhage within the right MCA cistern, sylvian fissure and along the right temporoparietal lobes. 4. Additional subcentimeter acute infarcts within the medial right temporal lobe (PCA vascular territory) and right cerebellar hemisphere. 5. Stable background mild chronic small vessel ischemic disease. 6. 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 MRA head: 1. The M1 segment of the right middle cerebral artery is now occluded at its proximal aspect (previously occluded at its distal aspect). There is no appreciable flow-related signal within right MCA vessels more distally. 2. Elsewhere, no intracranial large vessel occlusion or proximal high-grade arterial stenosis is identified. MRA neck: The common carotid, internal carotid and vertebral arteries are patent within the neck without hemodynamically significant stenosis. Electronically Signed   By: Jackey Loge DO   On: 04/03/2020 17:53   MR BRAIN WO CONTRAST  Result Date: 04/03/2020 CLINICAL DATA:  Neuro deficit, acute, stroke suspected. EXAM: MRI HEAD WITHOUT CONTRAST MRA HEAD WITHOUT CONTRAST MRA NECK WITHOUT CONTRAST TECHNIQUE: Multiplanar, multiecho pulse sequences of the brain and surrounding structures were obtained without intravenous contrast. Angiographic images of the Circle of Willis were obtained using MRA technique without intravenous contrast. Angiographic images of the neck were  obtained using MRA technique without intravenous contrast. Carotid stenosis measurements (when applicable) are obtained utilizing NASCET criteria, using the distal internal carotid diameter as the denominator. COMPARISON:  Noncontrast head CT 04/02/2020. Noncontrast head CT and CT angiogram head/neck performed 04/02/2020 FINDINGS: MRI HEAD FINDINGS Brain: A routine coronal T2 weighted sequence was not obtained. Cerebral volume is normal for age. Further progressed from the prior head CT of 04/02/2020, there is an acute/early subacute infarct involving the majority of the right MCA vascular territory. This infarct also extends to involve the right MCA/ACA and right MCA/PCA watershed territories. As compared to the prior head CT, there is more complete involvement of the right basal ganglia. Mass effect has slightly increased, with increased partial effacement of the right lateral ventricle and now 3-4 mm leftward midline shift. There are additional subcentimeter acute infarcts within the right PCA territory within the medial right temporal lobe, and also within the right cerebellar hemisphere. Possible additional punctate acute infarct within the paramedian right frontal lobe (ACA territory). Redemonstrated small-volume subarachnoid hemorrhage within the right MCA cistern, right sylvian fissure and along the right temporoparietal lobes. Background mild multifocal T2/FLAIR hyperintensity within the cerebral white matter which is nonspecific, but compatible with chronic small vessel ischemic disease. Small chronic infarcts within the bilateral cerebellar hemispheres. Vascular: Reported below. Skull and upper cervical spine: No focal marrow lesion Sinuses/Orbits: Visualized orbits show no acute finding. Trace ethmoid sinus mucosal thickening. Small left maxillary sinus mucous retention cyst. MRA HEAD FINDINGS The intracranial internal carotid arteries are patent. The M1 segment of the right middle cerebral artery is now  occluded proximally (previously occluded distally). The M1 left middle cerebral artery is patent. No left M2 proximal branch occlusion or high-grade proximal stenosis. The anterior cerebral arteries are patent. The dominant  intracranial right vertebral artery is patent without stenosis. The non dominant and developmentally diminutive left vertebral artery is patent. The basilar artery is patent. The posterior cerebral arteries are patent. Sizable right posterior communicating artery. The left posterior communicating artery is hypoplastic or absent. No intracranial aneurysm is identified. MRA NECK FINDINGS The common carotid and visualized internal carotid arteries are patent within the neck without hemodynamically significant stenosis. Retropharyngeal course of the proximal internal carotid arteries bilaterally. The visualized vertebral arteries are patent within the neck without hemodynamically significant stenosis. The right vertebral artery is strongly dominant, and the left is developmentally diminutive. MRI brain impressions #1, #2, #3 and #4 and MRA head impression #1 will be called to the ordering clinician or representative by the Radiologist Assistant, and communication documented in the PACS or [employer]. IMPRESSION: MRI brain: 1. 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 This infarct also involves the right MCA/ACA and MCA/PCA watershed territories. As compared to the prior CT, there is now more complete involvement of the right basal ganglia. 2. Slightly increased mass effect with increased partial effacement of the right lateral ventricle and now 3-4 mm leftward midline shift. 3. Unchanged small-volume subarachnoid hemorrhage within the right MCA cistern, sylvian fissure and along the right temporoparietal lobes. 4. Additional subcentimeter acute infarcts within the  medial right temporal lobe (PCA vascular territory) and right cerebellar hemisphere. 5. Stable background mild chronic small vessel ischemic disease. 6. 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 MRA head: 1. The M1 segment of the right middle cerebral artery is now occluded at its proximal aspect (previously occluded at its distal aspect). There is no appreciable flow-related signal within right MCA vessels more distally. 2. Elsewhere, no intracranial large vessel occlusion or proximal high-grade arterial stenosis is identified. MRA neck: The common carotid, internal carotid and vertebral arteries are patent within the neck without hemodynamically significant stenosis. Electronically Signed   By: Jackey Loge DO   On: 04/03/2020 17:53   PHYSICAL EXAM  Temp:  [98.4 F (36.9 C)-99.6 F (37.6 C)] 98.4 F (36.9 C) (02/18 0800) Pulse Rate:  [58-88] 59 (02/18 1300) Resp:  [11-29] 16 (02/18 1300) BP: (137-196)/(62-113) 169/105 (02/18 1300) SpO2:  [96 %-98 %] 98 % (02/18 1100)  General - Well nourished, well developed elderly Hispanic male, in no apparent distress.   Ophthalmologic - fundi not visualized due to noncooperation.  Cardiovascular - Regular rhythm and rate   Mental Status -  Drowsy but can be easily aroused with left sided neglect and intattention. Oriented x 3.  Speech appears clear Cranial Nerves II - XII - II -  No blink to threat on left but blinks on the right III, IV, VI - Gaze does not pass midline to the left with fixed right gaze deviation V - Facial sensation intact bilaterally  VII -mild left lower facial weakness.   VIII - Hearing intact to voice  X - Palate elevates symmetrically . XI - Chin turning & shoulder shrug intact bilaterally  XII - Tongue protrusion intact   Motor Strength -  Dense left hemiplegia with hypotonia particular in the left upper extremity.  Normal strength on the right. Withdraws to noxious stimuli LLE  Reflexes  -depressed on the left and normal on the right  Sensory - Light touch, temperature/pinprick were assessed and were symmetrical     Coordination - left plegia  Gait and Station - deferred.  ASSESSMENT/PLAN Bobbie Valletta is a 75 y.o. male with a history of recent urologic issues including UTI (01/22), urinary retention with foley cath placed, hematuria (likely traumatic due to self cath) s/p cystoscopy 04/01/20 by Dr. Pete Glatter [facility]  showing BPH with recommended surgical treatment. Surgical history includes appendectomy.He had been doing well up until 11:25am when he was noted to develop left hemiplegia. EMS was contacted, and took him to the [facility] ED where stroke workup was undertaken. He was noted to have left hemiplegia and neglect. NIHSS score 15.  He was not given tPA due to the hematuria. Transferred urgently to [facility] for thrombectomy which was aborted after multiple attempts which were unsuccessful.   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  MRA of the neck shows no significant extracranial stenosis  MRI brain 2/17: Since the prior head CT of 04/02/2020, there has been further progression of an acute/early subacute infarct involving the majority of the right MCA vascular territory. This infarct also involves the right MCA/ACA and MCA/PCA watershed territories. As compared to the prior CT, there is now more complete involvement of the right basal ganglia.Slightly increased mass effect with increased partial effacement of the right lateral ventricle and now 3-4 mm leftward midline shift.Unchanged small-volume subarachnoid hemorrhage within the right MCA cistern, sylvian fissure and along the right temporoparietal obes. Additional subcentimeter  acute infarcts within the medial right temporal lobe (PCA vascular territory) and right cerebellar hemisphere.  2D Echo completed, final result pending  LDL 118  HgbA1c 5.6  VTE prophylaxis     Diet   DIET - DYS 1 Room service appropriate? Yes; Fluid consistency: Thin   Swallow eval recommendation: Dysphagia diet with thin liquids   On ASA  by suppository daily pending swallow eval  Therapy recommendations:  CIR  Disposition:  TBD   Cerebral Edema  Critical care medicine team consulted.  Hypertonic saline at 75cc/hr per protocol with             serum sodium goal 150-155, currently 148  Family wants full aggressive measures including       hemicraniectomy if necessary  Neurosurgery, Dr. Jake Samples,  consulted and advised patient is not a candidate for surgical intervention at this time.   May need PICC to maximize medical therapy but will hold off for now with improved clinical exam  Hypertension  Home meds:  Lisinopril  daily   BP 132-188/60-90 . Permissive hypertension (OK if < 220/120) but       gradually normalize in 5-7 days . Long-term BP goal normotensive  HLD:   LDL 118, no statin prior to admission  L  Other Stroke Risk Factors   ETOH use, alcohol level <10, family reports he drinks regularly but not a problem.   Patient advised to stop using due to stroke risk.  Other Active Problems  Urinary retention d/t BPH present on admission  Developed urinary retention and UTI about a month ago necessitating trial of intermittent self cath (failed) and then subsequent foley placement  S/p cystoscopy 2/15 with recommended surgical intervention for BPH by Dr. Pete Glatter [facility])  Hematuria appears transient after attempts at self cath per urology notes review. Hematuria not present on on UA yesterday.   Continue foley catheter, Monitor for  hematuria   Hospital day # 2   Patient remains drowsy with dense left hemiplegia and right hemineglect to rule  out right MCA infarct with CT scan showing progressive cytotoxic edema and right-to-left midline shift however neurological exam appears to be slightly improved.  Agree with neurosurgery that hemicraniectomy can be avoided at the present time.  Discussed with Dr. Lynnell Catalanavi Agarwala critical care medicine.  Continue conservative management for now.  Long discussion at bedside with the patient's son and answered questions.  Speech therapy to check swallow eval.  Use Tylenol for headache.  We will hold off on PICC line placement as patient seemed slightly improved.  Repeat CT scan of the head tomorrow morning This patient is critically ill and at significant risk of neurological worsening, death and care requires constant monitoring of vital signs, hemodynamics,respiratory and cardiac monitoring, extensive review of multiple databases, frequent neurological assessment, discussion with family, other specialists and medical decision making of high complexity.I have made any additions or clarifications directly to the above note.This critical care time does not reflect procedure time, or teaching time or supervisory time of PA/NP/Med Resident etc but could involve care discussion time.  I spent 30 minutes of neurocritical care time  in the care of  this patient.    Delia HeadyPramod Owais Pruett, MD  To contact Stroke Continuity provider, please refer to [email or web address]. After hours, contact General Neurology

## 2020-04-04 NOTE — Consult Note (Signed)
NAME:  Gregory Osborn, MRN:  [id number], DOB:  1945-02-21, LOS: 2 ADMISSION DATE:  04/02/2020, CONSULTATION DATE:  04/03/2020 REFERRING MD:  Pearlean Brownie, CHIEF COMPLAINT:  R M1 occlusion   Brief History:  75 year old male who presented to [facility] with L hemiplegia, L facial droop, slurred speech after fall. 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 TPA not given due to hematuria history. Emergently transferred to [facility] IR for thrombectomy (unsuccessful).  History of Present Illness:  [name] is a 75 year old male with history of HTN (recently started on lisinopril), BPH, urinary retention with frequent UTIs (recently utilizing an indwelling Foley catheter at home, followed by Urology) and hematuria (likely 2/2 traumatic catheterization). Per family he was doing well overall 2/16 until 1125 when he was reportedly had a fall at home. Family found him with new onset of L-sided paralysis, L facial droop and slurred speech and called EMS. He was taken to [facility] for evaluation and diagnosed with R M1 occlusion. No TPA was administered, given patient's recent report of hematuria. He was emergently transferred to [facility] for IR thrombectomy/recanalization.   Per IR procedure note, temporary recanalization was achieved multiple times but resulted in subsequent reocclusion (8 pass attempts made). Post-procedure, patient was transferred to 4N. PCCM was consulted 2/17 in the setting of expected post-stroke edema for possible need for reintubation and ventilatory support.  Today, patient reports ongoing LUE/LLE weakness and chest discomfort (reproducible). Denies recent fever/chills, SOB/dyspnea, n/v/d, sick contacts. He has had some urinary symptoms (hesitancy, hematuria) as noted above for which he is well-established with urology.  Past Medical History:  HTN, urinary retention with frequent UTIs, BPH, hematuria  Significant Hospital Events:  2/16 Presented to [facility] with L hemiplegia/facial droop, LKW 2/16 1125. 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  Patient is awake and communicative complains of headache and left-sided chest pain on palpation.  Complains of thirst.  Says he is hungry.  Objective   Blood pressure (!) 156/75, pulse 72, temperature (!) 97.5 F (36.4 C), temperature source Oral, resp. rate 19, height 5\' 5"  (1.651 m), weight 76.3 kg, SpO2 98 %.        Intake/Output Summary (Last 24 hours) at 04/04/2020 1543 Last data filed at 04/04/2020 1500 Gross per 24 hour  Intake 2681.67 ml  Output 2680 ml  Net 1.67 ml   Filed Weights   04/02/20 1519 04/02/20 1730  Weight: 77.1 kg 76.3 kg   Examination: General: WDWN adult male, laying in bed, in NAD. HEENT: Anicteric sclera, PERRL. Moist mucous membranes. L-sided facial droop with rightward gaze preference. Neuro: Fully awake, but oriented. Answering questions appropriately without dysarthria.  No left-sided L-sided neglect of LUE/LLE CV: S1S2, RRR, no m/g/r. PULM: Breathing even and unlabored on RA. Lung fields CTAB anteriorly. GI: Soft, nontender, nondistended. Extremities: No LE edema noted. Profound weakness of LUE/LLE. Skin: Warm/dry, no rashes.  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  Given stable to improving exam at peak swelling, should be able to begin weaning 3% tomorrow -Acetaminophen for headache -Progressive stroke  Best practice (evaluated daily)  Diet: NPO, swallow study pending Pain/Anxiety/Delirium protocol (if indicated): N/A VAP protocol (if indicated): N/A DVT prophylaxis: Lovenox GI prophylaxis: N/A Glucose control: SSI PRN Mobility: Bedrest Disposition: ICU  Goals of Care:  Last date of multidisciplinary goals of care discussion: Per primary Family and staff present:  Summary of discussion:  Follow up goals of care discussion due: 2/23 Code Status: Full  Labs   CBC: Recent Labs  Lab 04/02/20 2208  WBC 14.8*  NEUTROABS 14.0*  HGB 14.9  HCT 42.4  MCV 84.8  PLT 244    Basic Metabolic Panel: Recent Labs  Lab 04/02/20 2208 04/03/20 1005 04/03/20 1652 04/03/20 1827 04/04/20 0233 04/04/20 0957  NA 139 141 143 142 145 148*  K 3.7 3.6  --   --  3.3*  --   CL 106 108  --   --  113*  --   CO2 21* 23  --   --  24  --   GLUCOSE 182* 125*  --   --  123*  --   BUN 7* 8  --   --  9  --   CREATININE 0.90 0.93  --   --  0.88  --   CALCIUM 8.5* 8.8*  --   --  8.6*  --    GFR: Estimated Creatinine Clearance: 72.3 mL/min (by C-G formula based on SCr of 0.88 mg/dL). Recent Labs  Lab 04/02/20 2208  WBC 14.8*    Liver Function Tests: Recent Labs  Lab 04/02/20 2208  AST 29  ALT 18  ALKPHOS 86  BILITOT 0.8  PROT 6.5  ALBUMIN 3.4*   No results for input(s): LIPASE, AMYLASE in the last 168 hours. No results for input(s): AMMONIA in the last 168 hours.  ABG No results found for: PHART, PCO2ART, PO2ART, HCO3, TCO2, ACIDBASEDEF, O2SAT   Coagulation Profile: Recent Labs  Lab 04/02/20 2208  INR 1.0    Cardiac Enzymes: No results for  input(s): CKTOTAL, CKMB, CKMBINDEX, TROPONINI in the last 168 hours.  HbA1C: Hgb A1c MFr Bld  Date/Time Value Ref Range Status  04/03/2020 10:05 AM 5.6 4.8 - 5.6 % Final    Comment:    (NOTE) Pre diabetes:          5.7%-6.4%  Diabetes:              >6.4%  Glycemic control for   <7.0% adults with diabetes     CBG: Recent Labs  Lab 04/02/20 2006  GLUCAP 176*    Lynnell Catalan, MD [facility] 04/04/20 3:43 PM  Please see [email or web address] for pager details.

## 2020-04-04 NOTE — Progress Notes (Signed)
  Echocardiogram 2D Echocardiogram has been performed.  Gregory Osborn 04/04/2020, 2:13 PM

## 2020-04-04 NOTE — Progress Notes (Signed)
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 Karenann Cai. Patient awake and alert laying in bed. Complains of right-sided headache, rated 10/10 at this time. Has nausea associated with headache, denies vomiting or vision changes. Son at bedside. Can spontaneously move right side with no spontaneous movements of left side. Left facial droop. Right femoral puncture site c/d/i.  CT head this AM: 1. Progressive cytotoxic edema from right MCA infarction with new 6 mm of midline shift. Non progressed hemorrhage.   Allergies: Patient has no known allergies.  Medications: Prior to Admission medications   Medication Sig Start Date End Date Taking? Authorizing Provider  acetaminophen (TYLENOL) 500 MG tablet Take 1,000 mg by mouth every 6 (six) hours as needed for mild pain.   Yes [provider]  alfuzosin (UROXATRAL) 10 MG 24 hr tablet Take 10 mg by mouth daily. 03/13/20  Yes [provider]  ibuprofen (ADVIL) 200 MG tablet Take 400 mg by mouth every 6 (six) hours as needed for mild pain.   Yes [provider]  lisinopril (ZESTRIL) 5 MG tablet Take 5 mg by mouth daily. 02/27/20  Yes [provider]  Multiple Vitamins-Minerals (MULTI FOR HIM 50+) TABS Take 1 tablet by mouth daily.   Yes [provider]     Vital Signs: BP (!) 167/95   Pulse 88   Temp 98.4 F (36.9 C) (Oral)   Resp (!) 29   Ht '5\' 5"'  (1.651 m)   Wt 168 lb 3.4 oz (76.3 kg)   SpO2 97%   BMI 27.99 kg/m   Physical Exam Vitals reviewed.  Constitutional:      General: He is not in acute distress. Pulmonary:      Effort: Pulmonary effort is normal. No respiratory distress.  Skin:    General: Skin is warm and dry.     Comments: Right femoral puncture site soft without active bleeding or hematoma.  Neurological:     Mental Status: He is alert.     Comments: Alert and awake. Follows simple commands. PERRL bilaterally. Left facial droop. Tongue midline. Can spontaneously move right side with no spontaneous movements of left side. Distal pulses (DPs) 2+ bilaterally.     Imaging: CT HEAD WO CONTRAST  Result Date: 04/04/2020 CLINICAL DATA:  Stroke follow-up EXAM: CT HEAD WITHOUT CONTRAST TECHNIQUE: Contiguous axial images were obtained from the base of the skull through the vertex without intravenous contrast. COMPARISON:  Yesterday FINDINGS: Brain: Progressively distinct cytotoxic edema in the right MCA territory. Primarily subarachnoid hemorrhage along the mid right sylvian fissure is non progressed. With progressive swelling there is new midline shift which measures 6 mm no entrapment. No evidence of new infarct. Vascular: High-density right MCA. Skull: Normal. Negative for fracture or focal lesion. Sinuses/Orbits: No acute finding. IMPRESSION: Progressive cytotoxic edema from right MCA infarction with new 6 mm of midline shift. Non progressed hemorrhage. Electronically Signed   By: Monte Fantasia M.D.   On: 04/04/2020 07:04   CT HEAD WO CONTRAST  Result Date: 04/02/2020 CLINICAL DATA:  Follow-up stroke with thrombectomy attempt. EXAM: CT HEAD WITHOUT CONTRAST TECHNIQUE: Contiguous axial images were obtained from the base of the skull through the  vertex without intravenous contrast. COMPARISON:  CT studies earlier same day. FINDINGS: Brain: No acute finding affecting the brainstem or left cerebral hemisphere. On the right, there is low-density now seen throughout the right middle cerebral artery territory consistent with complete right MCA territory infarction. This includes at least partial  involvement of the basal ganglia. Areas of hyperdensity in the deep insula and frontoparietal junction region could represent a combination of contrast staining and a small amount of hemorrhage. No large confluent hematoma. Mild swelling with early mass-effect, right-to-left shift of 2 mm. No hydrocephalus. No extra-axial collection. Vascular: No new vascular finding. Skull: Normal Sinuses/Orbits: Clear/normal Other: None IMPRESSION: Complete right MCA territory infarction, with at least partial involvement of the basal ganglia. Areas of hyperdensity in the deep insula and frontoparietal junction region could represent a combination of contrast staining and a small amount of hemorrhage. No large confluent hematoma. Mild swelling with early mass-effect, right-to-left shift of 2 mm. Electronically Signed   By: Nelson Chimes M.D.   On: 04/02/2020 21:45   MR ANGIO HEAD WO CONTRAST  Result Date: 04/03/2020 CLINICAL DATA:  Neuro deficit, acute, stroke suspected. EXAM: MRI HEAD WITHOUT CONTRAST MRA HEAD WITHOUT CONTRAST MRA NECK WITHOUT CONTRAST TECHNIQUE: Multiplanar, multiecho pulse sequences of the brain and surrounding structures were obtained without intravenous contrast. Angiographic images of the Circle of Willis were obtained using MRA technique without intravenous contrast. Angiographic images of the neck were obtained using MRA technique without intravenous contrast. Carotid stenosis measurements (when applicable) are obtained utilizing NASCET criteria, using the distal internal carotid diameter as the denominator. COMPARISON:  Noncontrast head CT 04/02/2020. Noncontrast head CT and CT angiogram head/neck performed 04/02/2020 FINDINGS: MRI HEAD FINDINGS Brain: A routine coronal T2 weighted sequence was not obtained. Cerebral volume is normal for age. Further progressed from the prior head CT of 04/02/2020, there is an acute/early subacute infarct involving the majority of the right MCA vascular territory. This  infarct also extends to involve the right MCA/ACA and right MCA/PCA watershed territories. As compared to the prior head CT, there is more complete involvement of the right basal ganglia. Mass effect has slightly increased, with increased partial effacement of the right lateral ventricle and now 3-4 mm leftward midline shift. There are additional subcentimeter acute infarcts within the right PCA territory within the medial right temporal lobe, and also within the right cerebellar hemisphere. Possible additional punctate acute infarct within the paramedian right frontal lobe (ACA territory). Redemonstrated small-volume subarachnoid hemorrhage within the right MCA cistern, right sylvian fissure and along the right temporoparietal lobes. Background mild multifocal T2/FLAIR hyperintensity within the cerebral white matter which is nonspecific, but compatible with chronic small vessel ischemic disease. Small chronic infarcts within the bilateral cerebellar hemispheres. Vascular: Reported below. Skull and upper cervical spine: No focal marrow lesion Sinuses/Orbits: Visualized orbits show no acute finding. Trace ethmoid sinus mucosal thickening. Small left maxillary sinus mucous retention cyst. MRA HEAD FINDINGS The intracranial internal carotid arteries are patent. The M1 segment of the right middle cerebral artery is now occluded proximally (previously occluded distally). The M1 left middle cerebral artery is patent. No left M2 proximal branch occlusion or high-grade proximal stenosis. The anterior cerebral arteries are patent. The dominant intracranial right vertebral artery is patent without stenosis. The non dominant and developmentally diminutive left vertebral artery is patent. The basilar artery is patent. The posterior cerebral arteries are patent. Sizable right posterior communicating artery. The left posterior communicating artery is hypoplastic or absent. No intracranial aneurysm is identified.  MRA NECK FINDINGS  The common carotid and visualized internal carotid arteries are patent within the neck without hemodynamically significant stenosis. Retropharyngeal course of the proximal internal carotid arteries bilaterally. The visualized vertebral arteries are patent within the neck without hemodynamically significant stenosis. The right vertebral artery is strongly dominant, and the left is developmentally diminutive. MRI brain impressions #1, #2, #3 and #4 and MRA head impression #1 will be called to the ordering clinician or representative by the Radiologist Assistant, and communication documented in the PACS or [employer]. IMPRESSION: MRI brain: 1. 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 This infarct also involves the right MCA/ACA and MCA/PCA watershed territories. As compared to the prior CT, there is now more complete involvement of the right basal ganglia. 2. Slightly increased mass effect with increased partial effacement of the right lateral ventricle and now 3-4 mm leftward midline shift. 3. Unchanged small-volume subarachnoid hemorrhage within the right MCA cistern, sylvian fissure and along the right temporoparietal lobes. 4. Additional subcentimeter acute infarcts within the medial right temporal lobe (PCA vascular territory) and right cerebellar hemisphere. 5. Stable background mild chronic small vessel ischemic disease. 6. 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 MRA head: 1. The M1 segment of the right middle cerebral artery is now occluded at its proximal aspect (previously occluded at its distal aspect). There is no appreciable flow-related signal within right MCA vessels more distally. 2. Elsewhere, no intracranial large vessel occlusion or proximal high-grade arterial stenosis is identified. MRA neck: The common carotid, internal carotid and  vertebral arteries are patent within the neck without hemodynamically significant stenosis. Electronically Signed   By: Kellie Simmering DO   On: 04/03/2020 17:53   MR ANGIO NECK WO CONTRAST  Result Date: 04/03/2020 CLINICAL DATA:  Neuro deficit, acute, stroke suspected. EXAM: MRI HEAD WITHOUT CONTRAST MRA HEAD WITHOUT CONTRAST MRA NECK WITHOUT CONTRAST TECHNIQUE: Multiplanar, multiecho pulse sequences of the brain and surrounding structures were obtained without intravenous contrast. Angiographic images of the Circle of Willis were obtained using MRA technique without intravenous contrast. Angiographic images of the neck were obtained using MRA technique without intravenous contrast. Carotid stenosis measurements (when applicable) are obtained utilizing NASCET criteria, using the distal internal carotid diameter as the denominator. COMPARISON:  Noncontrast head CT 04/02/2020. Noncontrast head CT and CT angiogram head/neck performed 04/02/2020 FINDINGS: MRI HEAD FINDINGS Brain: A routine coronal T2 weighted sequence was not obtained. Cerebral volume is normal for age. Further progressed from the prior head CT of 04/02/2020, there is an acute/early subacute infarct involving the majority of the right MCA vascular territory. This infarct also extends to involve the right MCA/ACA and right MCA/PCA watershed territories. As compared to the prior head CT, there is more complete involvement of the right basal ganglia. Mass effect has slightly increased, with increased partial effacement of the right lateral ventricle and now 3-4 mm leftward midline shift. There are additional subcentimeter acute infarcts within the right PCA territory within the medial right temporal lobe, and also within the right cerebellar hemisphere. Possible additional punctate acute infarct within the paramedian right frontal lobe (ACA territory). Redemonstrated small-volume subarachnoid hemorrhage within the right MCA cistern, right sylvian fissure  and along the right temporoparietal lobes. Background mild multifocal T2/FLAIR hyperintensity within the cerebral white matter which is nonspecific, but compatible with chronic small vessel ischemic disease. Small chronic infarcts within the bilateral cerebellar hemispheres.  Vascular: Reported below. Skull and upper cervical spine: No focal marrow lesion Sinuses/Orbits: Visualized orbits show no acute finding. Trace ethmoid sinus mucosal thickening. Small left maxillary sinus mucous retention cyst. MRA HEAD FINDINGS The intracranial internal carotid arteries are patent. The M1 segment of the right middle cerebral artery is now occluded proximally (previously occluded distally). The M1 left middle cerebral artery is patent. No left M2 proximal branch occlusion or high-grade proximal stenosis. The anterior cerebral arteries are patent. The dominant intracranial right vertebral artery is patent without stenosis. The non dominant and developmentally diminutive left vertebral artery is patent. The basilar artery is patent. The posterior cerebral arteries are patent. Sizable right posterior communicating artery. The left posterior communicating artery is hypoplastic or absent. No intracranial aneurysm is identified. MRA NECK FINDINGS The common carotid and visualized internal carotid arteries are patent within the neck without hemodynamically significant stenosis. Retropharyngeal course of the proximal internal carotid arteries bilaterally. The visualized vertebral arteries are patent within the neck without hemodynamically significant stenosis. The right vertebral artery is strongly dominant, and the left is developmentally diminutive. MRI brain impressions #1, #2, #3 and #4 and MRA head impression #1 will be called to the ordering clinician or representative by the Radiologist Assistant, and communication documented in the PACS or [employer]. IMPRESSION: MRI brain: 1. Since the prior head CT of 04/02/2020, there  has been further progression of an acute/early subacute infarct involving the majority of the right MCA vascular territory. This infarct also involves the right MCA/ACA and MCA/PCA watershed territories. As compared to the prior CT, there is now more complete involvement of the right basal ganglia. 2. Slightly increased mass effect with increased partial effacement of the right lateral ventricle and now 3-4 mm leftward midline shift. 3. Unchanged small-volume subarachnoid hemorrhage within the right MCA cistern, sylvian fissure and along the right temporoparietal lobes. 4. Additional subcentimeter acute infarcts within the medial right temporal lobe (PCA vascular territory) and right cerebellar hemisphere. 5. Stable background mild chronic small vessel ischemic disease. 6. 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 MRA head: 1. The M1 segment of the right middle cerebral artery is now occluded at its proximal aspect (previously occluded at its distal aspect). There is no appreciable flow-related signal within right MCA vessels more distally. 2. Elsewhere, no intracranial large vessel occlusion or proximal high-grade arterial stenosis is identified. MRA neck: The common carotid, internal carotid and vertebral arteries are patent within the neck without hemodynamically significant stenosis. Electronically Signed   By: Kellie Simmering DO   On: 04/03/2020 17:53   MR BRAIN WO CONTRAST  Result Date: 04/03/2020 CLINICAL DATA:  Neuro deficit, acute, stroke suspected. EXAM: MRI HEAD WITHOUT CONTRAST MRA HEAD WITHOUT CONTRAST MRA NECK WITHOUT CONTRAST TECHNIQUE: Multiplanar, multiecho pulse sequences of the brain and surrounding structures were obtained without intravenous contrast. Angiographic images of the Circle of Willis were obtained using MRA technique without intravenous contrast. Angiographic images of the neck were obtained using MRA technique without intravenous contrast. Carotid  stenosis measurements (when applicable) are obtained utilizing NASCET criteria, using the distal internal carotid diameter as the denominator. COMPARISON:  Noncontrast head CT 04/02/2020. Noncontrast head CT and CT angiogram head/neck performed 04/02/2020 FINDINGS: MRI HEAD FINDINGS Brain: A routine coronal T2 weighted sequence was not obtained. Cerebral volume is normal for age. Further progressed from the prior head CT of 04/02/2020, there is an acute/early subacute infarct involving the majority of the right MCA vascular territory. This infarct also extends to involve  the right MCA/ACA and right MCA/PCA watershed territories. As compared to the prior head CT, there is more complete involvement of the right basal ganglia. Mass effect has slightly increased, with increased partial effacement of the right lateral ventricle and now 3-4 mm leftward midline shift. There are additional subcentimeter acute infarcts within the right PCA territory within the medial right temporal lobe, and also within the right cerebellar hemisphere. Possible additional punctate acute infarct within the paramedian right frontal lobe (ACA territory). Redemonstrated small-volume subarachnoid hemorrhage within the right MCA cistern, right sylvian fissure and along the right temporoparietal lobes. Background mild multifocal T2/FLAIR hyperintensity within the cerebral white matter which is nonspecific, but compatible with chronic small vessel ischemic disease. Small chronic infarcts within the bilateral cerebellar hemispheres. Vascular: Reported below. Skull and upper cervical spine: No focal marrow lesion Sinuses/Orbits: Visualized orbits show no acute finding. Trace ethmoid sinus mucosal thickening. Small left maxillary sinus mucous retention cyst. MRA HEAD FINDINGS The intracranial internal carotid arteries are patent. The M1 segment of the right middle cerebral artery is now occluded proximally (previously occluded distally). The M1 left  middle cerebral artery is patent. No left M2 proximal branch occlusion or high-grade proximal stenosis. The anterior cerebral arteries are patent. The dominant intracranial right vertebral artery is patent without stenosis. The non dominant and developmentally diminutive left vertebral artery is patent. The basilar artery is patent. The posterior cerebral arteries are patent. Sizable right posterior communicating artery. The left posterior communicating artery is hypoplastic or absent. No intracranial aneurysm is identified. MRA NECK FINDINGS The common carotid and visualized internal carotid arteries are patent within the neck without hemodynamically significant stenosis. Retropharyngeal course of the proximal internal carotid arteries bilaterally. The visualized vertebral arteries are patent within the neck without hemodynamically significant stenosis. The right vertebral artery is strongly dominant, and the left is developmentally diminutive. MRI brain impressions #1, #2, #3 and #4 and MRA head impression #1 will be called to the ordering clinician or representative by the Radiologist Assistant, and communication documented in the PACS or [employer]. IMPRESSION: MRI brain: 1. 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 This infarct also involves the right MCA/ACA and MCA/PCA watershed territories. As compared to the prior CT, there is now more complete involvement of the right basal ganglia. 2. Slightly increased mass effect with increased partial effacement of the right lateral ventricle and now 3-4 mm leftward midline shift. 3. Unchanged small-volume subarachnoid hemorrhage within the right MCA cistern, sylvian fissure and along the right temporoparietal lobes. 4. Additional subcentimeter acute infarcts within the medial right temporal lobe (PCA vascular territory) and right  cerebellar hemisphere. 5. Stable background mild chronic small vessel ischemic disease. 6. 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 MRA head: 1. The M1 segment of the right middle cerebral artery is now occluded at its proximal aspect (previously occluded at its distal aspect). There is no appreciable flow-related signal within right MCA vessels more distally. 2. Elsewhere, no intracranial large vessel occlusion or proximal high-grade arterial stenosis is identified. MRA neck: The common carotid, internal carotid and vertebral arteries are patent within the neck without hemodynamically significant stenosis. Electronically Signed   By: Kellie Simmering DO   On: 04/03/2020 17:53   IR CT Head Ltd  Result Date: 04/03/2020 INDICATION: 75 year old male with past medical history is significant for UTI, urinary retention, recent hematuria and appendectomy presenting with  sudden onset left-sided hemiplegia. Left known well at 11:25 a.m. on 04/02/2020. No IV tPA given due to recent hematuria. Premorbid Rankin scale 0; NIHSS 15. Head CT showed early ischemic changes in the frontoparietal and insular cortex (aspects 6) and CT angiogram of the head and neck showed a distal right M1/MCA a with poor collaterals. He was transferred to our service for a mechanical thrombectomy. EXAM: Diagnostic cerebral angiogram Mechanical thrombectomy Flat panel head CT COMPARISON:  CT/CT angiogram of head and neck April 02, 2020. MEDICATIONS: 10 mg of verapamil intra arterial ANESTHESIA/SEDATION: The procedure was performed in the general anesthesia. CONTRAST:  34m of Omnipaque 300 FLUOROSCOPY TIME:  Fluoroscopy Time:  minutes  seconds ( mGy). COMPLICATIONS: SIR Level A - No therapy, no consequence. TECHNIQUE: Informed written consent was obtained from the patient's daughter after a thorough discussion of the procedural risks, benefits and alternatives. All questions were addressed. Maximal Sterile Barrier  Technique was utilized including caps, mask, sterile gowns, sterile gloves, sterile drape, hand hygiene and skin antiseptic. A timeout was performed prior to the initiation of the procedure. The right groin was prepped and draped in the usual sterile fashion. Using a micropuncture kit and the modified Seldinger technique, access was gained to the right common femoral artery and an 8 French sheath was placed. Under fluoroscopy, an 8 [place] balloon guide catheter was navigated over a 6 [place] 2 catheter and a 0.035" Terumo Glidewire into the aortic arch. The catheter was placed into the right common carotid artery and then advanced into the right internal carotid artery. The inner catheter was removed. Frontal and lateral angiograms of the head were obtained. FINDINGS: 1. Increased tortuosity of the cervical right ICA. 2. Occlusion of the distal right M1/MCA. PROCEDURE: Under biplane roadmap, a zoom 71 aspiration catheter was navigated over a phenom 21 microcatheter and a synchro support microguidewire into the cavernous segment of the right ICA. The microcatheter was then navigated over the wire into the right M1 segment. Then, the aspiration catheter was advanced to the level of occlusion and connected to a penumbra aspiration pump. The guiding catheter balloon was inflated. The aspiration catheter was removed under constant aspiration. 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 Under biplane roadmap, a zoom 71 aspiration catheter was navigated over a phenom 21 microcatheter and a synchro support microguidewire into the cavernous segment of the right ICA. The microcatheter was then navigated over the wire into the right M1 segment. Then, the aspiration catheter was advanced to the level of occlusion and connected to a penumbra aspiration pump. The guiding catheter balloon was inflated. The aspiration catheter was  removed under constant aspiration. 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 Under biplane roadmap, a zoom 71 aspiration catheter was navigated over a phenom 21 microcatheter and a synchro support microguidewire into the cavernous segment of the right ICA. The microcatheter was then navigated over the wire into the right M2/MCA middle division branch. Then, a 6 mm embotrap stent retriever was deployed spanning the distal M1 and M2 segment. The device was allowed to intercalated with the clot for 4 minutes. The microcatheter was removed. The aspiration catheter was advanced to the level of occlusion and connected to a penumbra aspiration pump. The guiding catheter balloon was inflated. The thrombectomy device and aspiration catheter were removed under constant aspiration. 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 Under biplane roadmap, a zoom 71 aspiration catheter was navigated over a phenom 21 microcatheter and a synchro support microguidewire into the cavernous segment of the right ICA. The microcatheter was then navigated over the wire into the right M2/MCA middle division branch. Then, a 5 x 37 mm embotrap stent retriever was deployed spanning the distal M1 and M2 segment. The device was allowed to intercalated with the clot for 4 minutes. The microcatheter was removed. The aspiration catheter was advanced to the level of occlusion and connected to a penumbra aspiration pump. The guiding catheter balloon was inflated. The thrombectomy device and aspiration catheter were removed under constant aspiration. 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 Delayed follow-up showed reocclusion. Under biplane roadmap, a zoom 71 aspiration catheter was navigated over a phenom 21 microcatheter and a synchro support microguidewire into the cavernous segment of the right ICA. The microcatheter was then navigated over the wire into the right M2/MCA middle division branch. Then, a 5 x 37 mm embotrap stent retriever was deployed spanning the distal M1 and M2 segment. The device was allowed to intercalated with the clot for 4 minutes. The microcatheter was removed. The aspiration catheter was advanced to the level of occlusion and connected to a penumbra aspiration pump. The guiding catheter balloon was inflated. The thrombectomy device and aspiration catheter were removed under constant aspiration. 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 Delayed follow-up showed reocclusion. Under biplane roadmap, a zoom 71 aspiration catheter was navigated over a phenom 21 microcatheter and a synchro support microguidewire into the cavernous segment of the right ICA. The microcatheter was then navigated over the wire into the right M2/MCA posterior division branch. Then, a 5 x 37 mm embotrap stent retriever was deployed spanning the distal M1 and M2 segment. The device was allowed to intercalated with the clot for 4 minutes. The microcatheter was removed. The aspiration catheter was advanced to the level of occlusion and connected to a penumbra aspiration pump. The guiding catheter balloon was inflated. The thrombectomy device and aspiration catheter were removed under constant aspiration. 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 Delayed follow-up showed reocclusion. Flat panel CT of the head was  obtained and post processed in a separate workstation with concurrent attending physician supervision. Selected images were sent to PACS. No evidence of complication. Under biplane roadmap, a zoom 71 aspiration catheter was navigated over a phenom 21 microcatheter and a synchro support microguidewire into the cavernous segment of the right ICA. The microcatheter was then navigated over the wire into the right M2/MCA posterior division branch. Then, a tiger 7 stent retriever was deployed spanning the distal M1 and M2 segment. The device was allowed to intercalated with the clot for 4 minutes. The microcatheter was removed. The aspiration catheter was advanced to the level of occlusion and connected to a penumbra aspiration pump. The guiding catheter balloon was inflated. The thrombectomy device and aspiration catheter were removed under constant aspiration. 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 Delayed follow-up showed reocclusion. Under biplane roadmap, a zoom 71 aspiration catheter was navigated over a phenom 21 microcatheter and a synchro support microguidewire into the cavernous segment of the right ICA. The microcatheter was then navigated over the wire into the right M2/MCA posterior division branch. Then, a 4 x 40 mm solitaire stent retriever was deployed spanning the distal M1 and M2 segment. The device was allowed to intercalated with the clot for 4 minutes. The microcatheter was removed. The aspiration catheter was advanced to the level of occlusion and connected to a penumbra aspiration pump. The guiding catheter balloon was inflated. The thrombectomy device and aspiration catheter were removed under constant aspiration. 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 Delayed follow-up showed reocclusion. Under biplane roadmap, a zoom 71 aspiration catheter was navigated over a phenom 21 microcatheter and a synchro support microguidewire into the cavernous segment of the right ICA. The microcatheter was then navigated over the wire into the right M2/MCA middle division branch. Then, a 4 x 40 mm solitaire stent retriever was deployed spanning the distal M1 and M2 segment. The device was allowed to intercalated with the clot for 4 minutes. The microcatheter was removed. The aspiration catheter was advanced to the level of occlusion and connected to a penumbra aspiration pump. The guiding catheter balloon was inflated. The thrombectomy device and aspiration catheter were removed under constant aspiration. 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 Delayed follow-up showed reocclusion. Flat panel CT of the head was obtained and post processed in a separate workstation with concurrent attending physician supervision. Selected images were sent to PACS. Small contrast extravasation seen into the right sylvian fissure. The catheter construct was subsequently withdrawn. Right common femoral artery angiograms were obtained frontal and lateral views. The femoral sheath was then exchanged over the wire for a Perclose ProGlide which was utilized for access closure. Immediate hemostasis was achieved. IMPRESSION: Unsuccessful mechanical thrombectomy for treatment of a distal right M1/MCA occlusion. Multiple attempts performed with temporary partial recanalization followed by subsequent reocclusion. A total of 8 passes were performed in different branches without recanalization (TICI0). PLAN: Patient was transferred to ICU for continued care. Electronically Signed   By: Pedro Earls M.D.   On:  04/03/2020 13:11   [facility] [place]  Result Date: 04/03/2020 INDICATION: 75 year old male with past medical history is significant for UTI, urinary retention, recent hematuria and appendectomy presenting with sudden onset left-sided hemiplegia. Left known well at 11:25 a.m. on 04/02/2020. No IV tPA given due to recent hematuria. Premorbid Rankin scale 0; NIHSS 15. Head CT showed early ischemic changes in the frontoparietal and insular cortex (aspects 6) and CT angiogram of the head and neck showed a distal right M1/MCA a with poor collaterals. He was transferred to our service for a mechanical thrombectomy. EXAM: Diagnostic cerebral angiogram Mechanical thrombectomy Flat panel head CT COMPARISON:  CT/CT angiogram of head and neck April 02, 2020. MEDICATIONS: 10 mg of verapamil intra arterial ANESTHESIA/SEDATION: The procedure was performed in the general anesthesia. CONTRAST:  25m of Omnipaque 300 FLUOROSCOPY TIME:  Fluoroscopy Time:  minutes  seconds ( mGy). COMPLICATIONS: SIR Level A - No therapy, no consequence.  TECHNIQUE: Informed written consent was obtained from the patient's daughter after a thorough discussion of the procedural risks, benefits and alternatives. All questions were addressed. Maximal Sterile Barrier Technique was utilized including caps, mask, sterile gowns, sterile gloves, sterile drape, hand hygiene and skin antiseptic. A timeout was performed prior to the initiation of the procedure. The right groin was prepped and draped in the usual sterile fashion. Using a micropuncture kit and the modified Seldinger technique, access was gained to the right common femoral artery and an 8 French sheath was placed. Under fluoroscopy, an 8 [place] Walrus balloon guide catheter was navigated over a 6 [place] Berenstein 2 catheter and a 0.035" Terumo Glidewire into the aortic arch. The catheter was placed into the right common carotid artery and then advanced into the right internal carotid artery. The  inner catheter was removed. Frontal and lateral angiograms of the head were obtained. FINDINGS: 1. Increased tortuosity of the cervical right ICA. 2. Occlusion of the distal right M1/MCA. PROCEDURE: Under biplane roadmap, a zoom 71 aspiration catheter was navigated over a phenom 21 microcatheter and a synchro support microguidewire into the cavernous segment of the right ICA. The microcatheter was then navigated over the wire into the right M1 segment. Then, the aspiration catheter was advanced to the level of occlusion and connected to a penumbra aspiration pump. The guiding catheter balloon was inflated. The aspiration catheter was removed under constant aspiration. 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 Under biplane roadmap, a zoom 71 aspiration catheter was navigated over a phenom 21 microcatheter and a synchro support microguidewire into the cavernous segment of the right ICA. The microcatheter was then navigated over the wire into the right M1 segment. Then, the aspiration catheter was advanced to the level of occlusion and connected to a penumbra aspiration pump. The guiding catheter balloon was inflated. The aspiration catheter was removed under constant aspiration. 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 Under biplane roadmap, a zoom 71 aspiration catheter was navigated over a phenom 21 microcatheter and a synchro support microguidewire into the cavernous segment of the right ICA. The microcatheter was then navigated over the wire into the right M2/MCA middle division branch. Then, a 6 mm embotrap stent retriever was deployed spanning the distal M1 and M2 segment. The device was allowed to intercalated with the clot for 4 minutes. The microcatheter was removed. The aspiration catheter was advanced to the level of occlusion and  connected to a penumbra aspiration pump. The guiding catheter balloon was inflated. The thrombectomy device and aspiration catheter were removed under constant aspiration. 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 Under biplane roadmap, a zoom 71 aspiration catheter was navigated over a phenom 21 microcatheter and a synchro support microguidewire into the cavernous segment of the right ICA. The microcatheter was then navigated over the wire into the right M2/MCA middle division branch. Then, a 5 x 37 mm embotrap stent retriever was deployed spanning the distal M1 and M2 segment. The device was allowed to intercalated with the clot for 4 minutes. The microcatheter was removed. The aspiration catheter was advanced to the level of occlusion and connected to a penumbra aspiration pump. The guiding catheter balloon was inflated. The thrombectomy device and aspiration catheter were removed  under constant aspiration. 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 Delayed follow-up showed reocclusion. Under biplane roadmap, a zoom 71 aspiration catheter was navigated over a phenom 21 microcatheter and a synchro support microguidewire into the cavernous segment of the right ICA. The microcatheter was then navigated over the wire into the right M2/MCA middle division branch. Then, a 5 x 37 mm embotrap stent retriever was deployed spanning the distal M1 and M2 segment. The device was allowed to intercalated with the clot for 4 minutes. The microcatheter was removed. The aspiration catheter was advanced to the level of occlusion and connected to a penumbra aspiration pump. The guiding catheter balloon was inflated. The thrombectomy device and  aspiration catheter were removed under constant aspiration. 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 Delayed follow-up showed reocclusion. Under biplane roadmap, a zoom 71 aspiration catheter was navigated over a phenom 21 microcatheter and a synchro support microguidewire into the cavernous segment of the right ICA. The microcatheter was then navigated over the wire into the right M2/MCA posterior division branch. Then, a 5 x 37 mm embotrap stent retriever was deployed spanning the distal M1 and M2 segment. The device was allowed to intercalated with the clot for 4 minutes. The microcatheter was removed. The aspiration catheter was advanced to the level of occlusion and connected to a penumbra aspiration pump. The guiding catheter balloon was inflated. The thrombectomy device and aspiration catheter were removed under constant aspiration. 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 Delayed follow-up showed reocclusion. Flat panel CT of the head was obtained and post processed in a separate workstation with concurrent attending physician supervision. Selected images were sent to PACS. No evidence of complication. Under biplane roadmap, a zoom 71 aspiration catheter was navigated over a phenom 21 microcatheter and a synchro support microguidewire into the cavernous segment of the right ICA. The microcatheter was then navigated over the wire into the right M2/MCA posterior division branch. Then, a tiger 7 stent retriever was deployed spanning the distal M1 and M2 segment. The device was allowed to intercalated with the clot for 4 minutes. The microcatheter was removed. The aspiration catheter was advanced to the level of occlusion and  connected to a penumbra aspiration pump. The guiding catheter balloon was inflated. The thrombectomy device and aspiration catheter were removed under constant aspiration. 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 Delayed follow-up showed reocclusion. Under biplane roadmap, a zoom 71 aspiration catheter was navigated over a phenom 21 microcatheter and a synchro support microguidewire into the cavernous segment of the right ICA. The microcatheter was then navigated over the wire into the right M2/MCA posterior division branch. Then, a 4 x 40 mm solitaire stent retriever was deployed spanning the distal M1 and M2 segment. The device was allowed to intercalated with the clot for 4 minutes. The microcatheter was removed. The aspiration catheter was advanced to the level of occlusion and connected to a penumbra aspiration pump. The guiding catheter balloon was inflated. The thrombectomy device and aspiration catheter were removed under constant aspiration.  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 Delayed follow-up showed reocclusion. Under biplane roadmap, a zoom 71 aspiration catheter was navigated over a phenom 21 microcatheter and a synchro support microguidewire into the cavernous segment of the right ICA. The microcatheter was then navigated over the wire into the right M2/MCA middle division branch. Then, a 4 x 40 mm solitaire stent retriever was deployed spanning the distal M1 and M2 segment. The device was allowed to intercalated with the clot for 4 minutes. The microcatheter was removed. The aspiration catheter was advanced to the level of occlusion and connected to a penumbra aspiration pump. The guiding catheter balloon was  inflated. The thrombectomy device and aspiration catheter were removed under constant aspiration. 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 Delayed follow-up showed reocclusion. Flat panel CT of the head was obtained and post processed in a separate workstation with concurrent attending physician supervision. Selected images were sent to PACS. Small contrast extravasation seen into the right sylvian fissure. The catheter construct was subsequently withdrawn. Right common femoral artery angiograms were obtained frontal and lateral views. The femoral sheath was then exchanged over the wire for a Perclose ProGlide which was utilized for access closure. Immediate hemostasis was achieved. IMPRESSION: Unsuccessful mechanical thrombectomy for treatment of a distal right M1/MCA occlusion. Multiple attempts performed with temporary partial recanalization followed by subsequent reocclusion. A total of 8 passes were performed in different branches without recanalization (TICI0). PLAN: Patient was transferred to ICU for continued care. Electronically Signed   By: Pedro Earls M.D.   On: 04/03/2020 13:11   IR PERCUTANEOUS ART THROMBECTOMY/INFUSION INTRACRANIAL INC DIAG ANGIO  Result Date: 04/03/2020 INDICATION: 75 year old male with past medical history is significant for UTI, urinary retention, recent hematuria and appendectomy presenting with sudden onset left-sided hemiplegia. Left known well at 11:25 a.m. on 04/02/2020. No IV tPA given due to recent hematuria. Premorbid Rankin scale 0; NIHSS 15. Head CT showed early ischemic changes in the frontoparietal and insular cortex (aspects 6) and CT angiogram of the head and neck showed a distal right M1/MCA a with poor collaterals. He was transferred to our service for a mechanical thrombectomy. EXAM: Diagnostic  cerebral angiogram Mechanical thrombectomy Flat panel head CT COMPARISON:  CT/CT angiogram of head and neck April 02, 2020. MEDICATIONS: 10 mg of verapamil intra arterial ANESTHESIA/SEDATION: The procedure was performed in the general anesthesia. CONTRAST:  53m of Omnipaque 300 FLUOROSCOPY TIME:  Fluoroscopy Time:  minutes  seconds ( mGy). COMPLICATIONS: SIR Level A - No therapy, no consequence. TECHNIQUE: Informed written consent was obtained from the patient's daughter after a thorough discussion of the procedural risks, benefits and alternatives. All questions were addressed. Maximal Sterile Barrier Technique was utilized including caps, mask, sterile gowns, sterile gloves, sterile drape, hand hygiene and skin antiseptic. A timeout was performed prior to the initiation of the procedure. The right groin was prepped and draped in the usual sterile fashion. Using a micropuncture kit and the modified Seldinger technique, access was gained to the right common femoral artery and an 8 French sheath was placed. Under fluoroscopy, an 8 [place] balloon guide catheter was navigated over a 6 [place] 2 catheter and a 0.035" Terumo Glidewire into the aortic arch. The catheter was placed into the  right common carotid artery and then advanced into the right internal carotid artery. The inner catheter was removed. Frontal and lateral angiograms of the head were obtained. FINDINGS: 1. Increased tortuosity of the cervical right ICA. 2. Occlusion of the distal right M1/MCA. PROCEDURE: Under biplane roadmap, a zoom 71 aspiration catheter was navigated over a phenom 21 microcatheter and a synchro support microguidewire into the cavernous segment of the right ICA. The microcatheter was then navigated over the wire into the right M1 segment. Then, the aspiration catheter was advanced to the level of occlusion and connected to a penumbra aspiration pump. The guiding catheter balloon was inflated. The aspiration  catheter was removed under constant aspiration. 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 Under biplane roadmap, a zoom 71 aspiration catheter was navigated over a phenom 21 microcatheter and a synchro support microguidewire into the cavernous segment of the right ICA. The microcatheter was then navigated over the wire into the right M1 segment. Then, the aspiration catheter was advanced to the level of occlusion and connected to a penumbra aspiration pump. The guiding catheter balloon was inflated. The aspiration catheter was removed under constant aspiration. 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 Under biplane roadmap, a zoom 71 aspiration catheter was navigated over a phenom 21 microcatheter and a synchro support microguidewire into the cavernous segment of the right ICA. The microcatheter was then navigated over the wire into the right M2/MCA middle division branch. Then, a 6 mm embotrap stent retriever was deployed spanning the distal M1 and M2 segment. The device was allowed to intercalated with the clot for 4 minutes. The microcatheter was removed. The aspiration catheter was advanced to the level of occlusion and connected to a penumbra aspiration pump. The guiding catheter balloon was inflated. The thrombectomy device and aspiration catheter were removed under constant aspiration. 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 Under biplane roadmap, a zoom 71 aspiration catheter was navigated over a phenom 21 microcatheter and a synchro support microguidewire into the cavernous segment of the right ICA. The microcatheter was then navigated over the wire into the right M2/MCA middle division  branch. Then, a 5 x 37 mm embotrap stent retriever was deployed spanning the distal M1 and M2 segment. The device was allowed to intercalated with the clot for 4 minutes. The microcatheter was removed. The aspiration catheter was advanced to the level of occlusion and connected to a penumbra aspiration pump. The guiding catheter balloon was inflated. The thrombectomy device and aspiration catheter were removed under constant aspiration. 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 Delayed follow-up showed reocclusion. Under biplane roadmap, a zoom 71 aspiration catheter was navigated over a phenom 21 microcatheter and a synchro support microguidewire into the cavernous segment of the right ICA. The microcatheter was then navigated over the wire into the right M2/MCA middle division branch. Then, a 5 x 37 mm embotrap stent retriever was deployed spanning the distal M1 and M2 segment. The device was allowed to intercalated with the clot for 4 minutes. The microcatheter was removed. The aspiration catheter was advanced to the level of occlusion and connected to a penumbra  aspiration pump. The guiding catheter balloon was inflated. The thrombectomy device and aspiration catheter were removed under constant aspiration. 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 Delayed follow-up showed reocclusion. Under biplane roadmap, a zoom 71 aspiration catheter was navigated over a phenom 21 microcatheter and a synchro support microguidewire into the cavernous segment of the right ICA. The microcatheter was then navigated over the wire into the right M2/MCA posterior division branch. Then, a 5 x 37 mm embotrap stent retriever was deployed spanning the  distal M1 and M2 segment. The device was allowed to intercalated with the clot for 4 minutes. The microcatheter was removed. The aspiration catheter was advanced to the level of occlusion and connected to a penumbra aspiration pump. The guiding catheter balloon was inflated. The thrombectomy device and aspiration catheter were removed under constant aspiration. 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 Delayed follow-up showed reocclusion. Flat panel CT of the head was obtained and post processed in a separate workstation with concurrent attending physician supervision. Selected images were sent to PACS. No evidence of complication. Under biplane roadmap, a zoom 71 aspiration catheter was navigated over a phenom 21 microcatheter and a synchro support microguidewire into the cavernous segment of the right ICA. The microcatheter was then navigated over the wire into the right M2/MCA posterior division branch. Then, a tiger 7 stent retriever was deployed spanning the distal M1 and M2 segment. The device was allowed to intercalated with the clot for 4 minutes. The microcatheter was removed. The aspiration catheter was advanced to the level of occlusion and connected to a penumbra aspiration pump. The guiding catheter balloon was inflated. The thrombectomy device and aspiration catheter were removed under constant aspiration. 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 Delayed follow-up showed reocclusion. Under biplane roadmap, a zoom 71 aspiration catheter was navigated over a phenom 21 microcatheter and a synchro support microguidewire into the cavernous segment of the right ICA. The microcatheter was then navigated over the wire into  the right M2/MCA posterior division branch. Then, a 4 x 40 mm solitaire stent retriever was deployed spanning the distal M1 and M2 segment. The device was allowed to intercalated with the clot for 4 minutes. The microcatheter was removed. The aspiration catheter was advanced to the level of occlusion and connected to a penumbra aspiration pump. The guiding catheter balloon was inflated. The thrombectomy device and aspiration catheter were removed under constant aspiration. 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 Delayed follow-up showed reocclusion. Under biplane roadmap, a zoom 71 aspiration catheter was navigated over a phenom 21 microcatheter and a synchro support microguidewire into the cavernous segment of the right ICA. The microcatheter was then navigated over the wire into the right M2/MCA middle division branch. Then, a 4 x 40 mm solitaire stent retriever was deployed spanning the distal M1 and M2 segment. The device was allowed to intercalated with the clot for 4 minutes. The microcatheter was removed. The aspiration catheter was advanced to the level of occlusion and connected to a penumbra aspiration pump. The  guiding catheter balloon was inflated. The thrombectomy device and aspiration catheter were removed under constant aspiration. 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 Delayed follow-up showed reocclusion. Flat panel CT of the head was obtained and post processed in a separate workstation with concurrent attending physician supervision. Selected images were sent to PACS. Small contrast extravasation seen into the right sylvian fissure. The catheter construct was subsequently withdrawn. Right common femoral artery  angiograms were obtained frontal and lateral views. The femoral sheath was then exchanged over the wire for a Perclose ProGlide which was utilized for access closure. Immediate hemostasis was achieved. IMPRESSION: Unsuccessful mechanical thrombectomy for treatment of a distal right M1/MCA occlusion. Multiple attempts performed with temporary partial recanalization followed by subsequent reocclusion. A total of 8 passes were performed in different branches without recanalization (TICI0). PLAN: Patient was transferred to ICU for continued care. Electronically Signed   By: Pedro Earls M.D.   On: 04/03/2020 13:11    Labs:  CBC: Recent Labs    04/02/20 2208  WBC 14.8*  HGB 14.9  HCT 42.4  PLT 244    COAGS: Recent Labs    04/02/20 2208  INR 1.0  APTT 29    BMP: Recent Labs    04/02/20 2208 04/03/20 1005 04/03/20 1652 04/03/20 1827 04/04/20 0233  NA 139 141 143 142 145  K 3.7 3.6  --   --  3.3*  CL 106 108  --   --  113*  CO2 21* 23  --   --  24  GLUCOSE 182* 125*  --   --  123*  BUN 7* 8  --   --  9  CALCIUM 8.5* 8.8*  --   --  8.6*  CREATININE 0.90 0.93  --   --  0.88  GFRNONAA >60 >60  --   --  >60    LIVER FUNCTION TESTS: Recent Labs    04/02/20 2208  BILITOT 0.8  AST 29  ALT 18  ALKPHOS 86  PROT 6.5  ALBUMIN 3.4*    Assessment and Plan:  History of acute CVA s/p cerebral arteriogram with emergent mechanical thrombectomy of right MCA M1 occlusion with subsequent reocclusion (despite 8 passes) via right femoral approach 04/02/2020 by Dr. Karenann Cai. Patient's condition stable- awake and alert following intermittent commands, can spontaneously move right side with no spontaneous movements of left side, left facial droop. Right femoral puncture site stable, distal pulses (DPs) 2+ bilaterally. Neurology aware of headache. For swallow evaluation today. Plan to follow-up with Dr. Karenann Cai in clinic 4 weeks after discharge ([facility]  schedulers to call patient to set up this appointment). Further plans per neurology/CCM- appreciate and agree with management. Please call [facility] with questions/concerns.  Tele-interpretor [phone] was used throughout today's interaction.   Electronically Signed: Earley Abide, PA-C 04/04/2020, 10:27 AM   I spent a total of 25 Minutes at the the patient's bedside AND on the patient's hospital floor or unit, greater than 50% of which was counseling/coordinating care for CVA s/p revascularization.

## 2020-04-05 ENCOUNTER — Inpatient Hospital Stay (HOSPITAL_COMMUNITY): Payer: Medicare HMO

## 2020-04-05 DIAGNOSIS — G936 Cerebral edema: Secondary | ICD-10-CM

## 2020-04-05 DIAGNOSIS — I63011 Cerebral infarction due to thrombosis of right vertebral artery: Secondary | ICD-10-CM | POA: Diagnosis not present

## 2020-04-05 DIAGNOSIS — G935 Compression of brain: Secondary | ICD-10-CM

## 2020-04-05 DIAGNOSIS — I63511 Cerebral infarction due to unspecified occlusion or stenosis of right middle cerebral artery: Secondary | ICD-10-CM | POA: Diagnosis not present

## 2020-04-05 LAB — SODIUM
Sodium: 149 mmol/L — ABNORMAL HIGH (ref 135–145)
Sodium: 150 mmol/L — ABNORMAL HIGH (ref 135–145)
Sodium: 155 mmol/L — ABNORMAL HIGH (ref 135–145)

## 2020-04-05 LAB — GLUCOSE, CAPILLARY: Glucose-Capillary: 92 mg/dL (ref 70–99)

## 2020-04-05 LAB — CBC
HCT: 39.8 % (ref 39.0–52.0)
Hemoglobin: 13.7 g/dL (ref 13.0–17.0)
MCH: 29.5 pg (ref 26.0–34.0)
MCHC: 34.4 g/dL (ref 30.0–36.0)
MCV: 85.6 fL (ref 80.0–100.0)
Platelets: 211 10*3/uL (ref 150–400)
RBC: 4.65 MIL/uL (ref 4.22–5.81)
RDW: 12.3 % (ref 11.5–15.5)
WBC: 14.2 10*3/uL — ABNORMAL HIGH (ref 4.0–10.5)
nRBC: 0 % (ref 0.0–0.2)

## 2020-04-05 LAB — BASIC METABOLIC PANEL
Anion gap: 12 (ref 5–15)
BUN: 8 mg/dL (ref 8–23)
CO2: 21 mmol/L — ABNORMAL LOW (ref 22–32)
Calcium: 9.1 mg/dL (ref 8.9–10.3)
Chloride: 115 mmol/L — ABNORMAL HIGH (ref 98–111)
Creatinine, Ser: 0.79 mg/dL (ref 0.61–1.24)
GFR, Estimated: >60 mL/min (ref >60)
Glucose, Bld: 106 mg/dL — ABNORMAL HIGH (ref 70–99)
Potassium: 3.2 mmol/L — ABNORMAL LOW (ref 3.5–5.1)
Sodium: 148 mmol/L — ABNORMAL HIGH (ref 135–145)

## 2020-04-05 IMAGING — CT CT HEAD W/O CM
3 of 4 series · 13 of 47 positions shown, 15 images · non-contrast
Comparison: [DATE]

CLINICAL DATA: Stroke follow-up

EXAM:
CT HEAD WITHOUT CONTRAST
TECHNIQUE: Contiguous axial images were obtained from the base of the skull
through the vertex without intravenous contrast.

[Series 3: head without · axial · non-contrast · 0.46mm/px · z∈[-80,+50]mm · 7 of 36 slices shown, 9 images]
[im 5/36  brain]
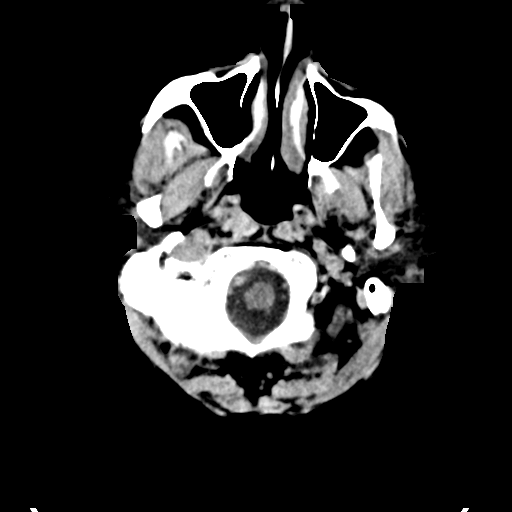
[im 5/36  bone]
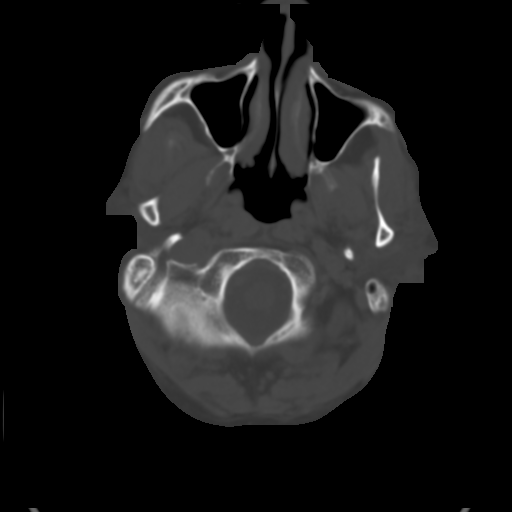
[im 9/36  brain]
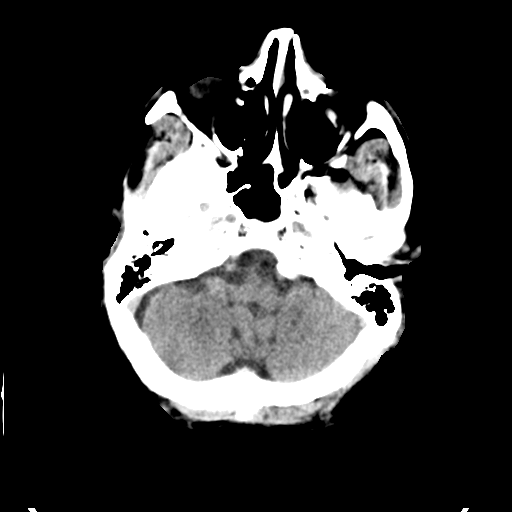
[im 14/36  brain]
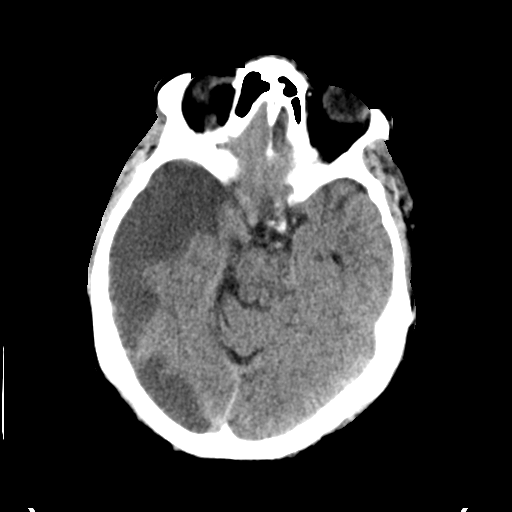
[im 18/36  brain]
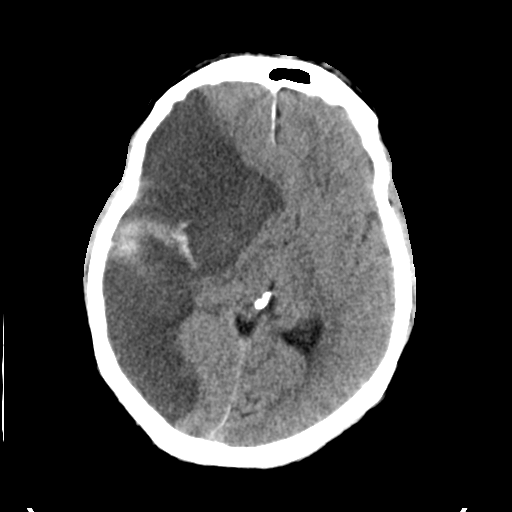
[im 22/36  brain]
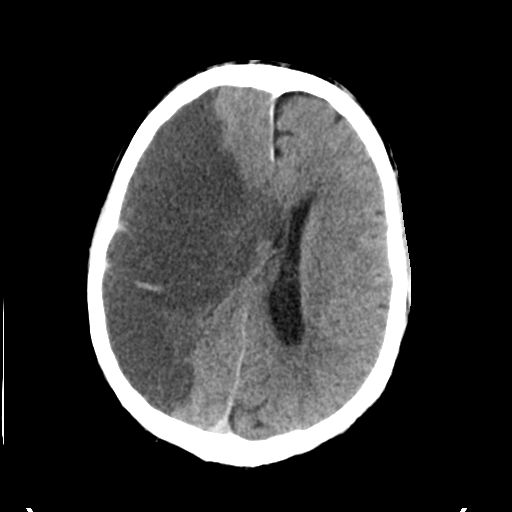
[im 22/36  bone]
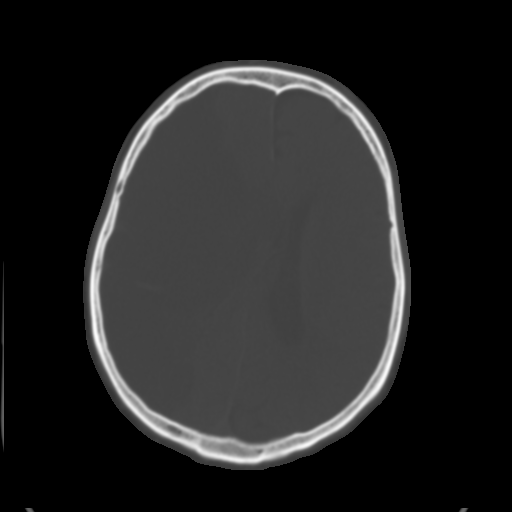
[im 27/36  brain]
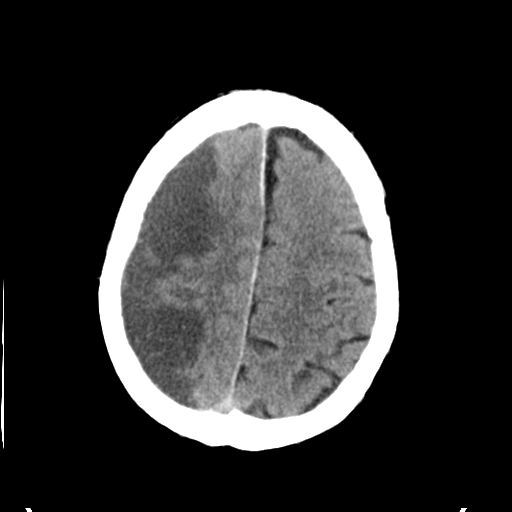
[im 31/36  brain]
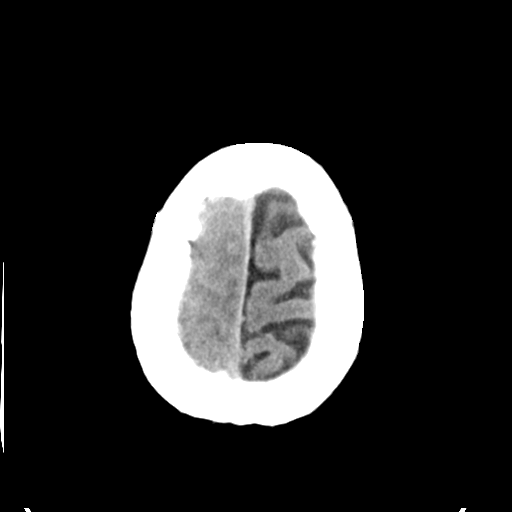

[Series 5: head without cor · coronal · non-contrast · 0.35mm/px · 3 of 81 slices shown]
[im 27/81  brain]
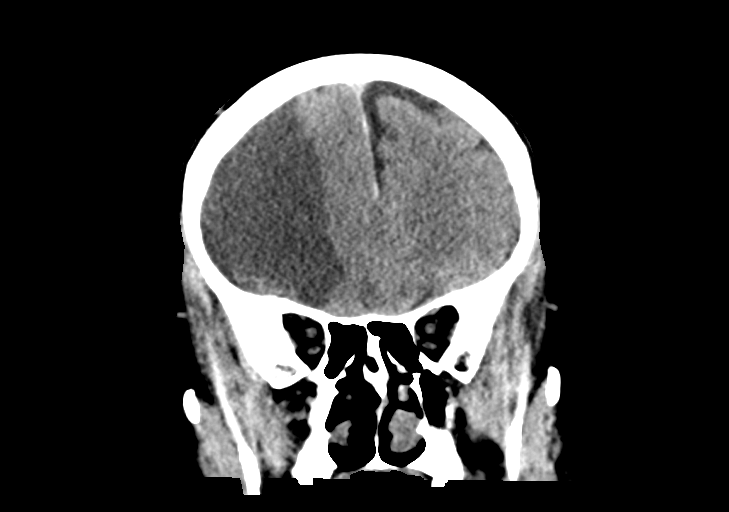
[im 36/81  brain]
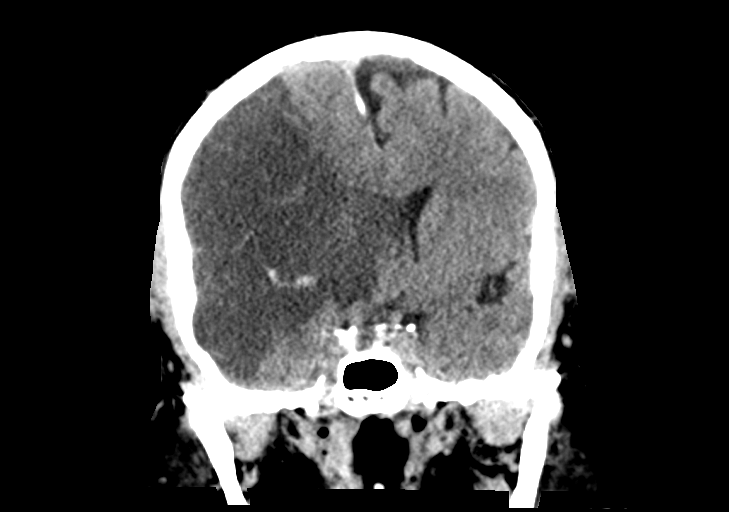
[im 45/81  brain]
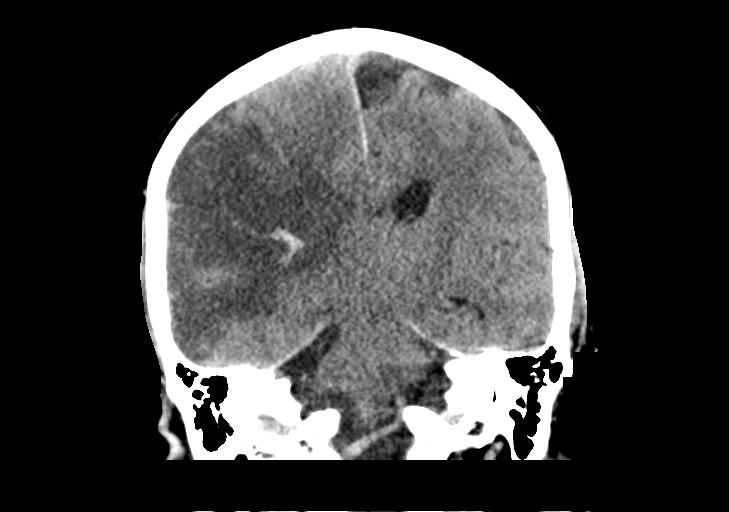

[Series 6: head without sag · sagittal · non-contrast · 0.35mm/px · 3 of 67 slices shown]
[im 23/67  brain]
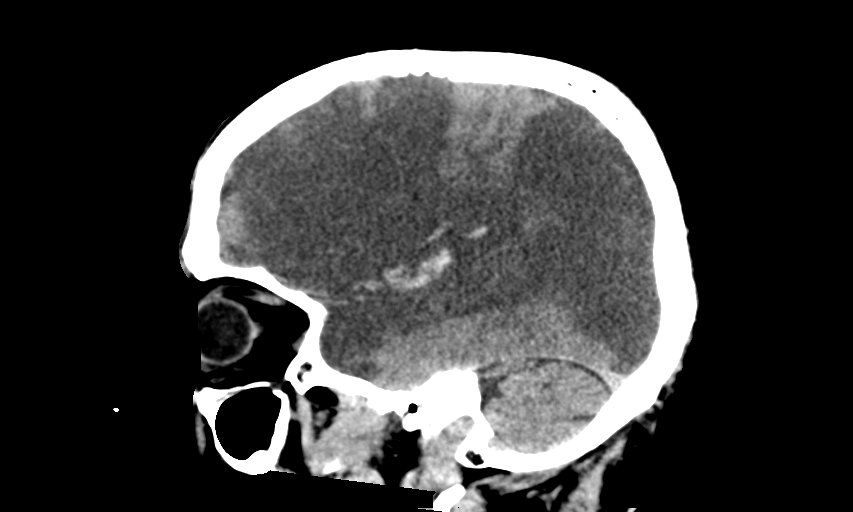
[im 34/67  brain]
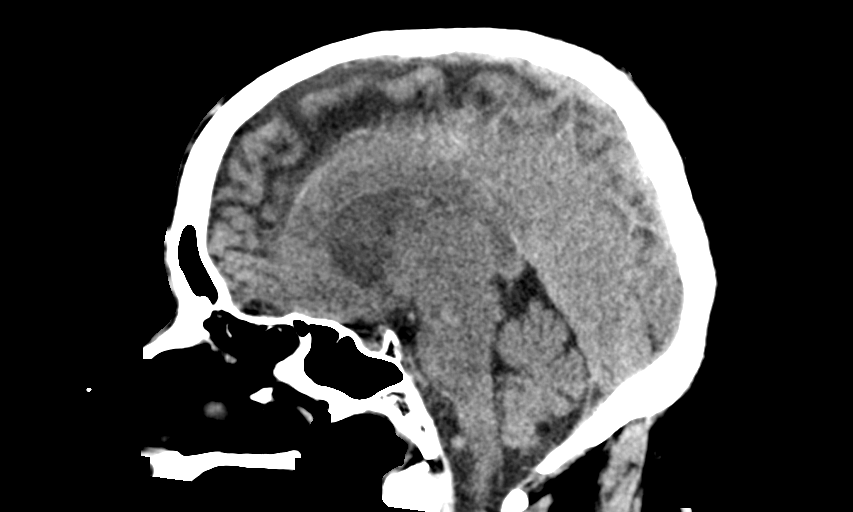
[im 45/67  brain]
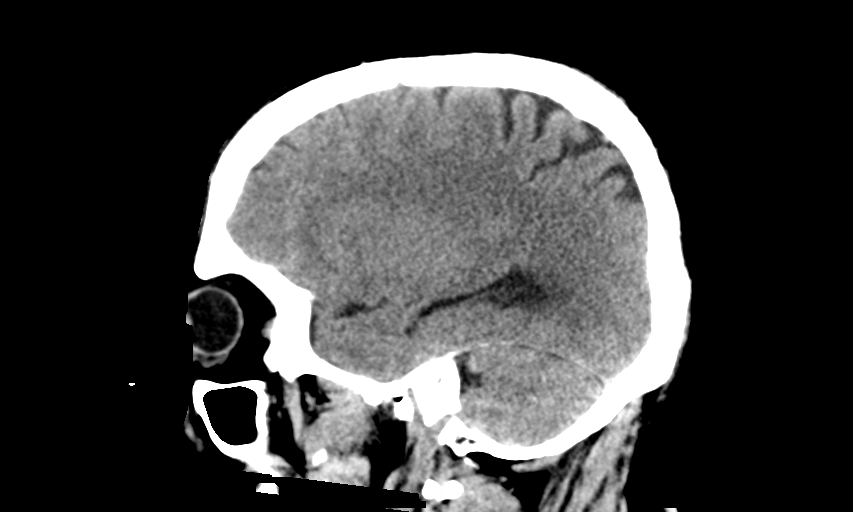

[13 of 47 positions shown; findings below may reference images not displayed]

FINDINGS: Brain: Expected evolution of large right MCA territory infarct.
Unchanged appearance contrast staining/blood within the midportion
of the infarcted territory. No acute hemorrhage. Leftward midline
shift measures 12 mm, previously 6 mm. There is subfalcine
herniation of the cingulate gyrus. No hydrocephalus. Right lateral
ventricle is nearly effaced.

Vascular: No hyperdense vessel or unexpected calcification.

Skull: Normal. Negative for fracture or focal lesion.

Sinuses/Orbits: No acute finding.

Other: None.
IMPRESSION: 1. Worsened leftward midline shift measures 12 mm, previously 6 mm.
Subfalcine herniation of the right cingulate gyrus.
2. Large right MCA territory infarct with unchanged appearance of
contrast staining/blood within the midportion of the infarcted
territory.

## 2020-04-05 MED ORDER — VALPROATE SODIUM 100 MG/ML IV SOLN
250.0000 mg | Freq: Three times a day (TID) | INTRAVENOUS | Status: DC
  Administered 2020-04-06 – 2020-04-08 (×7): 250 mg via INTRAVENOUS
  Filled 2020-04-05 (×10): qty 2.5

## 2020-04-05 MED ORDER — POTASSIUM CHLORIDE CRYS ER 20 MEQ PO TBCR
20.0000 meq | EXTENDED_RELEASE_TABLET | Freq: Once | ORAL | Status: DC
  Filled 2020-04-05: qty 1

## 2020-04-05 MED ORDER — POTASSIUM CHLORIDE 20 MEQ PO PACK: 20.0000 meq | PACK | Freq: Once | ORAL | Status: DC

## 2020-04-05 MED ORDER — POTASSIUM CHLORIDE 10 MEQ/100ML IV SOLN
10.0000 meq | INTRAVENOUS | Status: AC
  Administered 2020-04-05 (×4): 10 meq via INTRAVENOUS
  Filled 2020-04-05 (×4): qty 100

## 2020-04-05 MED ORDER — SODIUM CHLORIDE 23.4 % INJECTION (4 MEQ/ML) FOR IV ADMINISTRATION
120.0000 meq | Freq: Once | INTRAVENOUS | Status: AC
  Administered 2020-04-05: 120 meq via INTRAVENOUS
  Filled 2020-04-05: qty 30

## 2020-04-05 MED ORDER — SODIUM CHLORIDE 3 % IV SOLN
INTRAVENOUS | Status: DC
  Filled 2020-04-05 (×2): qty 500

## 2020-04-05 MED ORDER — ASPIRIN 300 MG RE SUPP
300.0000 mg | Freq: Every day | RECTAL | Status: DC
  Administered 2020-04-05 – 2020-04-07 (×3): 300 mg via RECTAL
  Filled 2020-04-05 (×2): qty 1

## 2020-04-05 MED ORDER — HYDRALAZINE HCL 20 MG/ML IJ SOLN
5.0000 mg | INTRAMUSCULAR | Status: DC | PRN
  Administered 2020-04-05 (×2): 10 mg via INTRAVENOUS
  Administered 2020-04-06 (×2): 20 mg via INTRAVENOUS
  Filled 2020-04-05 (×3): qty 1

## 2020-04-05 NOTE — Progress Notes (Signed)
Pt more lethargic, CCM & Neuro notified. 3% increased to 130ml/hr, pt only has PIVs, infuse 3% at 88ml/hr in R PIV and 52ml/hr in L PIV per Dr.Argarwala. Family meeting to come.

## 2020-04-05 NOTE — Progress Notes (Signed)
Repeat sodium of 146. Dr. Amada Jupiter notified. 3% hypertonic saline infusion restarted per Dr. Amada Jupiter. Will continue to monitor.

## 2020-04-05 NOTE — Progress Notes (Signed)
SBP elevated, Dr.Xu notified, see new orders

## 2020-04-05 NOTE — Progress Notes (Addendum)
NAME:  Gregory Osborn, MRN:  [id number], DOB:  1945/12/04, LOS: 3 ADMISSION DATE:  04/02/2020, CONSULTATION DATE:  04/03/2020 REFERRING MD:  Pearlean Brownie, CHIEF COMPLAINT:  R M1 occlusion   Brief History:  75 year old male who presented to [facility] with L hemiplegia, L facial droop, slurred speech after fall. 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 TPA not given due to hematuria history. Emergently transferred to [facility] IR for thrombectomy (unsuccessful).  History of Present Illness:  [name] is a 75 year old male with history of HTN (recently started on lisinopril), BPH, urinary retention with frequent UTIs (recently utilizing an indwelling Foley catheter at home, followed by Urology) and hematuria (likely 2/2 traumatic catheterization). Per family he was doing well overall 2/16 until 1125 when he was reportedly had a fall at home. Family found him with new onset of L-sided paralysis, L facial droop and slurred speech and called EMS. He was taken to [facility] for evaluation and diagnosed with R M1 occlusion. No TPA was administered, given patient's recent report of hematuria. He was emergently transferred to [facility] for IR thrombectomy/recanalization.   Per IR procedure note, temporary recanalization was achieved multiple times but resulted in subsequent reocclusion (8 pass attempts made). Post-procedure, patient was transferred to 4N. PCCM was consulted 2/17 in the setting of expected post-stroke edema for possible need for reintubation and ventilatory support.  Today, patient reports ongoing LUE/LLE weakness and chest discomfort (reproducible). Denies recent fever/chills, SOB/dyspnea, n/v/d, sick contacts. He has had some urinary symptoms (hesitancy, hematuria) as noted above for which he is well-established with urology.  Past Medical History:  HTN, urinary retention with frequent UTIs, BPH, hematuria  Significant Hospital Events:  2/16 Presented to [facility] with L hemiplegia/facial droop, LKW 2/16 1125. Dx with  R M1 occlusion, transferred to [facility], IR attempted thrombectomy (unsuccessful)  Consults:  PCCM  Procedures:  ETT 2/16  Significant Diagnostic Tests:   CT Head 2/16 >> complete R MCA territory infarction with at least partial involvement of the basal ganglia, areas of hyperdensity in the deep insula/frontoparietal junction could represent a combination of contrast staining and small amount of hemorrhage, no large confluent hematoma, mild swelling with early mass effect, R to L shift of 26mm  IR Percutaneous Art Thrombectomy 2/16  Micro Data:  2/16 COVID >> negative  Antimicrobials:  N/A  Interim History / Subjective:  Acute worsening overnight. Patient now stuporous.  CT head shows increasing midline shift.   Objective   Blood pressure (!) 181/73, pulse (!) 57, temperature 98.8 F (37.1 C), temperature source Axillary, resp. rate 16, height 5\' 5"  (1.651 m), weight 76.3 kg, SpO2 97 %.        Intake/Output Summary (Last 24 hours) at 04/05/2020 1431 Last data filed at 04/05/2020 0700 Gross per 24 hour  Intake 1390.81 ml  Output 3050 ml  Net -1659.19 ml   Filed Weights   04/02/20 1519 04/02/20 1730  Weight: 77.1 kg 76.3 kg   Examination: General: WDWN adult male, laying in bed, in NAD. HEENT: Anicteric sclera, PERRL. Moist mucous membranes. L-sided facial droop with rightward gaze preference. Neuro: Stuporous, moves right side only to painful stumuli CV: S1S2, RRR, no m/g/r. PULM: sonorous breathing.  GI: Soft, nontender, nondistended. Extremities: No LE edema noted. Profound weakness of LUE/LLE. Skin: Warm/dry, no rashes.  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  Urinary retention complicated by frequent UTI Hematuria BPH with chronic urinary  retention Hypertension  Plan:  -Increase  3% hypertonic saline  -Prognosis is poor. Patient has been declined for hemicraniectomy - will redirect goals of care as no exit strategy at this time.   Best practice (evaluated daily)  Diet: NPO, Pain/Anxiety/Delirium protocol (if indicated): N/A VAP protocol (if indicated): N/A DVT prophylaxis: Lovenox GI prophylaxis: N/A Glucose control: SSI PRN Mobility: Bedrest Disposition: ICU  Goals of Care:  Last date of multidisciplinary goals of care discussion: With Neurology 2/19 Family and staff present: son and wife Summary of discussion: per Neurology Follow up goals of care discussion due: 2/23 Code Status: Full  Labs   CBC: Recent Labs  Lab 04/02/20 2208 04/05/20 0304  WBC 14.8* 14.2*  NEUTROABS 14.0*  --   HGB 14.9 13.7  HCT 42.4 39.8  MCV 84.8 85.6  PLT 244 211    Basic Metabolic Panel: Recent Labs  Lab 04/02/20 2208 04/03/20 1005 04/03/20 1652 04/04/20 0233 04/04/20 0957 04/04/20 1516 04/04/20 2234 04/05/20 0304 04/05/20 0932  NA 139 141   < > 145 148* 145 146* 148* 149*  K 3.7 3.6  --  3.3*  --   --   --  3.2*  --   CL 106 108  --  113*  --   --   --  115*  --   CO2 21* 23  --  24  --   --   --  21*  --   GLUCOSE 182* 125*  --  123*  --   --   --  106*  --   BUN 7* 8  --  9  --   --   --  8  --   CREATININE 0.90 0.93  --  0.88  --   --   --  0.79  --   CALCIUM 8.5* 8.8*  --  8.6*  --   --   --  9.1  --    < > = values in this interval not displayed.   GFR: Estimated Creatinine Clearance: 79.6 mL/min (by C-G formula based on SCr of 0.79 mg/dL). Recent Labs  Lab 04/02/20 2208 04/05/20 0304  WBC 14.8* 14.2*    Liver Function Tests: Recent Labs  Lab 04/02/20 2208  AST 29  ALT 18  ALKPHOS 86  BILITOT 0.8  PROT 6.5  ALBUMIN 3.4*   No results for input(s): LIPASE, AMYLASE in the last 168 hours. No results for input(s): AMMONIA in the last 168 hours.  ABG No results found for: PHART, PCO2ART,  PO2ART, HCO3, TCO2, ACIDBASEDEF, O2SAT   Coagulation Profile: Recent Labs  Lab 04/02/20 2208  INR 1.0    Cardiac Enzymes: No results for input(s): CKTOTAL, CKMB, CKMBINDEX, TROPONINI in the last 168 hours.  HbA1C: Hgb A1c MFr Bld  Date/Time Value Ref Range Status  04/03/2020 10:05 AM 5.6 4.8 - 5.6 % Final    Comment:    (NOTE) Pre diabetes:          5.7%-6.4%  Diabetes:              >6.4%  Glycemic control for   <7.0% adults with diabetes     CBG: Recent Labs  Lab 04/02/20 2006  GLUCAP 176*    CRITICAL CARE Performed by: Lynnell Catalan   Total critical care time: 40 minutes  Critical care time was exclusive of separately billable procedures and treating other patients.  Critical care was necessary to treat or prevent imminent or life-threatening deterioration.  Critical care was time spent personally by me on the following activities: development of treatment plan with patient and/or surrogate as well as nursing, discussions with consultants, evaluation of patient's response to treatment, examination of patient, obtaining history from patient or surrogate, ordering and performing treatments and interventions, ordering and review of laboratory studies, ordering and review of radiographic studies, pulse oximetry, re-evaluation of patient's condition and participation in multidisciplinary rounds.  Lynnell Catalan, MD [facility] ICU Physician [facility] Critical Care  Pager: [phone] Mobile: [phone] After hours: [phone].

## 2020-04-05 NOTE — Progress Notes (Signed)
Dr. Amada Jupiter and [facility]-Link notified of patient experiencing heart rate of high 30s. Neuro exam unchanged. Labs to be drawn this AM. CT scan time moved to as soon as possible. Will continue to monitor.

## 2020-04-05 NOTE — Progress Notes (Addendum)
STROKE TEAM PROGRESS NOTE   INTERVAL HISTORY Gregory Osborn reported overnight to high 30s. HCT repeated showing increased midline shift to 42mm. Clinical decline appreciated with somnolence, non-verbal and not following commands this am.  Son at bedside. He reports his father has not responded to him or spontaneously spoken or moved this morning. Stroke diagnosis, plan of care  of worsening clinical status and brain imaging explained. Discussed goals of care and possibility of further worsening and death. He asks that his sister be allowed to come in and visit for further discussion and care planning.   Vitals:   04/05/20 0500 04/05/20 0600 04/05/20 0700 04/05/20 0800  BP: (!) 152/71 (!) 150/74 (!) 187/82   Pulse: (!) 48 (!) 50 63   Resp: 14 18 12    Temp:    98.8 F (37.1 C)  TempSrc:    Axillary  SpO2: 98% 97%  97%  Weight:      Height:       CBC:  Recent Labs  Lab 04/02/20 2208 04/05/20 0304  WBC 14.8* 14.2*  NEUTROABS 14.0*  --   HGB 14.9 13.7  HCT 42.4 39.8  MCV 84.8 85.6  PLT 244 211   Basic Metabolic Panel:  Recent Labs  Lab 04/04/20 0233 04/04/20 0957 04/04/20 2234 04/05/20 0304  NA 145   < > 146* 148*  K 3.3*  --   --  3.2*  CL 113*  --   --  115*  CO2 24  --   --  21*  GLUCOSE 123*  --   --  106*  BUN 9  --   --  8  CREATININE 0.88  --   --  0.79  CALCIUM 8.6*  --   --  9.1   < > = values in this interval not displayed.   Lipid Panel:  Recent Labs  Lab 04/03/20 1005  CHOL 197  TRIG 109  HDL 57  CHOLHDL 3.5  VLDL 22  LDLCALC 04/05/20*   HgbA1c:  Recent Labs  Lab 04/03/20 1005  HGBA1C 5.6   Urine Drug Screen:  Recent Labs  Lab 04/02/20 1809  LABOPIA NONE DETECTED  COCAINSCRNUR NONE DETECTED  LABBENZ NONE DETECTED  AMPHETMU NONE DETECTED  THCU NONE DETECTED  LABBARB NONE DETECTED    Alcohol Level  Recent Labs  Lab 04/02/20 2208  ETH <10    IMAGING past 24 hours CT Head Wo Contrast  Result Date: 04/05/2020 CLINICAL DATA:  Stroke  follow-up EXAM: CT HEAD WITHOUT CONTRAST TECHNIQUE: Contiguous axial images were obtained from the base of the skull through the vertex without intravenous contrast. COMPARISON:  04/04/2020 FINDINGS: Brain: Expected evolution of large right MCA territory infarct. Unchanged appearance contrast staining/blood within the midportion of the infarcted territory. No acute hemorrhage. Leftward midline shift measures 12 mm, previously 6 mm. There is subfalcine herniation of the cingulate gyrus. No hydrocephalus. Right lateral ventricle is nearly effaced. Vascular: No hyperdense vessel or unexpected calcification. Skull: Normal. Negative for fracture or focal lesion. Sinuses/Orbits: No acute finding. Other: None. IMPRESSION: 1. Worsened leftward midline shift measures 12 mm, previously 6 mm. Subfalcine herniation of the right cingulate gyrus. 2. Large right MCA territory infarct with unchanged appearance of contrast staining/blood within the midportion of the infarcted territory. Electronically Signed   By: 04/06/2020 M.D.   On: 04/05/2020 03:42   ECHOCARDIOGRAM COMPLETE  Result Date: 04/04/2020    ECHOCARDIOGRAM REPORT   Patient Name:   Gregory Osborn Date of Exam: 04/04/2020 Medical  Rec #:  [id number]    Height:       65.0 in Accession #:    [id number]   Weight:       168.2 lb Date of Birth:  06/09/1945    BSA:          1.838 m Patient Age:    75 years     BP:           169/105 mmHg Patient Gender: M            HR:           58 bpm. Exam Location:  Inpatient Procedure: 2D Echo, Cardiac Doppler and Color Doppler Indications:    Stroke 434.91 / I163.9  History:        Patient has no prior history of Echocardiogram examinations.  Sonographer:    Tiffany Dance Referring Phys: [id number] DELILA A BAILEY-MODZIK IMPRESSIONS  1. Left ventricular ejection fraction, by estimation, is 60 to 65%. The left ventricle has normal function. The left ventricle has no regional wall motion abnormalities. Left ventricular diastolic parameters  are consistent with Grade I diastolic dysfunction (impaired relaxation).  2. Right ventricular systolic function is normal. The right ventricular size is normal.  3. The mitral valve is normal in structure. No evidence of mitral valve regurgitation. No evidence of mitral stenosis.  4. The aortic valve is normal in structure. Aortic valve regurgitation is not visualized. No aortic stenosis is present.  5. The inferior vena cava is normal in size with greater than 50% respiratory variability, suggesting right atrial pressure of 3 mmHg. Conclusion(s)/Recommendation(s): No intracardiac source of embolism detected on this transthoracic study. A transesophageal echocardiogram is recommended to exclude cardiac source of embolism if clinically indicated. FINDINGS  Left Ventricle: Left ventricular ejection fraction, by estimation, is 60 to 65%. The left ventricle has normal function. The left ventricle has no regional wall motion abnormalities. The left ventricular internal cavity size was normal in size. There is  no left ventricular hypertrophy. Left ventricular diastolic parameters are consistent with Grade I diastolic dysfunction (impaired relaxation). Right Ventricle: The right ventricular size is normal. No increase in right ventricular wall thickness. Right ventricular systolic function is normal. Left Atrium: Left atrial size was normal in size. Right Atrium: Right atrial size was normal in size. Pericardium: There is no evidence of pericardial effusion. Mitral Valve: The mitral valve is normal in structure. No evidence of mitral valve regurgitation. No evidence of mitral valve stenosis. Tricuspid Valve: The tricuspid valve is normal in structure. Tricuspid valve regurgitation is not demonstrated. No evidence of tricuspid stenosis. Aortic Valve: The aortic valve is normal in structure. Aortic valve regurgitation is not visualized. No aortic stenosis is present. Pulmonic Valve: The pulmonic valve was normal in  structure. Pulmonic valve regurgitation is not visualized. No evidence of pulmonic stenosis. Aorta: The aortic root is normal in size and structure. Venous: The inferior vena cava is normal in size with greater than 50% respiratory variability, suggesting right atrial pressure of 3 mmHg. IAS/Shunts: No atrial level shunt detected by color flow Doppler.  LEFT VENTRICLE PLAX 2D LVIDd:         4.90 cm  Diastology LVIDs:         3.40 cm  LV e' medial:    10.00 cm/s LV PW:         1.10 cm  LV E/e' medial:  11.3 LV IVS:        1.00 cm  LV e' lateral:  10.30 cm/s LVOT diam:     2.30 cm  LV E/e' lateral: 11.0 LV SV:         120 LV SV Index:   66 LVOT Area:     4.15 cm  RIGHT VENTRICLE             IVC RV Basal diam:  2.30 cm     IVC diam: 1.80 cm RV S prime:     11.40 cm/s TAPSE (M-mode): 2.3 cm LEFT ATRIUM             Index       RIGHT ATRIUM           Index LA diam:        4.40 cm 2.39 cm/m  RA Area:     14.30 cm LA Vol (A2C):   50.8 ml 27.64 ml/m RA Volume:   31.00 ml  16.87 ml/m LA Vol (A4C):   43.6 ml 23.72 ml/m LA Biplane Vol: 47.4 ml 25.79 ml/m  AORTIC VALVE LVOT Vmax:   126.00 cm/s LVOT Vmean:  82.800 cm/s LVOT VTI:    0.290 m  AORTA Ao Root diam: 3.60 cm Ao Asc diam:  3.60 cm MITRAL VALVE MV Area (PHT): 2.73 cm     SHUNTS MV Decel Time: 278 msec     Systemic VTI:  0.29 m MV E velocity: 113.00 cm/s  Systemic Diam: 2.30 cm MV A velocity: 125.00 cm/s MV E/A ratio:  0.90 Donato SchultzMark Skains MD Electronically signed by Donato SchultzMark Skains MD Signature Date/Time: 04/04/2020/3:39:13 PM    Final    PHYSICAL EXAM  Temp:  [97.5 F (36.4 C)-99.3 F (37.4 C)] 98.8 F (37.1 C) (02/19 0800) Pulse Rate:  [45-88] 63 (02/19 0700) Resp:  [11-29] 12 (02/19 0700) BP: (150-196)/(67-113) 187/82 (02/19 0700) SpO2:  [95 %-98 %] 97 % (02/19 0800)  General - Well nourished, well developed elderly Hispanic male, in no apparent distress.   Ophthalmologic - fundi not visualized due to noncooperation.  Mental Status -  Somnolent, not  responsive to verbal stimuli or clap. Arouses briefly with eye opening to noxious stimuli. No verbal response to myself or son who also attempts to awaken patient with loud name calling and touch.  Cranial Nerves II - XII - II -  PERRL 3mm  III, IV, VI - Eyes appear to be in the midline V - Unable to assess facial sensation  VII -mild left lower facial weakness.   VIII - No response to name calling. UTA.  X -  XI -  XII - UTA   Motor Strength -  Dense left hemiplegia with hypotonia particular in the left upper extremity.  Withdraws  Withdraws to noxious stimuli LLE  Reflexes -depressed on the left and normal on the right  Sensory - Light touch, temperature/pinprick were assessed and were symmetrical     Coordination - left plegia   Gait and Station - deferred.  ASSESSMENT/PLAN Gregory Osborn is a 75 y.o. male with a history of recent urologic issues including UTI (01/22), urinary retention with foley cath placed, hematuria (likely traumatic due to self cath) s/p cystoscopy 04/01/20 by Dr. Pete GlatterStoneking [facility]  showing BPH with recommended surgical treatment. Surgical history includes appendectomy.He had been doing well up until 11:25am when he was noted to develop left hemiplegia. EMS was contacted, and [facility] where stroke workup was undertaken. He was noted to have left hemiplegia and neglect. NIHSS score 15.  He was not  given tPA due to the hematuria. Transferred urgently to [facility] for thrombectomy which was aborted after multiple attempts which were unsuccessful.   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  MRA of the neck shows no significant extracranial stenosis  MRI brain 2/17: Since the prior head CT of 04/02/2020, there  has been further progression of an acute/early subacute infarct involving the majority of the right MCA vascular territory. This infarct also involves the right MCA/ACA and MCA/PCA watershed territories. As compared to the prior CT, there is now more complete involvement of the right basal ganglia.Slightly increased mass effect with increased partial effacement of the right lateral ventricle and now 3-4 mm leftward midline shift.Unchanged small-volume subarachnoid hemorrhage within the right MCA cistern, sylvian fissure and along the right temporoparietal obes. Additional subcentimeter acute infarcts within the medial right temporal lobe (PCA vascular territory) and right cerebellar hemisphere.  Repeat HCT 2/18 Worsened leftward midline shift measures 12 mm, previously 6 mm. Subfalcine herniation of the right cingulate gyrus. 2. Large right MCA territory infarct with unchanged appearance of contrast staining/blood within the midportion of the infarcted territory.  2D Echo EF 60-65%, No shunt   LDL 118  HgbA1c 5.6  VTE prophylaxis   Dysphagia diet recommended however, patient        with neurologic decline will need to be NPO if not  improving.   On ASA 81 mg daily   Therapy recommendations:  CIR  Disposition:  TBD  Further Goals of Care discussion tomorrow         is pending   Cerebral Edema  Critical care medicine team consulted.  Repeat HCT shows worsening shift and edema  Hypertonic saline via 2 PIVs at 150cc/hr per  protocol added this am per CCM with serum  sodium goal 150-155, currently 148.   Family wish was  full aggressive measures    including hemicraniectomy if necessary initially. Now  pending further discussion regarding prognosis  tomorrow once family has a chance to gather  and discuss tomorrow.  Neurosurgery, Dr. Jake Samples,  consulted and  advised patient is not a candidate for surgical  intervention at this time.   Hypertension  Home meds:  Lisinopril 5mg  daily    BP 150-196/67-113 . Permissive hypertension (OK if < 220/120) but       gradually normalize in 5-7 days . Long-term BP goal normotensive  HLD:   LDL 118, no statin prior to admission  Plan pending patient status   Other Stroke Risk Factors   ETOH use, alcohol level <10, family reports he drinks regularly but not a problem.   Patient advised to stop using due to stroke risk.  Other Active Problems  Urinary retention d/t BPH present on admission  Developed urinary retention and UTI about a month ago necessitating trial of intermittent self cath (failed) and then subsequent foley placement  S/p cystoscopy 2/15 with recommended surgical intervention for BPH by Dr. 3/15 [facility])  Hematuria appears transient after attempts at self cath per urology notes review. Hematuria not present on on UA yesterday.   Continue foley catheter, Monitor for hematuria  Headache  Depakote po started yesterday for headache management.    Hospital day # 3  Delila A Bailey-Modzik, NP-C   ATTENDING NOTE: I reviewed above note and agree  with the assessment and plan. Pt was seen and examined.   75 year old male with history of BPH, UTI, status post Foley with hematuria status post cystoscopy 2/15 had acute onset left-sided weakness, left neglect.  CT showed right MCA infarct, status post IR, unfortunately unsuccessful.  MRI showed large right MCA infarct involving entire right MCA, consistent with malignant right MCA syndrome.  Small right SAH likely due to hemorrhagic conversion.  MRI showed right M1 occluded.  A1c 5.6, LDL 118.  EF 60 to 65%.  Neurosurgery consulted, however not felt to be a good candidate for hemicraniectomy.  Put on 3% saline for cerebral edema.  Sodium 148.  Creatinine 0.88.  WBC 14.8->14.2.  Temp:  [98.6 F (37 C)-99.3 F (37.4 C)] 98.8 F (37.1 C) (02/19 0800) Pulse Rate:  [45-72] 57 (02/19 1100) Resp:  [11-20] 16 (02/19 1100) BP: (137-188)/(67-105) 181/73 (02/19  1100) SpO2:  [95 %-98 %] 97 % (02/19 0800)  General - Well nourished, well developed, in no apparent distress.  Ophthalmologic - fundi not visualized due to noncooperation.  Cardiovascular - Regular rhythm and rate.  Neuro - obtunded, able to open eyes with strong pain stimulation or repeated voice stimulation. Severe dysarthria but able to tell her son's name. With repeated prompt, he was able to follow midline commands, open mouth and stick out tongue, pantomime movement of RUE and RLE, able to move toes on request however, not able to show me his fingers. Left UE and LE mild withdraw with strong pain stimulation. Sensation, coordination and gait not tested.  Per RN and the family, compared with yesterday, patient mental status much worse, repeat CT showed worsening midline shift, now 12 mm.  Combined with clinical and radiological progression, patient likely to have poor prognosis.  Discussed with son and daughter at bedside, will set up family meeting tomorrow for further discussion.  In the meantime, will start 23.4% bolus, with sodium goal 150-155.  BP still on the higher end, likely due to increased intracranial pressure.  Continue Plavix as needed with hydralazine as needed.  Marvel Plan, MD PhD Stroke Neurology 04/05/2020 9:50 PM  This patient is critically ill due to malignant right MCA syndrome, severe cerebral edema, brain herniation and SAH and at significant risk of neurological worsening, death form brain herniation, brain death. This patient's care requires constant monitoring of vital signs, hemodynamics, respiratory and cardiac monitoring, review of multiple databases, neurological assessment, discussion with family, other specialists and medical decision making of high complexity. I spent 45 minutes of neurocritical care time in the care of this patient. I had long discussion with sister and son at bedside, updated pt current condition, treatment plan and poor prognosis, and  answered all the questions.  They expressed understanding and appreciation.  Plan to have family meeting tomorrow.   To contact Stroke Continuity provider, please refer to [email or web address]. After hours, contact General Neurology

## 2020-04-05 NOTE — Progress Notes (Signed)
PT Cancellation Note  Patient Details Name: Gregory Osborn MRN: [id number] DOB: [birth year]   Cancelled Treatment:    Reason Eval/Treat Not Completed: Patient not medically ready;Fatigue/lethargy limiting ability to participate today per RN. PT will continue to follow and evaluate as appropriate.   Rolm Baptise, PT, DPT   Acute Rehabilitation Department Pager #: [phone]   Gaetana Michaelis 04/05/2020, 6:20 PM

## 2020-04-06 ENCOUNTER — Inpatient Hospital Stay: Payer: Self-pay

## 2020-04-06 DIAGNOSIS — R1312 Dysphagia, oropharyngeal phase: Secondary | ICD-10-CM

## 2020-04-06 DIAGNOSIS — G936 Cerebral edema: Secondary | ICD-10-CM | POA: Diagnosis not present

## 2020-04-06 DIAGNOSIS — I161 Hypertensive emergency: Secondary | ICD-10-CM

## 2020-04-06 DIAGNOSIS — G935 Compression of brain: Secondary | ICD-10-CM | POA: Diagnosis not present

## 2020-04-06 DIAGNOSIS — I63511 Cerebral infarction due to unspecified occlusion or stenosis of right middle cerebral artery: Secondary | ICD-10-CM | POA: Diagnosis not present

## 2020-04-06 DIAGNOSIS — I63011 Cerebral infarction due to thrombosis of right vertebral artery: Secondary | ICD-10-CM | POA: Diagnosis not present

## 2020-04-06 LAB — CBC
HCT: 43.6 % (ref 39.0–52.0)
Hemoglobin: 14.5 g/dL (ref 13.0–17.0)
MCH: 28.8 pg (ref 26.0–34.0)
MCHC: 33.3 g/dL (ref 30.0–36.0)
MCV: 86.5 fL (ref 80.0–100.0)
Platelets: 243 10*3/uL (ref 150–400)
RBC: 5.04 MIL/uL (ref 4.22–5.81)
RDW: 12.5 % (ref 11.5–15.5)
WBC: 10.3 10*3/uL (ref 4.0–10.5)
nRBC: 0 % (ref 0.0–0.2)

## 2020-04-06 LAB — BASIC METABOLIC PANEL
Anion gap: 12 (ref 5–15)
BUN: 14 mg/dL (ref 8–23)
BUN: 12 mg/dL (ref 8–23)
CO2: 21 mmol/L — ABNORMAL LOW (ref 22–32)
CO2: 19 mmol/L — ABNORMAL LOW (ref 22–32)
Calcium: 9.4 mg/dL (ref 8.9–10.3)
Calcium: 9.4 mg/dL (ref 8.9–10.3)
Chloride: 124 mmol/L — ABNORMAL HIGH (ref 98–111)
Chloride: >130 mmol/L (ref 98–111)
Creatinine, Ser: 0.77 mg/dL (ref 0.61–1.24)
Creatinine, Ser: 0.83 mg/dL (ref 0.61–1.24)
GFR, Estimated: >60 mL/min (ref >60)
GFR, Estimated: >60 mL/min (ref >60)
Glucose, Bld: 129 mg/dL — ABNORMAL HIGH (ref 70–99)
Glucose, Bld: 128 mg/dL — ABNORMAL HIGH (ref 70–99)
Potassium: 2.4 mmol/L — CL (ref 3.5–5.1)
Potassium: 2.8 mmol/L — ABNORMAL LOW (ref 3.5–5.1)
Sodium: 157 mmol/L — ABNORMAL HIGH (ref 135–145)
Sodium: 161 mmol/L (ref 135–145)

## 2020-04-06 LAB — SODIUM
Sodium: 153 mmol/L — ABNORMAL HIGH (ref 135–145)
Sodium: 156 mmol/L — ABNORMAL HIGH (ref 135–145)

## 2020-04-06 MED ORDER — SODIUM CHLORIDE 0.9% FLUSH
10.0000 mL | Freq: Two times a day (BID) | INTRAVENOUS | Status: DC
  Administered 2020-04-06 – 2020-04-07 (×3): 10 mL
  Administered 2020-04-07: 12:00:00 20 mL
  Administered 2020-04-08: 10 mL
  Administered 2020-04-08: 30 mL
  Administered 2020-04-09 – 2020-04-17 (×15): 10 mL
  Administered 2020-04-17: 30 mL
  Administered 2020-04-18: 10 mL

## 2020-04-06 MED ORDER — LIP MEDEX EX OINT: TOPICAL_OINTMENT | CUTANEOUS | Status: DC | PRN

## 2020-04-06 MED ORDER — POTASSIUM CHLORIDE 10 MEQ/50ML IV SOLN
10.0000 meq | INTRAVENOUS | Status: AC
  Administered 2020-04-06 (×4): 10 meq via INTRAVENOUS
  Filled 2020-04-06 (×4): qty 50

## 2020-04-06 MED ORDER — HEPARIN SODIUM (PORCINE) 5000 UNIT/ML IJ SOLN
5000.0000 [IU] | Freq: Three times a day (TID) | INTRAMUSCULAR | Status: DC
  Administered 2020-04-06 – 2020-04-25 (×57): 5000 [IU] via SUBCUTANEOUS
  Filled 2020-04-06 (×55): qty 1

## 2020-04-06 MED ORDER — LABETALOL HCL 5 MG/ML IV SOLN: 10.0000 mg | INTRAVENOUS | Status: DC | PRN

## 2020-04-06 MED ORDER — POTASSIUM CHLORIDE 10 MEQ/100ML IV SOLN
10.0000 meq | INTRAVENOUS | Status: AC
  Administered 2020-04-06 (×7): 10 meq via INTRAVENOUS
  Filled 2020-04-06 (×7): qty 100

## 2020-04-06 MED ORDER — SODIUM CHLORIDE 0.9% FLUSH
10.0000 mL | INTRAVENOUS | Status: DC | PRN
Start: 2020-04-06 — End: 2020-04-19

## 2020-04-06 NOTE — Progress Notes (Signed)
RN notified PICC ready to use and aware of need to remove all PIV's.

## 2020-04-06 NOTE — Progress Notes (Signed)
SLP Cancellation Note  Patient Details Name: Gregory Osborn MRN: [id number] DOB: 07/17/1945   Cancelled treatment:     Pt with clinical worsening and is now NPO.  Will f/u next date for readiness to resume dysphagia 1 diet vs necessity of cortrak.  [name] L. Samson Frederic, MA CCC/SLP Acute Rehabilitation Services Office number [phone] Pager [phone]       Carolan Shiver 04/06/2020, 9:59 AM

## 2020-04-06 NOTE — Progress Notes (Signed)
Spoke with Rn re PICC order.  States family is in family conference at this time.  Will notify PICC team when available.

## 2020-04-06 NOTE — Progress Notes (Signed)
NAME:  Gregory Osborn, MRN:  [id number], DOB:  1945-10-20, LOS: 4 ADMISSION DATE:  04/02/2020, CONSULTATION DATE:  04/03/2020 REFERRING MD:  Pearlean Brownie, CHIEF COMPLAINT:  R M1 occlusion   Brief History:  75 year old male who presented to [facility] with L hemiplegia, L facial droop, slurred speech after fall. 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 TPA not given due to hematuria history. Emergently transferred to [facility] IR for thrombectomy (unsuccessful).  History of Present Illness:  Gregory Osborn is a 75 year old male with history of HTN (recently started on lisinopril), BPH, urinary retention with frequent UTIs (recently utilizing an indwelling Foley catheter at home, followed by Urology) and hematuria (likely 2/2 traumatic catheterization). Per family he was doing well overall 2/16 until 1125 when he was reportedly had a fall at home. Family found him with new onset of L-sided paralysis, L facial droop and slurred speech and called EMS. He was taken to [facility] for evaluation and diagnosed with R M1 occlusion. No TPA was administered, given patient's recent report of hematuria. He was emergently transferred to [facility] for IR thrombectomy/recanalization.   Per IR procedure note, temporary recanalization was achieved multiple times but resulted in subsequent reocclusion (8 pass attempts made). Post-procedure, patient was transferred to 4N. PCCM was consulted 2/17 in the setting of expected post-stroke edema for possible need for reintubation and ventilatory support.  Today, patient reports ongoing LUE/LLE weakness and chest discomfort (reproducible). Denies recent fever/chills, SOB/dyspnea, n/v/d, sick contacts. He has had some urinary symptoms (hesitancy, hematuria) as noted above for which he is well-established with urology.  Past Medical History:  HTN, urinary retention with frequent UTIs, BPH, hematuria  Significant Hospital Events:  2/16 Presented to [facility] with L hemiplegia/facial droop, LKW 2/16 1125. Dx with  R M1 occlusion, transferred to [facility], IR attempted thrombectomy (unsuccessful)  Consults:  PCCM  Procedures:  ETT 2/16  Significant Diagnostic Tests:   CT Head 2/16 >> complete R MCA territory infarction with at least partial involvement of the basal ganglia, areas of hyperdensity in the deep insula/frontoparietal junction could represent a combination of contrast staining and small amount of hemorrhage, no large confluent hematoma, mild swelling with early mass effect, R to L shift of 55mm  IR Percutaneous Art Thrombectomy 2/16  Micro Data:  2/16 COVID >> negative  Antimicrobials:  N/A  Interim History / Subjective:  Acute worsening yesterday. Patient  stuporous.  CT head showed increasing midline shift.  Sodium goal increased and patient was given 23% saline. More responsive this morning. Able to mumble appropriately and follow commands for RN  Objective   Blood pressure (!) 160/56, pulse (!) 107, temperature (!) 97.3 F (36.3 C), temperature source Axillary, resp. rate 16, height 5\' 5"  (1.651 m), weight 76.3 kg, SpO2 99 %.        Intake/Output Summary (Last 24 hours) at 04/06/2020 0851 Last data filed at 04/06/2020 0700 Gross per 24 hour  Intake 1831.66 ml  Output 3800 ml  Net -1968.34 ml   Filed Weights   04/02/20 1519 04/02/20 1730  Weight: 77.1 kg 76.3 kg   Examination: General:  adult male, laying in bed, in NAD. HEENT: Anicteric sclera,. Moist mucous membranes. L-sided facial droop with rightward gaze preference. Neuro: obtunded, moves right side spontaneously, mumbles but speech appropriate CV: S1S2, RRR, no m/g/r. Extremities warm  PULM: chest clear, no upper airway transmitted noises.  GI: Soft, nontender, nondistended. Extremities: No LE edema noted.  Skin: Warm/dry, no rashes.  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  Urinary retention complicated by frequent UTI Hematuria BPH with chronic urinary retention Hypertension  Plan:  -Continue  3% hypertonic saline at current rate, start weaning in next 24-48h based on improving examination.  -Prognosis is poor. Patient has been declined for hemicraniectomy - will redirect goals of care as no exit strategy if worsens again - PRN anti-hypertensive agents increased. BP will likely improve once hypertonic saline decreased.    Daily Goals Checklist  Pain/Anxiety/Delirium protocol (if indicated): none Neuro vitals: every 1 hours AED's: valproate VAP protocol (if indicated): not intubated Respiratory support goals: aspiration precautions.  Blood pressure target: SBP 120-160. Adding prn labetalol DVT prophylaxis: heparin tid Nutrition Status: on hold given mental status. Cortrak tomorrow if mental status not improved.  GI prophylaxis: not indicated.  Fluid status goals: allowing autoregulation Urinary catheter: external catheter.  Central lines: PIV only Glucose control: euglycemic on no treatment Mobility/therapy needs: bedrest Antibiotic de-escalation: none Home medication reconciliation: on hold Daily labs: sodium q6h while on hypertonic saline.  Code Status: full code  Family Communication: updated yesterday by Dr Roda Shutters Disposition: ICU.   Goals of Care:  Last date of multidisciplinary goals of care discussion: With Neurology 2/19 Family and staff present: son and wife Summary of discussion: per Neurology Follow up goals of care discussion due: 2/23 Code Status: Full  Labs   CBC: Recent Labs  Lab 04/02/20 2208 04/05/20 0304 04/06/20 0352  WBC 14.8* 14.2* 10.3  NEUTROABS 14.0*  --   --   HGB 14.9 13.7 14.5  HCT 42.4 39.8 43.6  MCV 84.8 85.6 86.5  PLT 244 211 243    Basic Metabolic Panel: Recent Labs  Lab 04/02/20 2208 04/03/20 1005 04/03/20 1652  04/04/20 0233 04/04/20 0957 04/05/20 0304 04/05/20 0932 04/05/20 1519 04/05/20 2139 04/06/20 0352  NA 139 141   < > 145   < > 148* 149* 150* 155* 157*  K 3.7 3.6  --  3.3*  --  3.2*  --   --   --  2.4*  CL 106 108  --  113*  --  115*  --   --   --  124*  CO2 21* 23  --  24  --  21*  --   --   --  21*  GLUCOSE 182* 125*  --  123*  --  106*  --   --   --  129*  BUN 7* 8  --  9  --  8  --   --   --  14  CREATININE 0.90 0.93  --  0.88  --  0.79  --   --   --  0.77  CALCIUM 8.5* 8.8*  --  8.6*  --  9.1  --   --   --  9.4   < > = values in this interval not displayed.   GFR: Estimated Creatinine Clearance: 79.6 mL/min (by C-G formula based on SCr of 0.77 mg/dL). Recent Labs  Lab 04/02/20 2208 04/05/20 0304 04/06/20 0352  WBC 14.8* 14.2* 10.3    Liver Function Tests: Recent Labs  Lab 04/02/20 2208  AST 29  ALT 18  ALKPHOS 86  BILITOT 0.8  PROT 6.5  ALBUMIN 3.4*   No results for input(s): LIPASE, AMYLASE in the last 168 hours. No results for  input(s): AMMONIA in the last 168 hours.  ABG No results found for: PHART, PCO2ART, PO2ART, HCO3, TCO2, ACIDBASEDEF, O2SAT   Coagulation Profile: Recent Labs  Lab 04/02/20 2208  INR 1.0    Cardiac Enzymes: No results for input(s): CKTOTAL, CKMB, CKMBINDEX, TROPONINI in the last 168 hours.  HbA1C: Hgb A1c MFr Bld  Date/Time Value Ref Range Status  04/03/2020 10:05 AM 5.6 4.8 - 5.6 % Final    Comment:    (NOTE) Pre diabetes:          5.7%-6.4%  Diabetes:              >6.4%  Glycemic control for   <7.0% adults with diabetes     CBG: Recent Labs  Lab 04/02/20 2006 04/05/20 1630  GLUCAP 176* 92    CRITICAL CARE Performed by: Lynnell Catalan   Total critical care time: 35 minutes  Critical care time was exclusive of separately billable procedures and treating other patients.  Critical care was necessary to treat or prevent imminent or life-threatening deterioration.  Critical care was time spent personally  by me on the following activities: development of treatment plan with patient and/or surrogate as well as nursing, discussions with consultants, evaluation of patient's response to treatment, examination of patient, obtaining history from patient or surrogate, ordering and performing treatments and interventions, ordering and review of laboratory studies, ordering and review of radiographic studies, pulse oximetry, re-evaluation of patient's condition and participation in multidisciplinary rounds.  Lynnell Catalan, MD [facility] ICU Physician [facility] [place] [facility]  Pager: [phone] Mobile: [phone] After hours: [phone].

## 2020-04-06 NOTE — Progress Notes (Signed)
Pt AM K+ 2.4 (critical) with creat 0.77 and GFR > 60. ELink CCM electrolyte protocol initiated. BMET to be repeated after all doses in.

## 2020-04-06 NOTE — Progress Notes (Addendum)
STROKE TEAM PROGRESS NOTE   INTERVAL HISTORY Severe hypertension overnight. On maximum dose of cleviprex infusion. Hypokalemic overnight to 2.4. Receiving K runs per CCM.  Patient remains somnolent but more responsive overall this morning with brisk response to name calling. Keeps eyes closed but able to state name. Spontaneously moving bilat UE.   Vitals:   04/06/20 0630 04/06/20 0645 04/06/20 0700 04/06/20 0800  BP: (!) 168/55 (!) 164/51 (!) 160/56   Pulse: (!) 108 (!) 113 (!) 107   Resp: (!) 21 (!) 23 16   Temp:    (!) 97.3 F (36.3 C)  TempSrc:    Axillary  SpO2:      Weight:      Height:       CBC:  Recent Labs  Lab 04/02/20 2208 04/05/20 0304 04/06/20 0352  WBC 14.8* 14.2* 10.3  NEUTROABS 14.0*  --   --   HGB 14.9 13.7 14.5  HCT 42.4 39.8 43.6  MCV 84.8 85.6 86.5  PLT 244 211 243   Basic Metabolic Panel:  Recent Labs  Lab 04/05/20 0304 04/05/20 0932 04/05/20 2139 04/06/20 0352  NA 148*   < > 155* 157*  K 3.2*  --   --  2.4*  CL 115*  --   --  124*  CO2 21*  --   --  21*  GLUCOSE 106*  --   --  129*  BUN 8  --   --  14  CREATININE 0.79  --   --  0.77  CALCIUM 9.1  --   --  9.4   < > = values in this interval not displayed.   Lipid Panel:  Recent Labs  Lab 04/03/20 1005  CHOL 197  TRIG 109  HDL 57  CHOLHDL 3.5  VLDL 22  LDLCALC 681*   HgbA1c:  Recent Labs  Lab 04/03/20 1005  HGBA1C 5.6   Urine Drug Screen:  Recent Labs  Lab 04/02/20 1809  LABOPIA NONE DETECTED  COCAINSCRNUR NONE DETECTED  LABBENZ NONE DETECTED  AMPHETMU NONE DETECTED  THCU NONE DETECTED  LABBARB NONE DETECTED    Alcohol Level  Recent Labs  Lab 04/02/20 2208  ETH <10    IMAGING past 24 hours No results found. PHYSICAL EXAM  Temp:  [97.3 F (36.3 C)-98.7 F (37.1 C)] 97.3 F (36.3 C) (02/20 0800) Pulse Rate:  [45-113] 107 (02/20 0700) Resp:  [11-23] 16 (02/20 0700) BP: (135-230)/(46-170) 160/56 (02/20 0700) SpO2:  [95 %-99 %] 99 % (02/19 1700)  General  - Well nourished, well developed elderly Hispanic male, in no apparent distress.   Ophthalmologic - fundi not visualized due to noncooperation.  Mental Status -  Somnolent. Keeps eyes closed at all times. Does not open to command. Responds to name calling with "Si" and brief eye opening. Able to state his name with repeated requests and tactile stimulation. Does not respond to most questions or conversation.  Cranial Nerves II - XII - II -  PERRL 64mm  III, IV, VI - Eyes appear to be in the midline V - Unable to assess facial sensation  VII -mild left lower facial weakness.   VIII - No response to name calling. UTA.  XII - UTA   Motor Strength -  Spontaneously moving RUE and RLE this morning with tactile stimulation. Withdraws LLE.   Sensory - Light touch, temperature/pinprick were assessed and were symmetrical     Coordination - left plegia   Gait and Station - deferred.  ASSESSMENT/PLAN Gregory Osborn is a 75 y.o. male with a history of recent urologic issues including UTI (01/22), urinary retention with foley cath placed, hematuria (likely traumatic due to self cath) s/p cystoscopy 04/01/20 by Dr. Pete Glatter [facility]  showing BPH with recommended surgical treatment. Surgical history includes appendectomy.He had been doing well up until 11:25am when he was noted to develop left hemiplegia. EMS was contacted, and took him to the [facility] ED where stroke workup was undertaken. He was noted to have left hemiplegia and neglect. NIHSS score 15.  He was not given tPA due to the hematuria. Transferred urgently to [facility] for thrombectomy which was aborted after multiple attempts which were unsuccessful.   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    MRI showed large right MCA  infarct involving entire right MCA, consistent with malignant right MCA syndrome.  Small right SAH likely due to hemorrhagic conversion.    MRA head showed right M1 occluded.   2D Echo EF 60-65%, No shunt   LDL 118  HgbA1c 5.6  VTE prophylaxis - heparin subq  No antithrombotics PTA, now on ASA 300 PR mg daily as no po access  Therapy recommendations:  CIR  Disposition:  TBD  Had family meeting today, further Goals of Care is still pending   Cerebral Edema  Critical care medicine team consulted.  Repeat HCT 2/19 shows worsening shift and edema, MLS at 19mm  3% saline with 2 PIVs at 150cc/hr -> 23.4% 30cc with 3% saline @ 75   Na goal 155-160  Na check Q6h  Neurosurgery, Dr. Jake Samples, consulted and advised patient is not a candidate for surgical intervention at this time.   HA overnight - Depakote po started yesterday for headache management.    Hypertensive urgency  Home meds:  Lisinopril 5mg  daily   Maximum cleviprex infusion at present due to severe hypertension overnight.  . BP goal < 160 given hemorrhagic conversion . Long-term BP goal normotensive  HLD   no statin prior to admission  LDL 118, goal < 70  Consider statin once po access  Dysphagia   NPO after mental decline  On IVF  Speech on board  Consider cortrak if not able to swallow in am   Other Stroke Risk Factors  ETOH use, alcohol level <10, family reports he drinks regularly but not a problem.   Urinary retention d/t BPH present on admission  Developed urinary retention and UTI about a month ago necessitating trial of intermittent self cath (failed) and then subsequent foley placement  S/p cystoscopy 2/15 with recommended surgical intervention for BPH by Dr. 3/15 [facility])  Hematuria appears transient after attempts at self cath per urology notes review. Hematuria not present on on UA yesterday.   Continue foley catheter, Monitor for hematuria. No signs of active bleeding. hgb  stable. Foley output remains clear yellow.   Other Active Problems    Hospital day # 4  Delila A Bailey-Modzik, NP-C   ATTENDING NOTE: I reviewed above note and agree with the assessment and plan. Pt was seen and examined.   Pt this am more awake alert and interactive. His eyes open, and able to tell me his age, current place, son's name but not orientated to time. Severe dysarthria. Not cooperative with name or repeat. Still has  right gaze preference and not able to cross midline. Left hemianopia vs. Visual neglect. Left facial droop. Left UE flaccid. Left LE withdraw barely against gravity on pain stimulation. Right UE and LE spontaneous movement.   Pt clinical condition improving. Na 157 this am. Will continue 3% saline with Na goal 155-160. K was low, will supplement. Will put in PICC line. Speech on board but NPO today and reassess in am. Continue ASA PR for now. cleviprex for BP control. Once po access, will add po BP meds and TF.   Had family meeting today with 10 family members in conference room and 2 family members on the phone. I had long conversion with family, updated pt current condition, treatment plan and potential prognosis, and answered all the questions. They expressed understanding and appreciation. Told family that pt is still in critical condition with possible further cerebral edema or hemorrhagic conversion or other complications could make pt decline. They are still discussing about DNR or further plan.   Detailed assessment and plan please refer to above where I have made changes when necessary.   Marvel Plan, MD PhD Stroke Neurology 04/06/2020 5:08 PM  This patient is critically ill due to right malignant MCA syndrome, brain herniation, hypertensive emergency, dysphagia and at significant risk of neurological worsening, death form cerebral edema, hemorrhagic conversion, seizure, hypertensive encephalopathy, aspiration. This patient's care requires constant  monitoring of vital signs, hemodynamics, respiratory and cardiac monitoring, review of multiple databases, neurological assessment, discussion with family, other specialists and medical decision making of high complexity. I spent 70 minutes of neurocritical care time in the care of this patient as well as talking with family.   To contact Stroke Continuity provider, please refer to [email or web address]. After hours, contact General Neurology

## 2020-04-06 NOTE — Progress Notes (Signed)
Notified of critical potasium of 2.4. Perrin notified of K=2.4. See new orders.

## 2020-04-06 NOTE — Progress Notes (Signed)
Peripherally Inserted Central Catheter Placement  The IV Nurse has discussed with the patient and/or persons authorized to consent for the patient, the purpose of this procedure and the potential benefits and risks involved with this procedure.  The benefits include less needle sticks, lab draws from the catheter, and the patient may be discharged home with the catheter. Risks include, but not limited to, infection, bleeding, blood clot (thrombus formation), and puncture of an artery; nerve damage and irregular heartbeat and possibility to perform a PICC exchange if needed/ordered by physician.  Alternatives to this procedure were also discussed.  Bard Power PICC patient education guide, fact sheet on infection prevention and patient information card has been provided to patient /or left at bedside.    PICC Placement Documentation  PICC Triple Lumen 04/06/20 PICC Right Basilic 40 cm 0 cm (Active)  Indication for Insertion or Continuance of Line Vasoactive infusions 04/06/20 1336  Exposed Catheter (cm) 0 cm 04/06/20 1336  Site Assessment Clean;Intact;Dry 04/06/20 1336  Lumen #1 Status Flushed;Blood return noted;Saline locked 04/06/20 1336  Lumen #2 Status Flushed;Blood return noted;Saline locked 04/06/20 1336  Lumen #3 Status Flushed;Blood return noted;Saline locked 04/06/20 1336  Dressing Type Transparent 04/06/20 1336  Dressing Status Clean;Intact;Dry 04/06/20 1336  Antimicrobial disc in place? Yes 04/06/20 1336  Dressing Change Due 04/13/20 04/06/20 1336       Gregory Osborn 04/06/2020, 1:39 PM

## 2020-04-06 NOTE — Progress Notes (Signed)
PT Cancellation Note  Patient Details Name: [name] MRN: [id number] DOB: [birth year]   Cancelled Treatment:    Reason Eval/Treat Not Completed: Patient not medically ready. PT discussed pt with Dr. Roda Shutters, who is requesting to hold PT for now as pt is clinically worsening. PT will continue to check in on pt while admitted and evaluate if medically appropriate.    Alessandra Bevels Caleigha Zale 04/06/2020, 9:53 AM

## 2020-04-07 DIAGNOSIS — I63511 Cerebral infarction due to unspecified occlusion or stenosis of right middle cerebral artery: Secondary | ICD-10-CM | POA: Diagnosis not present

## 2020-04-07 DIAGNOSIS — E876 Hypokalemia: Secondary | ICD-10-CM

## 2020-04-07 DIAGNOSIS — I63011 Cerebral infarction due to thrombosis of right vertebral artery: Secondary | ICD-10-CM | POA: Diagnosis not present

## 2020-04-07 DIAGNOSIS — G936 Cerebral edema: Secondary | ICD-10-CM | POA: Diagnosis not present

## 2020-04-07 DIAGNOSIS — R1312 Dysphagia, oropharyngeal phase: Secondary | ICD-10-CM | POA: Diagnosis not present

## 2020-04-07 LAB — BASIC METABOLIC PANEL
Anion gap: 16 — ABNORMAL HIGH (ref 5–15)
Anion gap: 9 (ref 5–15)
Anion gap: 12 (ref 5–15)
BUN: 12 mg/dL (ref 8–23)
BUN: 14 mg/dL (ref 8–23)
BUN: 16 mg/dL (ref 8–23)
CO2: 22 mmol/L (ref 22–32)
CO2: 23 mmol/L (ref 22–32)
CO2: 24 mmol/L (ref 22–32)
Calcium: 10 mg/dL (ref 8.9–10.3)
Calcium: 9.2 mg/dL (ref 8.9–10.3)
Calcium: 9.5 mg/dL (ref 8.9–10.3)
Chloride: 122 mmol/L — ABNORMAL HIGH (ref 98–111)
Chloride: 124 mmol/L — ABNORMAL HIGH (ref 98–111)
Chloride: 120 mmol/L — ABNORMAL HIGH (ref 98–111)
Creatinine, Ser: 0.76 mg/dL (ref 0.61–1.24)
Creatinine, Ser: 0.84 mg/dL (ref 0.61–1.24)
Creatinine, Ser: 0.91 mg/dL (ref 0.61–1.24)
GFR, Estimated: >60 mL/min (ref >60)
GFR, Estimated: >60 mL/min (ref >60)
GFR, Estimated: >60 mL/min (ref >60)
Glucose, Bld: 115 mg/dL — ABNORMAL HIGH (ref 70–99)
Glucose, Bld: 107 mg/dL — ABNORMAL HIGH (ref 70–99)
Glucose, Bld: 114 mg/dL — ABNORMAL HIGH (ref 70–99)
Potassium: 2.6 mmol/L — CL (ref 3.5–5.1)
Potassium: 5.2 mmol/L — ABNORMAL HIGH (ref 3.5–5.1)
Potassium: 3 mmol/L — ABNORMAL LOW (ref 3.5–5.1)
Sodium: 160 mmol/L — ABNORMAL HIGH (ref 135–145)
Sodium: 156 mmol/L — ABNORMAL HIGH (ref 135–145)
Sodium: 156 mmol/L — ABNORMAL HIGH (ref 135–145)

## 2020-04-07 LAB — CBC
HCT: 45.1 % (ref 39.0–52.0)
Hemoglobin: 14.8 g/dL (ref 13.0–17.0)
MCH: 28.7 pg (ref 26.0–34.0)
MCHC: 32.8 g/dL (ref 30.0–36.0)
MCV: 87.4 fL (ref 80.0–100.0)
Platelets: 256 10*3/uL (ref 150–400)
RBC: 5.16 MIL/uL (ref 4.22–5.81)
RDW: 13.2 % (ref 11.5–15.5)
WBC: 9.3 10*3/uL (ref 4.0–10.5)
nRBC: 0 % (ref 0.0–0.2)

## 2020-04-07 LAB — MAGNESIUM: Magnesium: 2.3 mg/dL (ref 1.7–2.4)

## 2020-04-07 LAB — SODIUM
Sodium: 158 mmol/L — ABNORMAL HIGH (ref 135–145)
Sodium: 158 mmol/L — ABNORMAL HIGH (ref 135–145)

## 2020-04-07 MED ORDER — OSMOLITE 1.2 CAL PO LIQD
1000.0000 mL | ORAL | Status: DC
  Administered 2020-04-08 – 2020-04-13 (×7): 1000 mL
  Filled 2020-04-07 (×8): qty 1000

## 2020-04-07 MED ORDER — ATORVASTATIN CALCIUM 40 MG PO TABS
40.0000 mg | ORAL_TABLET | Freq: Every day | ORAL | Status: DC
  Administered 2020-04-08 – 2020-04-25 (×18): 40 mg
  Filled 2020-04-07 (×19): qty 1

## 2020-04-07 MED ORDER — AMLODIPINE BESYLATE 10 MG PO TABS: 10.0000 mg | ORAL_TABLET | Freq: Every day | ORAL | Status: DC

## 2020-04-07 MED ORDER — POTASSIUM CHLORIDE 10 MEQ/100ML IV SOLN
10.0000 meq | INTRAVENOUS | Status: AC
  Administered 2020-04-07 (×6): 10 meq via INTRAVENOUS
  Filled 2020-04-07 (×6): qty 100

## 2020-04-07 MED ORDER — POTASSIUM CHLORIDE 20 MEQ PO PACK
40.0000 meq | PACK | Freq: Once | ORAL | Status: AC
  Administered 2020-04-07: 40 meq
  Filled 2020-04-07: qty 2

## 2020-04-07 MED ORDER — SODIUM CHLORIDE 0.9 % IV SOLN: INTRAVENOUS | Status: DC

## 2020-04-07 MED ORDER — AMLODIPINE BESYLATE 10 MG PO TABS
10.0000 mg | ORAL_TABLET | Freq: Every day | ORAL | Status: DC
  Administered 2020-04-07 – 2020-04-19 (×13): 10 mg
  Filled 2020-04-07 (×13): qty 1

## 2020-04-07 MED ORDER — PROSOURCE TF PO LIQD
45.0000 mL | Freq: Every day | ORAL | Status: DC
  Administered 2020-04-07 – 2020-04-25 (×19): 45 mL
  Filled 2020-04-07 (×18): qty 45

## 2020-04-07 MED ORDER — LISINOPRIL 20 MG PO TABS: 20.0000 mg | ORAL_TABLET | Freq: Every day | ORAL | Status: DC

## 2020-04-07 MED ORDER — DOCUSATE SODIUM 50 MG/5ML PO LIQD
100.0000 mg | Freq: Two times a day (BID) | ORAL | Status: DC
  Administered 2020-04-07 – 2020-04-08 (×3): 100 mg
  Filled 2020-04-07 (×3): qty 10

## 2020-04-07 MED ORDER — LISINOPRIL 20 MG PO TABS
20.0000 mg | ORAL_TABLET | Freq: Every day | ORAL | Status: DC
  Administered 2020-04-07 – 2020-04-08 (×2): 20 mg
  Filled 2020-04-07 (×2): qty 1

## 2020-04-07 MED ORDER — ASPIRIN 325 MG PO TABS
325.0000 mg | ORAL_TABLET | Freq: Every day | ORAL | Status: DC
  Administered 2020-04-08 – 2020-04-25 (×18): 325 mg
  Filled 2020-04-07 (×18): qty 1

## 2020-04-07 MED ORDER — WHITE PETROLATUM EX OINT
TOPICAL_OINTMENT | CUTANEOUS | Status: AC
  Administered 2020-04-07: 1
  Filled 2020-04-07: qty 28.35

## 2020-04-07 MED ORDER — ASPIRIN 325 MG PO TABS
325.0000 mg | ORAL_TABLET | Freq: Every day | ORAL | Status: DC
Start: 2020-04-08 — End: 2020-04-07

## 2020-04-07 MED ORDER — SODIUM CHLORIDE 0.9 % IV SOLN: INTRAVENOUS | Status: DC | PRN

## 2020-04-07 NOTE — Progress Notes (Signed)
Attempted video chat with daughters x 10 minutes with no response

## 2020-04-07 NOTE — Evaluation (Addendum)
Occupational Therapy Evaluation Patient Details Name: Gregory Osborn MRN: [id number] DOB: May 09, 1945 Today's Date: 04/07/2020    History of Present Illness 75 yo male admmitted to [facility] ED on 2/16 with L hemiplegia and facial drop, imaging reveals R M1 occlusion with attempted thrombectomy (unsuccessful). Small R SAH, likely due to hemorrhagic conversion. ETT 2/16-present. PMH includes HTN, urinary retention with frequent UTIs, BPH, hematuria.   Clinical Impression   Pt unable to provide home set up and PLOF due to cognitive and communication deficits. Pt with very supportive family that were present early today; not present for eval. Pt currently requiring Max-Total A for ADLs and bed mobility. Pt presenting with R gaze, L sided weakness, poor balance, decreased cognition, and poor activity tolerance. Pt would benefit from further acute OT to facilitate safe dc. Recommend dc to SNF for further OT to optimize safety, independence with ADLs, and return to PLOF. In person interpreter used throughout.    Follow Up Recommendations  SNF    Equipment Recommendations  Other (comment) (Defer to next venue)    Recommendations for Other Services PT consult;Speech consult     Precautions / Restrictions Precautions Precautions: Fall Restrictions Weight Bearing Restrictions: No      Mobility Bed Mobility Overal bed mobility: Needs Assistance Bed Mobility: Supine to Sit;Sit to Sidelying     Supine to sit: Total assist;+2 for physical assistance;+2 for safety/equipment   Sit to sidelying: Total assist;+2 for physical assistance;+2 for safety/equipment General bed mobility comments: total +2 for all aspects, including trunk and LE management, boost up in bed upon return to supine, and hip placement at EOB with assist of bed pads.    Transfers                 General transfer comment: nt    Balance Overall balance assessment: Needs assistance Sitting-balance support: No upper  extremity supported;Feet supported Sitting balance-Leahy Scale: Zero Sitting balance - Comments: mod-max posterior assist to maintain upright sitting, strong pushing with RUE towards L requiring frequent placement of RUE in pt lap. Postural control: Posterior lean                                 ADL either performed or assessed with clinical judgement   ADL Overall ADL's : Needs assistance/impaired     Grooming: Wash/dry face;Maximal assistance;Sitting;Minimal assistance Grooming Details (indicate cue type and reason): Max A for sitting balance. Min A for washing face. Cues for sequencing.                             Functional mobility during ADLs: Total assistance;+2 for physical assistance;+2 for safety/equipment General ADL Comments: Max-Total A for ADLs and bed mobility.     Vision   Vision Assessment?: Yes;Vision impaired- to be further tested in functional context Additional Comments: Need to continue to assess. Difficult due to poor following of commands. R head turn and gaze. Neglect/inattention to R. Poor tracking and difficulty tracking to midline     Perception     Praxis      Pertinent Vitals/Pain Pain Assessment: Faces Faces Pain Scale: Hurts a little bit Pain Location: head (dizzy) Pain Descriptors / Indicators: Discomfort Pain Intervention(s): Limited activity within patient's tolerance;Monitored during session;Repositioned     Hand Dominance Left   Extremity/Trunk Assessment Upper Extremity Assessment Upper Extremity Assessment: LUE deficits/detail LUE Deficits / Details: Flaccid LUE.  No responce to painful stimuli. Sublux 1 finger width. Increased edema LUE Sensation: decreased light touch;decreased proprioception LUE Coordination: decreased fine motor;decreased gross motor   Lower Extremity Assessment Lower Extremity Assessment: Defer to PT evaluation LLE Deficits / Details: no active movement any muscle group, flaccidity  throughout.   Cervical / Trunk Assessment Cervical / Trunk Assessment: Kyphotic   Communication Communication Communication: Expressive difficulties (Inperson spanish interpreter used)   Cognition Arousal/Alertness: Awake/alert Behavior During Therapy: WFL for tasks assessed/performed Overall Cognitive Status: No family/caregiver present to determine baseline cognitive functioning Area of Impairment: Attention;Following commands;Safety/judgement;Problem solving;Awareness                   Current Attention Level: Focused   Following Commands: Follows one step commands inconsistently Safety/Judgement: Decreased awareness of deficits;Decreased awareness of safety Awareness: Intellectual Problem Solving: Slow processing;Decreased initiation;Difficulty sequencing;Requires tactile cues;Requires verbal cues General Comments: pt states he is in the hospital because he "fainted", PT oriented pt to CVA. Pt with strong R gaze preference, able to reach midline with multimodal cuing. RUE pushing towards L, corrected with placing pt's hand in his lap. Pt mostly responds "yes" to all questions during session   General Comments  VSS    Exercises     Shoulder Instructions      Home Living Family/patient expects to be discharged to:: Private residence Living Arrangements: Spouse/significant other Available Help at Discharge: Family                                    Prior Functioning/Environment          Comments: unsure, family not present on eval        OT Problem List: Decreased strength;Decreased range of motion;Decreased activity tolerance;Impaired balance (sitting and/or standing);Impaired vision/perception;Decreased coordination;Decreased cognition;Decreased safety awareness;Decreased knowledge of use of DME or AE;Decreased knowledge of precautions;Impaired UE functional use      OT Treatment/Interventions: Self-care/ADL training;Therapeutic exercise;Energy  conservation;DME and/or AE instruction;Therapeutic activities;Patient/family education    OT Goals(Current goals can be found in the care plan section) Acute Rehab OT Goals Patient Stated Goal: none stated OT Goal Formulation: Patient unable to participate in goal setting Time For Goal Achievement: 04/21/20 Potential to Achieve Goals: Fair  OT Frequency: Min 2X/week   Barriers to D/C:            Co-evaluation PT/OT/SLP Co-Evaluation/Treatment: Yes Reason for Co-Treatment: For patient/therapist safety;To address functional/ADL transfers PT goals addressed during session: Mobility/safety with mobility;Balance OT goals addressed during session: ADL's and self-care      AM-PAC OT "6 Clicks" Daily Activity     Outcome Measure Help from another person eating meals?: Total Help from another person taking care of personal grooming?: A Lot Help from another person toileting, which includes using toliet, bedpan, or urinal?: Total Help from another person bathing (including washing, rinsing, drying)?: Total Help from another person to put on and taking off regular upper body clothing?: A Lot Help from another person to put on and taking off regular lower body clothing?: Total 6 Click Score: 8   End of Session Nurse Communication: Mobility status  Activity Tolerance: Patient tolerated treatment well Patient left: in chair;with call bell/phone within reach;with chair alarm set  OT Visit Diagnosis: Unsteadiness on feet (R26.81);Other abnormalities of gait and mobility (R26.89);Muscle weakness (generalized) (M62.81);Pain;Hemiplegia and hemiparesis;Low vision, both eyes (H54.2);Other symptoms and signs involving cognitive function Hemiplegia - Right/Left: Right Hemiplegia - caused  by: Cerebral infarction                Time: 2637-8588 OT Time Calculation (min): 30 min Charges:  OT General Charges $OT Visit: 1 Visit OT Evaluation $OT Eval Moderate Complexity: 1 Mod  Laksh Hinners  MSOT, OTR/L Acute Rehab Pager: [phone] Office: [phone]  Theodoro Grist Orvella Digiulio 04/07/2020, 5:01 PM

## 2020-04-07 NOTE — Progress Notes (Signed)
Initial Nutrition Assessment  DOCUMENTATION CODES:   Not applicable  INTERVENTION:   Once Cortrak placed, initiate tube feeds: - Start Osmolite 1.2 @ 25 ml/hr and advance by 10 ml q 4 hours to goal rate of 65 ml/hr (1560 ml/day) - ProSource TF 45 ml daily  Tube feeding regimen at goal provides 1912 kcal, 98 grams of protein, and 1279 ml of H2O.   NUTRITION DIAGNOSIS:   Inadequate oral intake related to lethargy/confusion,dysphagia as evidenced by NPO status.  GOAL:   Patient will meet greater than or equal to 90% of their needs  MONITOR:   Diet advancement,Labs,Weight trends,TF tolerance,I & O's  REASON FOR ASSESSMENT:   Consult Enteral/tube feeding initiation and management  ASSESSMENT:   75 year old male who presented on 2/16 with stroke with left hemiplegia. PMH of UTI, urinary retention, hematuria, and appendectomy.   2/16 - s/p diagnostic cerebral angiogram and unsuccessful mechanical thrombectomy attempt in IR 2/18 - MBS with recommendations for dysphagia 1 diet with thin liquids  Per notes, pt with cerebral edema and AMS. Hypertonic saline stopped overnight due to rapid rise in sodium levels. Per notes, pt is not a candidate for any neurosurgery interventions.  Spoke with pt's family, RN, and SLP at bedside. Plan is for Cortrak placement today and initiation of enteral nutrition. SLP in agreement with plan and pt to remain NPO except for purees (applesauce, pudding) from floor stock. Pt's family expresses understanding and desire for Cortrak placement to give pt strength to continue to heal.  Pt's family provided diet and weight history. Per pt's family, pt typically has an excellent appetite, eats well, and is very active. Pt's family reports that pt has not lost any weight recently. Pt's last meal was Monday or Tuesday of last week. Pt has not eaten at all since stroke on Wednesday per family. Given pt has had little to no nutrition x 5-6 days, RD will order  fiber-free tube feeding formula and slowly advance to goal rate.  Medications reviewed and include: NS @ 50 ml/hr, cleviprex @ 16 ml/hr (provides 768 kcal daily from lipid), IV KCl 10 mEq x 6, depacon  Labs reviewed: sodium 160, potassium 2.6  UOP: 3350 ml x 24 hours I/O's: -4.6 L since admit  NUTRITION - FOCUSED PHYSICAL EXAM:  Flowsheet Row Most Recent Value  Orbital Region No depletion  Upper Arm Region No depletion  Thoracic and Lumbar Region No depletion  Buccal Region No depletion  Temple Region No depletion  Clavicle Bone Region Mild depletion  Clavicle and Acromion Bone Region Mild depletion  Scapular Bone Region Unable to assess  Dorsal Hand No depletion  Patellar Region No depletion  Anterior Thigh Region Mild depletion  Posterior Calf Region Moderate depletion  Edema (RD Assessment) None  Hair Reviewed  Eyes Unable to assess  Mouth Unable to assess  Skin Reviewed  Nails Reviewed       Diet Order:   Diet Order            Diet NPO time specified  Diet effective now                 EDUCATION NEEDS:   No education needs have been identified at this time  Skin:  Skin Assessment: Skin Integrity Issues: Incisions: right groin  Last BM:  no documented BM  Height:   Ht Readings from Last 1 Encounters:  04/02/20 5\' 5"  (1.651 m)    Weight:   Wt Readings from Last 1 Encounters:  04/02/20  76.3 kg    BMI:  Body mass index is 27.99 kg/m.  Estimated Nutritional Needs:   Kcal:  1800-2000  Protein:  85-100 grams  Fluid:  1.8-2.0 L    Mertie Clause, MS, RD, LDN Inpatient Clinical Dietitian Please see AMiON for contact information.

## 2020-04-07 NOTE — Progress Notes (Signed)
  Speech Language Pathology Treatment: Dysphagia  Patient Details Name: Gregory Osborn MRN: [id number] DOB: 1945/07/12 Today's Date: 04/07/2020 Time: 1320-1350 SLP Time Calculation (min) (ACUTE ONLY): 30 min  Assessment / Plan / Recommendation Clinical Impression  Pt made NPO and orders placed for cortrak given clinical decline.  Two daughters at bedside and offered translation.  Mr Pile generally maintains eyes closed (consistent with behavior at time of MBS on 2/18); better able to follow commands today.  Offered trials of PO to determine if pt is safe to consume any POs.  Pt accepted sips of thin and nectar-thick liquid from a straw - both consistencies led to intermittent coughing, concerning for aspiration (MBS revealed + cough response during singular episode of trace aspiration). Purees were manipulated with palpable swallow and retention of material left oral cavity, C/W performance last week.  After a few bites/sips, pt became sleepy and most of last pureed bolus was suctioned from his mouth.  Agree that cortrak is warranted at this time - RD, Jae Dire, arrived and we discussed the benefit of short-term TF given Mr. Dymek' lethargy.  Please continue to allow purees (applesauce/pudding) from floor stock and occasional ice chips to help maintain active swallowing. SLP will continue to follow for swallowing and cognitive treatment.     HPI HPI: Gregory Osborn is a 75 y.o. male with a history of UTI, urinary retention, hematuria, and appendectomy. He was seen by his urologist yesterday, and was doing well. Plans were made to undergo ultrasound, and to treat for UTI. He has been doing well up until 11:25am when he was noted to develop left hemiplegia. EMS was contacted, and took him to the hospital ED where stroke workup was undertaken. He was noted to have left hemiplegia and neglect. He was not given tPA due to the hematuria. Transferred urgently for thrombectomy. 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 2/19 presented with clinical decline, somnolence, not following commands. CT head showed increase midline shift to 12 mm and edema.  2/21 with some improved mentation.      SLP Plan  Continue with current plan of care       Recommendations  Diet recommendations: NPO (except purees from food stock) Medication Administration: Crushed with puree Compensations: Lingual sweep for clearance of pocketing;Minimize environmental distractions;Follow solids with liquid (suction oral cavity after POs) Postural Changes and/or Swallow Maneuvers: Seated upright 90 degrees                Oral Care Recommendations: Oral care QID SLP Visit Diagnosis: Dysphagia, oral phase (R13.11) Plan: Continue with current plan of care       GO              Chenita Ruda L. Samson Frederic, MA CCC/SLP Acute Rehabilitation Services Office number [phone] Pager [phone]  Carolan Shiver 04/07/2020, 1:58 PM

## 2020-04-07 NOTE — Procedures (Signed)
Cortrak  Person Inserting Tube:  Averett, Amanda E, RD Tube Type:  Cortrak - 43 inches Tube Location:  Left nare Initial Placement:  Stomach Secured by: Bridle Technique Used to Measure Tube Placement:  Documented cm marking at nare/ corner of mouth Cortrak Secured At:  70 cm   Cortrak Tube Team Note:  Consult received to place a Cortrak feeding tube.   No x-ray is required. RN may begin using tube.   If the tube becomes dislodged please keep the tube and contact the Cortrak team at [email or web address] (password [email or web address]) for replacement.  If after hours and replacement cannot be delayed, place a NG tube and confirm placement with an abdominal x-ray.    Amanda Averett, MS, RD, LDN RD pager number and weekend/on-call pager number located in Amion.    

## 2020-04-07 NOTE — Evaluation (Signed)
Physical Therapy Evaluation Patient Details Name: Gregory Osborn MRN: [id number] DOB: 1945-10-05 Today's Date: 04/07/2020   History of Present Illness  75 yo male admmitted to [facility] ED on 2/16 with L hemiplegia and facial drop, imaging reveals R M1 occlusion with attempted thrombectomy (unsuccessful). Small R SAH, likely due to hemorrhagic conversion. ETT 2/16-present. PMH includes HTN, urinary retention with frequent UTIs, BPH, hematuria.  Clinical Impression   Pt presents with L sided hemiplegia, L inattention with eyes not crossing midline towards L, poor sitting balance, and decreased activity tolerance. Pt to benefit from acute PT to address deficits. Pt requiring total +2 for supine<>sit and EOB sitting, tolerating EOB sitting x10 minutes with posterior support. Pt exhibiting pushing behaviors towards L side, corrected with placing RUE in pt lap. PT recommending SNF level of care post-acutely. PT to progress mobility as tolerated, and will continue to follow acutely.      Follow Up Recommendations SNF    Equipment Recommendations  Rolling walker with 5" wheels;Wheelchair (measurements PT);Wheelchair cushion (measurements PT);Hospital bed    Recommendations for Other Services       Precautions / Restrictions Precautions Precautions: Fall Restrictions Weight Bearing Restrictions: No      Mobility  Bed Mobility Overal bed mobility: Needs Assistance Bed Mobility: Supine to Sit;Sit to Sidelying     Supine to sit: Total assist;+2 for physical assistance;+2 for safety/equipment   Sit to sidelying: Total assist;+2 for physical assistance;+2 for safety/equipment General bed mobility comments: total +2 for all aspects, including trunk and LE management, boost up in bed upon return to supine, and hip placement at EOB with assist of bed pads.    Transfers                 General transfer comment: nt  Ambulation/Gait                Stairs             Wheelchair Mobility    Modified Rankin (Stroke Patients Only) Modified Rankin (Stroke Patients Only) Pre-Morbid Rankin Score: No symptoms Modified Rankin: Severe disability     Balance Overall balance assessment: Needs assistance Sitting-balance support: No upper extremity supported;Feet supported Sitting balance-Leahy Scale: Zero Sitting balance - Comments: mod-max posterior assist to maintain upright sitting, strong pushing with RUE towards L requiring frequent placement of RUE in pt lap. Postural control: Posterior lean                                   Pertinent Vitals/Pain Pain Assessment: Faces Faces Pain Scale: Hurts a little bit Pain Location: head (dizzy) Pain Descriptors / Indicators: Discomfort Pain Intervention(s): Limited activity within patient's tolerance;Monitored during session;Repositioned    Home Living Family/patient expects to be discharged to:: Private residence Living Arrangements: Spouse/significant other Available Help at Discharge: Family                  Prior Function           Comments: unsure, family not present on eval     Hand Dominance   Dominant Hand: Left    Extremity/Trunk Assessment   Upper Extremity Assessment Upper Extremity Assessment: Defer to OT evaluation LUE Deficits / Details: Flaccid LUE. Sublux 1 finger width LUE Coordination: decreased fine motor;decreased gross motor    Lower Extremity Assessment Lower Extremity Assessment: LLE deficits/detail LLE Deficits / Details: no active movement any muscle group, flaccidity throughout.  Cervical / Trunk Assessment Cervical / Trunk Assessment: Kyphotic  Communication   Communication: Expressive difficulties  Cognition Arousal/Alertness: Awake/alert Behavior During Therapy: WFL for tasks assessed/performed Overall Cognitive Status: No family/caregiver present to determine baseline cognitive functioning Area of Impairment: Attention;Following  commands;Safety/judgement;Problem solving;Awareness                   Current Attention Level: Focused   Following Commands: Follows one step commands inconsistently Safety/Judgement: Decreased awareness of deficits;Decreased awareness of safety Awareness: Intellectual Problem Solving: Slow processing;Decreased initiation;Difficulty sequencing;Requires tactile cues;Requires verbal cues General Comments: pt states he is in the hospital because he "fainted", PT oriented pt to CVA. Pt with strong R gaze preference, able to reach midline with multimodal cuing. RUE pushing towards L, corrected with placing pt's hand in his lap. Pt mostly responds "yes" to all questions during session      General Comments General comments (skin integrity, edema, etc.): R gaze preference with L inattention, subluxation L shoulder (approximated in sitting tasks with LUE propping)    Exercises     Assessment/Plan    PT Assessment Patient needs continued PT services  PT Problem List Decreased strength;Decreased mobility;Decreased safety awareness;Decreased activity tolerance;Decreased balance;Decreased knowledge of use of DME;Pain;Impaired tone;Decreased coordination;Decreased cognition       PT Treatment Interventions DME instruction;Therapeutic activities;Gait training;Therapeutic exercise;Balance training;Patient/family education;Functional mobility training;Neuromuscular re-education    PT Goals (Current goals can be found in the Care Plan section)  Acute Rehab PT Goals Patient Stated Goal: none stated PT Goal Formulation: Patient unable to participate in goal setting Time For Goal Achievement: 04/21/20 Potential to Achieve Goals: Fair    Frequency Min 3X/week   Barriers to discharge        Co-evaluation PT/OT/SLP Co-Evaluation/Treatment: Yes Reason for Co-Treatment: For patient/therapist safety;To address functional/ADL transfers PT goals addressed during session: Mobility/safety with  mobility;Balance OT goals addressed during session: ADL's and self-care       AM-PAC PT "6 Clicks" Mobility  Outcome Measure Help needed turning from your back to your side while in a flat bed without using bedrails?: Total Help needed moving from lying on your back to sitting on the side of a flat bed without using bedrails?: Total Help needed moving to and from a bed to a chair (including a wheelchair)?: Total Help needed standing up from a chair using your arms (e.g., wheelchair or bedside chair)?: Total Help needed to walk in hospital room?: Total Help needed climbing 3-5 steps with a railing? : Total 6 Click Score: 6    End of Session   Activity Tolerance: Patient limited by fatigue Patient left: in bed;with call bell/phone within reach;with bed alarm set;with SCD's reapplied Nurse Communication: Mobility status PT Visit Diagnosis: Other abnormalities of gait and mobility (R26.89);Hemiplegia and hemiparesis Hemiplegia - Right/Left: Left Hemiplegia - dominant/non-dominant: Dominant Hemiplegia - caused by: Cerebral infarction    Time: 6063-0160 PT Time Calculation (min) (ACUTE ONLY): 28 min   Charges:   PT Evaluation $PT Eval Low Complexity: 1 Low        Kaely Hollan S, PT Acute Rehabilitation Services Pager [phone]  Office [phone]   Truddie Coco 04/07/2020, 4:01 PM

## 2020-04-07 NOTE — Progress Notes (Signed)
Assisted tele visit to patient with family member.  Johan Antonacci D Jakya Dovidio, RN   

## 2020-04-07 NOTE — Progress Notes (Addendum)
STROKE TEAM PROGRESS NOTE   INTERVAL HISTORY No acute event overnight. Still had low K, and Na this am 161. Off 3% saline. Has not pass swallow yet. Still has mild HA on the right, throbbing.    Vitals:   04/07/20 0515 04/07/20 0530 04/07/20 0545 04/07/20 0600  BP: (!) 129/59 (!) 153/72 (!) 124/52 (!) 143/73  Pulse: 74 96 69 100  Resp: 20 13 18 14   Temp:      TempSrc:      SpO2:    98%  Weight:      Height:       CBC:  Recent Labs  Lab 04/02/20 2208 04/05/20 0304 04/06/20 0352 04/07/20 0320  WBC 14.8*   < > 10.3 9.3  NEUTROABS 14.0*  --   --   --   HGB 14.9   < > 14.5 14.8  HCT 42.4   < > 43.6 45.1  MCV 84.8   < > 86.5 87.4  PLT 244   < > 243 256   < > = values in this interval not displayed.   Basic Metabolic Panel:  Recent Labs  Lab 04/06/20 1547 04/06/20 2122 04/07/20 0320  NA 161* 156* 160*  K 2.8*  --  2.6*  CL >130*  --  122*  CO2 19*  --  22  GLUCOSE 128*  --  115*  BUN 12  --  12  CREATININE 0.83  --  0.76  CALCIUM 9.4  --  10.0  MG  --   --  2.3   Lipid Panel:  Recent Labs  Lab 04/03/20 1005  CHOL 197  TRIG 109  HDL 57  CHOLHDL 3.5  VLDL 22  LDLCALC 04/05/20*   HgbA1c:  Recent Labs  Lab 04/03/20 1005  HGBA1C 5.6   Urine Drug Screen:  Recent Labs  Lab 04/02/20 1809  LABOPIA NONE DETECTED  COCAINSCRNUR NONE DETECTED  LABBENZ NONE DETECTED  AMPHETMU NONE DETECTED  THCU NONE DETECTED  LABBARB NONE DETECTED    Alcohol Level  Recent Labs  Lab 04/02/20 2208  ETH <10    IMAGING past 24 hours 2209 EKG SITE RITE  Result Date: 04/06/2020 If Site Rite image not attached, placement could not be confirmed due to current cardiac rhythm.  PHYSICAL EXAM  Temp:  [97.3 F (36.3 C)-99.8 F (37.7 C)] 98.7 F (37.1 C) (02/21 0400) Pulse Rate:  [61-101] 100 (02/21 0600) Resp:  [13-32] 14 (02/21 0600) BP: (114-166)/(49-81) 143/73 (02/21 0600) SpO2:  [95 %-100 %] 98 % (02/21 0600)  General - Well nourished, well developed elderly Hispanic  male, in no apparent distress.   Ophthalmologic - fundi not visualized due to noncooperation.  Mental Status -  Somnolent. Keeps eyes closed at all times. Does not open to command. Responds to name calling with "Si" and brief eye opening. Able to state his name with repeated requests and tactile stimulation. Does not respond to most questions or conversation.  Cranial Nerves II - XII - II -  PERRL 12mm  III, IV, VI - Eyes appear to be in the midline V - Unable to assess facial sensation  VII -mild left lower facial weakness.   VIII - No response to name calling. UTA.  XII - UTA   Motor Strength -  Spontaneously moving RUE and RLE this morning with tactile stimulation. Withdraws LLE.   Sensory - Light touch, temperature/pinprick were assessed and were symmetrical     Coordination - left plegia   Gait  and Station - deferred.   ASSESSMENT/PLAN Gregory Osborn is a 75 y.o. male with a history of recent urologic issues including UTI (01/22), urinary retention with foley cath placed, hematuria (likely traumatic due to self cath) s/p cystoscopy 04/01/20 by Dr. [facility])  showing BPH with recommended surgical treatment. Surgical history includes appendectomy.He had been doing well up until 11:25am when he was noted to develop left hemiplegia. EMS was contacted, and took him to the [facility] ED where stroke workup was undertaken. He was noted to have left hemiplegia and neglect. NIHSS score 15.  He was not given tPA due to the hematuria. Transferred urgently to [facility] for thrombectomy which was aborted after multiple attempts which were unsuccessful.   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    MRI showed large right MCA infarct involving entire right MCA, consistent  with malignant right MCA syndrome.  Small right SAH likely due to hemorrhagic conversion.    MRA head showed right M1 occluded.   2D Echo EF 60-65%, No shunt   LDL 118  HgbA1c 5.6  VTE prophylaxis - heparin subq  No antithrombotics PTA, now on ASA 300 PR mg daily as no po access  Therapy recommendations:  CIR  Disposition:  TBD   Cerebral Edema  Critical care medicine team consulted.  Repeat HCT 2/19 shows worsening shift and edema, MLS at 18mm  3% saline with 2 PIVs at 150cc/hr -> 23.4% 30cc with 3% saline @ 75   Na goal 155-160  Na check Q6h  Neurosurgery, Dr. Jake Samples, consulted and advised patient is not a candidate for surgical intervention at this time.   On Depakote for headache management.    Hypertensive urgency  Home meds:  Lisinopril 5mg  daily   On cleviprex  Will consider po meds once po access  . BP goal < 160 given hemorrhagic conversion . Long-term BP goal normotensive  HLD   no statin prior to admission  LDL 118, goal < 70  Consider statin once po access  Dysphagia   NPO after mental decline  On IVF with NS now  Speech on board  Consider cortrak if not able to swallow in am   Other Stroke Risk Factors  ETOH use, alcohol level <10, family reports he drinks regularly but not a problem.   Urinary retention d/t BPH present on admission  Developed urinary retention and UTI about a month ago necessitating trial of intermittent self cath (failed) and then subsequent foley placement  S/p cystoscopy 2/15 with recommended surgical intervention for BPH by Dr. 3/15 [facility])  Hematuria appears transient after attempts at self cath per urology notes review. Hematuria not present on on UA yesterday.   Continue foley catheter, Monitor for hematuria. No signs of active bleeding. hgb stable. Foley output remains clear yellow.   Other Active Problems    Hospital day # 5  Delila A Bailey-Modzik, NP-C   ATTENDING NOTE: I reviewed above  note and agree with the assessment and plan. Pt was seen and examined.   RN and three male family members are at the bedside. Pt lethargic but open eyes and able to keep eyes open, orientated to place, situation but not to time or people. Able to name and repeat and follow simple central commands but not peripheral commands.  Pt is left handed. Pt has spontaneous speech, seems fluent, but mild to moderate dysarthria. Left visual neglect vs. Hemianopia. Right gaze preference not able to cross midline. Right pupil 21mm and left 13mm, sluggish to light. Left facial droop. Left UE and LE mild withdraw to pain. RUE and RLE against gravity, seems to have normal strength. No babinski on the left. Sensation, coordination not cooperative and gait not tested.  Pt neuro stable at this time, seems improving. Na this am 161, 3% saline has been on hold, will do NS @ 50. If not passing swallow, will do cortrak. His K still low, on K runs. If po access, will give po supplement. Still on cleviprex now and BP goal < 160, will start po meds once po access.   I had long discussion with family at bedside, updated pt current condition, treatment plan and potential prognosis, and answered all the questions. They expressed understanding and appreciation.    Marvel Plan, MD PhD Stroke Neurology 04/07/2020 7:42 AM  This patient is critically ill due to large right MCA infarct with cerebral edema and brain herniation, dysphagia, hypokalemia and at significant risk of neurological worsening, death form brain herniation, hemorrhagic conversion. This patient's care requires constant monitoring of vital signs, hemodynamics, respiratory and cardiac monitoring, review of multiple databases, neurological assessment, discussion with family, other specialists and medical decision making of high complexity. I spent 40 minutes of neurocritical care time in the care of this patient. I discussed with Dr. Denese Killings.      To contact Stroke  Continuity provider, please refer to [email or web address]. After hours, contact General Neurology

## 2020-04-07 NOTE — Progress Notes (Signed)
NAME:  Gregory Osborn, MRN:  [id number], DOB:  08/03/1945, LOS: 5 ADMISSION DATE:  04/02/2020, CONSULTATION DATE:  04/03/2020 REFERRING MD:  Pearlean Brownie, CHIEF COMPLAINT:  R M1 occlusion   Brief History:  75 year old male who presented to [facility] with L hemiplegia, L facial droop, slurred speech after fall. 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 TPA not given due to hematuria history. Emergently transferred to [facility] IR for thrombectomy (unsuccessful).  History of Present Illness:  Gregory Osborn is a 75 year old male with history of HTN (recently started on lisinopril), BPH, urinary retention with frequent UTIs (recently utilizing an indwelling Foley catheter at home, followed by Urology) and hematuria (likely 2/2 traumatic catheterization). Per family he was doing well overall 2/16 until 1125 when he was reportedly had a fall at home. Family found him with new onset of L-sided paralysis, L facial droop and slurred speech and called EMS. He was taken to [facility] for evaluation and diagnosed with R M1 occlusion. No TPA was administered, given patient's recent report of hematuria. He was emergently transferred to [facility] for IR thrombectomy/recanalization.   Per IR procedure note, temporary recanalization was achieved multiple times but resulted in subsequent reocclusion (8 pass attempts made). Post-procedure, patient was transferred to 4N. PCCM was consulted 2/17 in the setting of expected post-stroke edema for possible need for reintubation and ventilatory support.  Today, patient reports ongoing LUE/LLE weakness and chest discomfort (reproducible). Denies recent fever/chills, SOB/dyspnea, n/v/d, sick contacts. He has had some urinary symptoms (hesitancy, hematuria) as noted above for which he is well-established with urology.  Past Medical History:  HTN, urinary retention with frequent UTIs, BPH, hematuria  Significant Hospital Events:  2/16 Presented to [facility] with L hemiplegia/facial droop, LKW 2/16 1125. Dx with  R M1 occlusion, transferred to [facility], IR attempted thrombectomy (unsuccessful)  Consults:  PCCM  Procedures:  ETT 2/16  Significant Diagnostic Tests:   CT Head 2/16 >> complete R MCA territory infarction with at least partial involvement of the basal ganglia, areas of hyperdensity in the deep insula/frontoparietal junction could represent a combination of contrast staining and small amount of hemorrhage, no large confluent hematoma, mild swelling with early mass effect, R to L shift of 69mm  IR Percutaneous Art Thrombectomy 2/16  Micro Data:  2/16 COVID >> negative  Antimicrobials:  N/A  Interim History / Subjective:  Hypernatremia overnight with hypertonic saline  Objective   Blood pressure (!) 124/56, pulse 69, temperature 98.4 F (36.9 C), temperature source Axillary, resp. rate 17, height 5\' 5"  (1.651 m), weight 76.3 kg, SpO2 100 %.        Intake/Output Summary (Last 24 hours) at 04/07/2020 0858 Last data filed at 04/07/2020 0800 Gross per 24 hour  Intake 2866.56 ml  Output 3350 ml  Net -483.44 ml   Filed Weights   04/02/20 1519 04/02/20 1730  Weight: 77.1 kg 76.3 kg   Examination: General: Chronically ill appearing elderly male lying in bed, in NAD HEENT: [place]/AT, MM pink/moist, PERRL,  Neuro: Alert and interactive, can follow simple commands CV: s1s2 regular rate and rhythm, no murmur, rubs, or gallops,  PULM:  Clear to ascultation bilaterally, no added breath sounds, no increased work of breathing GI: soft, bowel sounds active in all 4 quadrants, non-tender, non-distended Extremities: warm/dry, no edema  Skin: no rashes or lesions  Resolved Hospital Problem list    Assessment & Plan:  Right M1 occlusion resulting in acute right MCA infarct with associated L hemiplegia -Embolic pattern, source unclear  S/p  attempted IR thrombectomy/recanalization Small right SAH -Likely due to hemorrhage conversion  P:: Management per neurology  Maintain neuro protective  measures; goal for eurothermia, euglycemia, eunatermia, normoxia, and PCO2 goal of 35-40 Nutrition and bowel regiment  Seizure precautions  AEDs per neurology  Aspirations precautions   Cerebral edema with AMS  -Required titration of hypertonic saline due to decreased mentation  -Repeat HCT 2/19 shows worsening shift and edema with midline shift P: S/P hypertonic saline per neurology  Stopped overnight due to rapid rise in Na levels  Na goal 155-160 Frequent neuro checks  Patient is no a candidate for any neurosurgery interventions Depakote for HA management   Hypertensive urgency  -Home medications includes Lisinopril  P: No longer on Cleviprex Monitor hemodynamic in the ICU setting   Urinary retention complicated by frequent UTI BPH with chronic urinary retention P: Maintain indwelling foley cath  Monitor urine output  Monitor for signs of further hematuria   At risk malnutrition  P: Likely will need cortrack placement today     Daily Goals Checklist  Pain/Anxiety/Delirium protocol (if indicated): none Neuro vitals: every 1 hours AED's: valproate VAP protocol (if indicated): not intubated Respiratory support goals: aspiration precautions.  Blood pressure target: SBP 120-160. Adding prn labetalol DVT prophylaxis: heparin tid Nutrition Status: on hold given mental status. Cortrak tomorrow if mental status not improved.  GI prophylaxis: not indicated.  Fluid status goals: allowing autoregulation Urinary catheter: external catheter.  Central lines: PIV only Glucose control: euglycemic on no treatment Mobility/therapy needs: bedrest Antibiotic de-escalation: none Home medication reconciliation: on hold Daily labs: sodium q6h while on hypertonic saline.  Code Status: full code  Family Communication: updated yesterday by Dr Roda Shutters Disposition: ICU.  Goals of Care:  Last date of multidisciplinary goals of care discussion: With Neurology 2/19 Family and staff present:  son and wife Summary of discussion: per Neurology Follow up goals of care discussion due: 2/23 Code Status: Full  Labs   CBC: Recent Labs  Lab 04/02/20 2208 04/05/20 0304 04/06/20 0352 04/07/20 0320  WBC 14.8* 14.2* 10.3 9.3  NEUTROABS 14.0*  --   --   --   HGB 14.9 13.7 14.5 14.8  HCT 42.4 39.8 43.6 45.1  MCV 84.8 85.6 86.5 87.4  PLT 244 211 243 256    Basic Metabolic Panel: Recent Labs  Lab 04/04/20 0233 04/04/20 0957 04/05/20 0304 04/05/20 0932 04/06/20 0352 04/06/20 0843 04/06/20 1547 04/06/20 2122 04/07/20 0320  NA 145   < > 148*   < > 157* 153* 161* 156* 160*  K 3.3*  --  3.2*  --  2.4*  --  2.8*  --  2.6*  CL 113*  --  115*  --  124*  --  >130*  --  122*  CO2 24  --  21*  --  21*  --  19*  --  22  GLUCOSE 123*  --  106*  --  129*  --  128*  --  115*  BUN 9  --  8  --  14  --  12  --  12  CREATININE 0.88  --  0.79  --  0.77  --  0.83  --  0.76  CALCIUM 8.6*  --  9.1  --  9.4  --  9.4  --  10.0  MG  --   --   --   --   --   --   --   --  2.3   < > =  values in this interval not displayed.   GFR: Estimated Creatinine Clearance: 79.6 mL/min (by C-G formula based on SCr of 0.76 mg/dL). Recent Labs  Lab 04/02/20 2208 04/05/20 0304 04/06/20 0352 04/07/20 0320  WBC 14.8* 14.2* 10.3 9.3    Liver Function Tests: Recent Labs  Lab 04/02/20 2208  AST 29  ALT 18  ALKPHOS 86  BILITOT 0.8  PROT 6.5  ALBUMIN 3.4*   No results for input(s): LIPASE, AMYLASE in the last 168 hours. No results for input(s): AMMONIA in the last 168 hours.  ABG No results found for: PHART, PCO2ART, PO2ART, HCO3, TCO2, ACIDBASEDEF, O2SAT   Coagulation Profile: Recent Labs  Lab 04/02/20 2208  INR 1.0    Cardiac Enzymes: No results for input(s): CKTOTAL, CKMB, CKMBINDEX, TROPONINI in the last 168 hours.  HbA1C: Hgb A1c MFr Bld  Date/Time Value Ref Range Status  04/03/2020 10:05 AM 5.6 4.8 - 5.6 % Final    Comment:    (NOTE) Pre diabetes:           5.7%-6.4%  Diabetes:              >6.4%  Glycemic control for   <7.0% adults with diabetes     CBG: Recent Labs  Lab 04/02/20 2006 04/05/20 1630  GLUCAP 176* 92    CRITICAL CARE Performed by: Delfin Gant  Total critical care time: 39 minutes  Critical care time was exclusive of separately billable procedures and treating other patients.  Critical care was necessary to treat or prevent imminent or life-threatening deterioration.  Critical care was time spent personally by me on the following activities: development of treatment plan with patient and/or surrogate as well as nursing, discussions with consultants, evaluation of patient's response to treatment, examination of patient, obtaining history from patient or surrogate, ordering and performing treatments and interventions, ordering and review of laboratory studies, ordering and review of radiographic studies, pulse oximetry, re-evaluation of patient's condition and participation in multidisciplinary rounds.  Delfin Gant, NP-C [facility] Personal contact information can be found on Amion  If no response please page: Adult pulmonary and critical care medicine pager on Amion unitl 7pm After 7pm please call [phone] 04/07/2020, 9:31 AM

## 2020-04-08 ENCOUNTER — Encounter (HOSPITAL_COMMUNITY): Payer: Self-pay | Admitting: Neurology

## 2020-04-08 ENCOUNTER — Inpatient Hospital Stay (HOSPITAL_COMMUNITY): Payer: Medicare HMO

## 2020-04-08 ENCOUNTER — Other Ambulatory Visit: Payer: Self-pay

## 2020-04-08 DIAGNOSIS — N179 Acute kidney failure, unspecified: Secondary | ICD-10-CM

## 2020-04-08 DIAGNOSIS — R509 Fever, unspecified: Secondary | ICD-10-CM | POA: Diagnosis not present

## 2020-04-08 DIAGNOSIS — I63511 Cerebral infarction due to unspecified occlusion or stenosis of right middle cerebral artery: Secondary | ICD-10-CM | POA: Diagnosis not present

## 2020-04-08 DIAGNOSIS — N390 Urinary tract infection, site not specified: Secondary | ICD-10-CM

## 2020-04-08 DIAGNOSIS — T17908A Unspecified foreign body in respiratory tract, part unspecified causing other injury, initial encounter: Secondary | ICD-10-CM

## 2020-04-08 DIAGNOSIS — I63011 Cerebral infarction due to thrombosis of right vertebral artery: Secondary | ICD-10-CM | POA: Diagnosis not present

## 2020-04-08 LAB — URINALYSIS, ROUTINE W REFLEX MICROSCOPIC
Bilirubin Urine: NEGATIVE
Cellular Cast, UA: 3
Glucose, UA: NEGATIVE mg/dL
Hgb urine dipstick: NEGATIVE
Ketones, ur: NEGATIVE mg/dL
Nitrite: NEGATIVE
Protein, ur: NEGATIVE mg/dL
Specific Gravity, Urine: 1.03 (ref 1.005–1.030)
pH: 5 (ref 5.0–8.0)

## 2020-04-08 LAB — MAGNESIUM: Magnesium: 2.7 mg/dL — ABNORMAL HIGH (ref 1.7–2.4)

## 2020-04-08 LAB — BASIC METABOLIC PANEL
Anion gap: 17 — ABNORMAL HIGH (ref 5–15)
Anion gap: 10 (ref 5–15)
BUN: 58 mg/dL — ABNORMAL HIGH (ref 8–23)
BUN: 34 mg/dL — ABNORMAL HIGH (ref 8–23)
CO2: 17 mmol/L — ABNORMAL LOW (ref 22–32)
CO2: 25 mmol/L (ref 22–32)
Calcium: 8.6 mg/dL — ABNORMAL LOW (ref 8.9–10.3)
Calcium: 9.5 mg/dL (ref 8.9–10.3)
Chloride: 120 mmol/L — ABNORMAL HIGH (ref 98–111)
Chloride: 125 mmol/L — ABNORMAL HIGH (ref 98–111)
Creatinine, Ser: 2.08 mg/dL — ABNORMAL HIGH (ref 0.61–1.24)
Creatinine, Ser: 1.2 mg/dL (ref 0.61–1.24)
GFR, Estimated: 33 mL/min — ABNORMAL LOW (ref >60)
GFR, Estimated: >60 mL/min (ref >60)
Glucose, Bld: 112 mg/dL — ABNORMAL HIGH (ref 70–99)
Glucose, Bld: 117 mg/dL — ABNORMAL HIGH (ref 70–99)
Potassium: 4.5 mmol/L (ref 3.5–5.1)
Potassium: 3.3 mmol/L — ABNORMAL LOW (ref 3.5–5.1)
Sodium: 154 mmol/L — ABNORMAL HIGH (ref 135–145)
Sodium: 160 mmol/L — ABNORMAL HIGH (ref 135–145)

## 2020-04-08 LAB — SODIUM
Sodium: 156 mmol/L — ABNORMAL HIGH (ref 135–145)
Sodium: 160 mmol/L — ABNORMAL HIGH (ref 135–145)
Sodium: 159 mmol/L — ABNORMAL HIGH (ref 135–145)

## 2020-04-08 LAB — CBC
HCT: 44.5 % (ref 39.0–52.0)
Hemoglobin: 14.5 g/dL (ref 13.0–17.0)
MCH: 29.2 pg (ref 26.0–34.0)
MCHC: 32.6 g/dL (ref 30.0–36.0)
MCV: 89.5 fL (ref 80.0–100.0)
Platelets: 256 10*3/uL (ref 150–400)
RBC: 4.97 MIL/uL (ref 4.22–5.81)
RDW: 13.8 % (ref 11.5–15.5)
WBC: 9.4 10*3/uL (ref 4.0–10.5)
nRBC: 0 % (ref 0.0–0.2)

## 2020-04-08 IMAGING — DX DG CHEST 1V PORT
1 series · 1 of 1 positions shown · non-contrast
Comparison: None.

CLINICAL DATA: Fever.  Increased congestion.  Possible aspiration.

EXAM:
PORTABLE CHEST 1 VIEW

[chest ap]
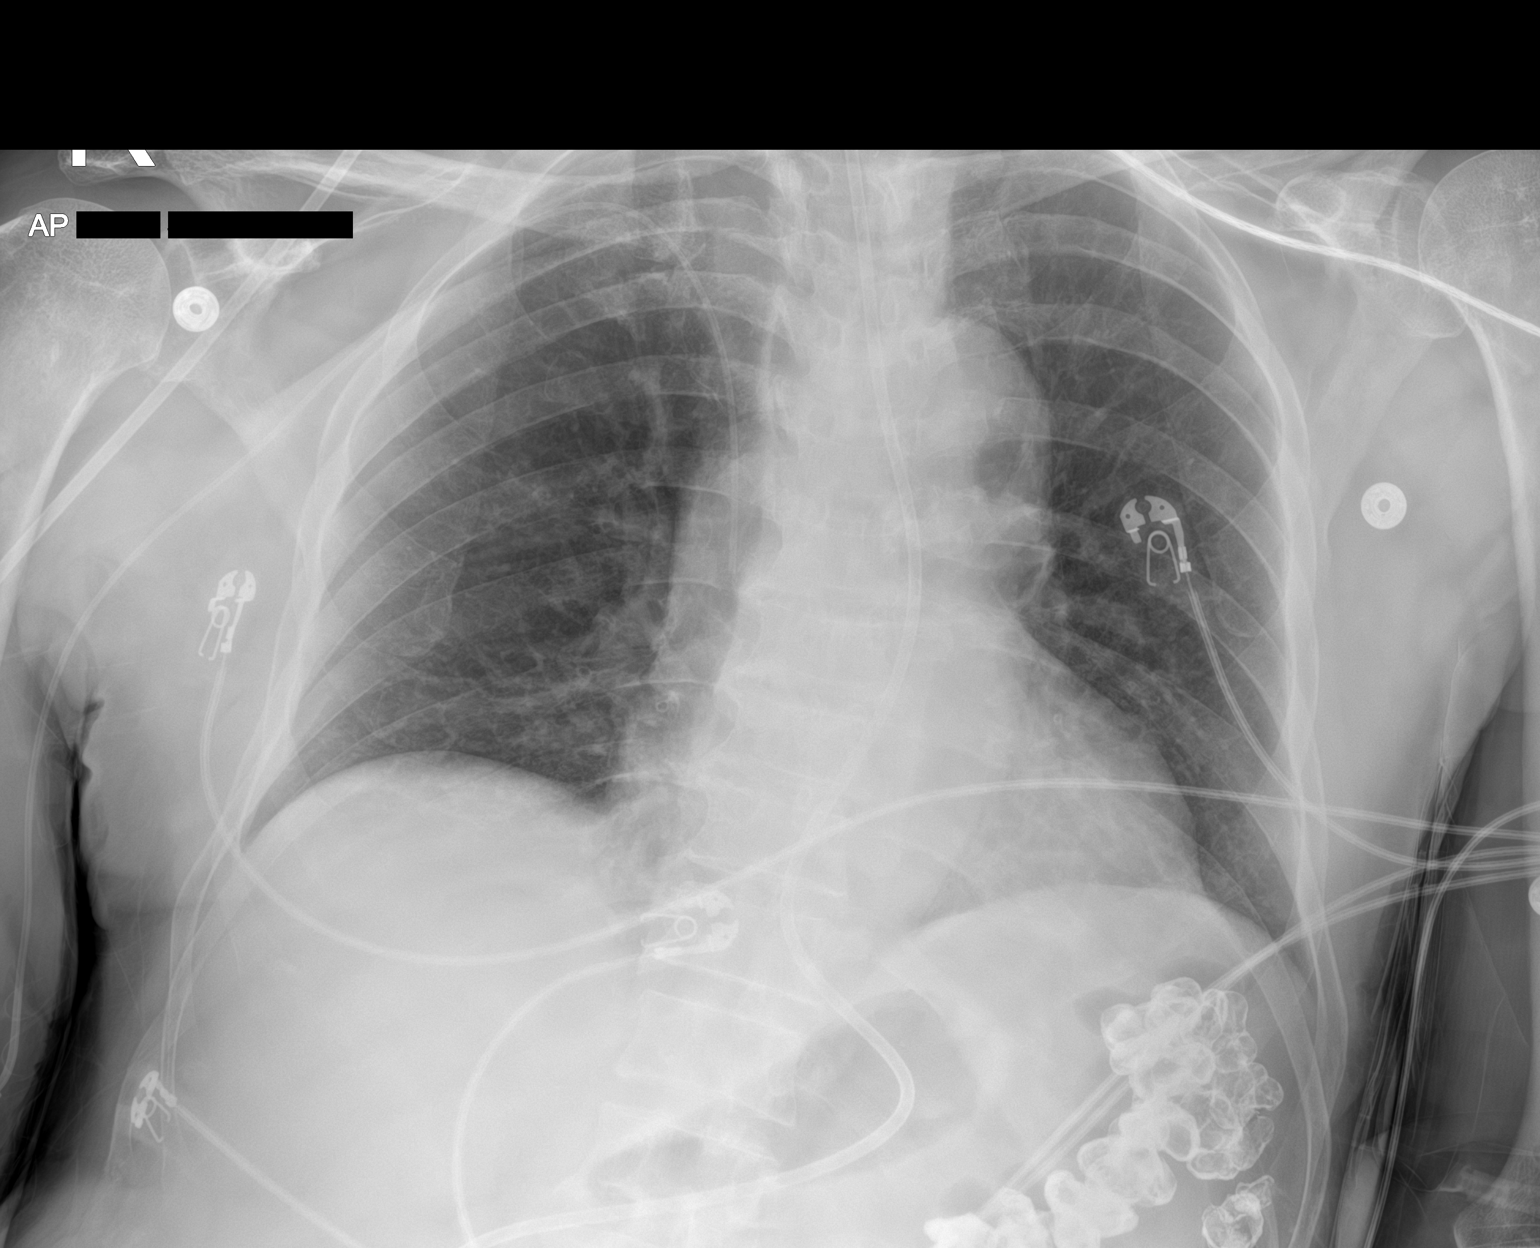

[1 of 1 positions shown; findings below may reference images not displayed]

FINDINGS: The heart size is normal. Small bore feeding tube courses off the
inferior border of the film. Atherosclerotic changes are noted at
the aortic arch. Right-sided PICC line terminates in the distal SVC.

Lung volumes are low. No edema or effusion is present. No focal
airspace disease is evident.
IMPRESSION: 1. Low lung volumes.
2. No acute cardiopulmonary disease.
3. Right-sided PICC line terminates in the distal SVC.

## 2020-04-08 MED ORDER — POTASSIUM CHLORIDE 20 MEQ PO PACK
40.0000 meq | PACK | Freq: Once | ORAL | Status: AC
  Administered 2020-04-08: 40 meq

## 2020-04-08 MED ORDER — VALPROIC ACID 250 MG/5ML PO SOLN
250.0000 mg | Freq: Three times a day (TID) | ORAL | Status: DC
  Administered 2020-04-08 – 2020-04-10 (×8): 250 mg
  Filled 2020-04-08 (×8): qty 5

## 2020-04-08 MED ORDER — FREE WATER
150.0000 mL | Freq: Four times a day (QID) | Status: DC
  Administered 2020-04-08 – 2020-04-09 (×3): 150 mL

## 2020-04-08 MED ORDER — SENNOSIDES-DOCUSATE SODIUM 8.6-50 MG PO TABS
2.0000 | ORAL_TABLET | Freq: Two times a day (BID) | ORAL | Status: DC
  Filled 2020-04-08: qty 2

## 2020-04-08 MED ORDER — POLYETHYLENE GLYCOL 3350 17 G PO PACK
17.0000 g | PACK | Freq: Every day | ORAL | Status: DC
  Administered 2020-04-08 – 2020-04-25 (×18): 17 g
  Filled 2020-04-08 (×16): qty 1

## 2020-04-08 MED ORDER — POTASSIUM CHLORIDE 20 MEQ PO PACK
40.0000 meq | PACK | Freq: Once | ORAL | Status: DC
  Filled 2020-04-08: qty 2

## 2020-04-08 MED ORDER — POLYETHYLENE GLYCOL 3350 17 G PO PACK
17.0000 g | PACK | Freq: Every day | ORAL | Status: DC
  Filled 2020-04-08: qty 1

## 2020-04-08 MED ORDER — FREE WATER
100.0000 mL | Freq: Four times a day (QID) | Status: DC
  Administered 2020-04-08: 100 mL

## 2020-04-08 NOTE — Progress Notes (Signed)
SLP Cancellation Note  Patient Details Name: Kolbe Delmonaco MRN: [id number] DOB: August 27, 1945   Cancelled treatment:       Reason Eval/Treat Not Completed: Patient's level of consciousness; Per RN, pt with lethargy, arouses briefly but not for long; advised to hold off this date. SLP to continue to follow   Ardyth Gal MA, CCC-SLP Acute Rehabilitation Services   04/08/2020, 9:50 AM

## 2020-04-08 NOTE — Progress Notes (Signed)
RT holding CPT at this time due to patient sleeping. RT will attempt again at next scheduled time. RN aware. RT will continue to monitor as needed.

## 2020-04-08 NOTE — Progress Notes (Signed)
STROKE TEAM PROGRESS NOTE   INTERVAL HISTORY Son and RN are at the beside. Pt lethargic this morning, but still able to open eyes on voice and answer limited questions. Had spike fever overnight and still low grade fever this am. Cre sharply up to 2.08 from 0.93, but repeat Cre 1.20. has cortrak yesterday and on TF. UA WBC showed 11-20. Will do urine culture. Na 160, on NS. Will repeat CT in am.  Vitals:   04/08/20 1700 04/08/20 1800 04/08/20 1900 04/08/20 2000  BP: 98/62 117/66 127/73 135/72  Pulse: 95 93 98 (!) 103  Resp: 19 19 19 20   Temp:    99.7 F (37.6 C)  TempSrc:    Axillary  SpO2:    100%  Weight:      Height:       CBC:  Recent Labs  Lab 04/02/20 2208 04/05/20 0304 04/07/20 0320 04/08/20 0524  WBC 14.8*   < > 9.3 9.4  NEUTROABS 14.0*  --   --   --   HGB 14.9   < > 14.8 14.5  HCT 42.4   < > 45.1 44.5  MCV 84.8   < > 87.4 89.5  PLT 244   < > 256 256   < > = values in this interval not displayed.   Basic Metabolic Panel:  Recent Labs  Lab 04/07/20 0320 04/07/20 1224 04/07/20 2202 04/08/20 0524 04/08/20 1102 04/08/20 1558  NA 160*   < > 154*   < > 160* 160*  K 2.6*   < > 4.5  --  3.3*  --   CL 122*   < > 120*  --  125*  --   CO2 22   < > 17*  --  25  --   GLUCOSE 115*   < > 112*  --  117*  --   BUN 12   < > 58*  --  34*  --   CREATININE 0.76   < > 2.08*  --  1.20  --   CALCIUM 10.0   < > 8.6*  --  9.5  --   MG 2.3  --   --   --  2.7*  --    < > = values in this interval not displayed.   Lipid Panel:  Recent Labs  Lab 04/03/20 1005  CHOL 197  TRIG 109  HDL 57  CHOLHDL 3.5  VLDL 22  LDLCALC 04/05/20*   HgbA1c:  Recent Labs  Lab 04/03/20 1005  HGBA1C 5.6   Urine Drug Screen:  Recent Labs  Lab 04/02/20 1809  LABOPIA NONE DETECTED  COCAINSCRNUR NONE DETECTED  LABBENZ NONE DETECTED  AMPHETMU NONE DETECTED  THCU NONE DETECTED  LABBARB NONE DETECTED    Alcohol Level  Recent Labs  Lab 04/02/20 2208  ETH <10    IMAGING past 24 hours DG  CHEST PORT 1 VIEW  Result Date: 04/08/2020 CLINICAL DATA:  Fever.  Increased congestion.  Possible aspiration. EXAM: PORTABLE CHEST 1 VIEW COMPARISON:  None. FINDINGS: The heart size is normal. Small bore feeding tube courses off the inferior border of the film. Atherosclerotic changes are noted at the aortic arch. Right-sided PICC line terminates in the distal SVC. Lung volumes are low. No edema or effusion is present. No focal airspace disease is evident. IMPRESSION: 1. Low lung volumes. 2. No acute cardiopulmonary disease. 3. Right-sided PICC line terminates in the distal SVC. Electronically Signed   By: 04/10/2020.D.  On: 04/08/2020 12:22   PHYSICAL EXAM  Temp:  [98.6 F (37 C)-100.6 F (38.1 C)] 99.7 F (37.6 C) (02/22 2000) Pulse Rate:  [68-112] 103 (02/22 2000) Resp:  [17-39] 20 (02/22 2000) BP: (98-154)/(54-79) 135/72 (02/22 2000) SpO2:  [100 %] 100 % (02/22 2000)  General - Well nourished, well developed elderly Hispanic male, in no apparent distress.   Ophthalmologic - fundi not visualized due to noncooperation.  Neuro - Pt lethargic but open eyes on voice, orientated to place, people, but not to situation, time or age. Not cooperative with name and repeat and follow limited central commands but not peripheral commands. Left visual neglect vs. Hemianopia. Right gaze preference not able to cross midline. Right pupil 88mm and left 34mm, sluggish to light. Left facial droop. Left UE and LE mild withdraw to pain. RUE and RLE against gravity on pain. No babinski on the left. Sensation, coordination not cooperative and gait not tested.   ASSESSMENT/PLAN Gregory Osborn is a 75 y.o. male with a history of recent urologic issues including UTI (01/22), urinary retention with foley cath placed, hematuria (likely traumatic due to self cath) s/p cystoscopy 04/01/20 by Dr[facility]  showing BPH with recommended surgical treatment. Surgical history includes appendectomy.He had been  doing well up until 11:25am when he was noted to develop left hemiplegia. EMS was contacted, and took him to the [facility] ED where stroke workup was undertaken. He was noted to have left hemiplegia and neglect. NIHSS score 15.  He was not given tPA due to the hematuria. Transferred urgently to [facility] for thrombectomy which was aborted after multiple attempts which were unsuccessful.   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    MRI showed large right MCA infarct involving entire right MCA, consistent with malignant right MCA syndrome.  Small right SAH likely due to hemorrhagic conversion.    MRA head showed right M1 occluded.   CT repeat pending  2D Echo EF 60-65%, No shunt   LDL 118  HgbA1c 5.6  VTE prophylaxis - heparin subq  No antithrombotics PTA, now on ASA 325mg   Therapy recommendations:  SNF  Disposition:  pending   Cerebral Edema  Critical care medicine team consulted.  Repeat HCT 2/19 shows worsening shift and edema, MLS at 91mm  CT repeat pending   Na goal 155-160  Na check Q6h  On 3% saline -> NS -> off  Na 154->158->160  Neurosurgery, Dr. 12m, consulted and advised patient is not a candidate for surgical intervention at this time.   On Depakote for headache management.    Hypertensive urgency  Home meds:  Lisinopril 5mg  daily   BP stable now  Off cleviprex  On amlodipine 10  Off lisinopril due to AKI . BP goal < 160 given hemorrhagic conversion . Long-term BP goal normotensive  HLD   no statin prior to admission  LDL 118, goal < 70  On lipitor 40  Continue statin on discharge  Dysphagia   NPO after mental decline  Off IVF  Speech on board  On TF and FW   Fever AKI  Urinary retention d/t BPH present  on admission  Tmax 100.6  Cre 0.93->2.08->1.20  Developed urinary retention and UTI about a month ago necessitating trial of intermittent self cath (failed) and  then subsequent foley placement  S/p cystoscopy 2/15 with recommended surgical intervention for BPH by Dr. Pete Glatter [facility])  Hematuria appears transient after attempts at self cath per urology notes review. Hematuria not present on on UA yesterday.   Continue foley catheter, Monitor for hematuria. No signs of active bleeding. hgb stable. Foley output remains clear yellow.   UA WBC 11-20  Urine culture pending  CXR neg  Other Stroke Risk Factors  ETOH use, alcohol level <10, family reports he drinks regularly but not a problem.   Other Active Problems    Hospital day # 6  This patient is critically ill due to large right MCA stroke with cerebral edema, fever, AKI, UTI and dysphagia and at significant risk of neurological worsening, death form brain herniation, recurrent stroke, hemorrhagic conversion, seizure. This patient's care requires constant monitoring of vital signs, hemodynamics, respiratory and cardiac monitoring, review of multiple databases, neurological assessment, discussion with family, other specialists and medical decision making of high complexity. I spent 35 minutes of neurocritical care time in the care of this patient. I had long discussion with son at bedside, updated pt current condition, treatment plan and potential prognosis, and answered all the questions. He expressed understanding and appreciation.   Marvel Plan, MD PhD Stroke Neurology 04/08/2020 10:29 PM    To contact Stroke Continuity provider, please refer to [email or web address]. After hours, contact General Neurology

## 2020-04-08 NOTE — Progress Notes (Addendum)
NAME:  Gregory Osborn, MRN:  [id number], DOB:  September 10, 1945, LOS: 6 ADMISSION DATE:  04/02/2020, CONSULTATION DATE:  04/03/2020 REFERRING MD:  Pearlean Brownie, CHIEF COMPLAINT:  R M1 occlusion   Brief History:  75 year old male who presented to [facility] with L hemiplegia, L facial droop, slurred speech after fall. 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 TPA not given due to hematuria history. Emergently transferred to [facility] IR for thrombectomy (unsuccessful).  History of Present Illness:  Gregory Osborn is a 75 year old male with history of HTN (recently started on lisinopril), BPH, urinary retention with frequent UTIs (recently utilizing an indwelling Foley catheter at home, followed by Urology) and hematuria (likely 2/2 traumatic catheterization). Per family he was doing well overall 2/16 until 1125 when he was reportedly had a fall at home. Family found him with new onset of L-sided paralysis, L facial droop and slurred speech and called EMS. He was taken to [facility] for evaluation and diagnosed with R M1 occlusion. No TPA was administered, given patient's recent report of hematuria. He was emergently transferred to [facility] for IR thrombectomy/recanalization.   Per IR procedure note, temporary recanalization was achieved multiple times but resulted in subsequent reocclusion (8 pass attempts made). Post-procedure, patient was transferred to 4N. PCCM was consulted 2/17 in the setting of expected post-stroke edema for possible need for reintubation and ventilatory support.  Today, patient reports ongoing LUE/LLE weakness and chest discomfort (reproducible). Denies recent fever/chills, SOB/dyspnea, n/v/d, sick contacts. He has had some urinary symptoms (hesitancy, hematuria) as noted above for which he is well-established with urology.  Past Medical History:  HTN, urinary retention with frequent UTIs, BPH, hematuria  Significant Hospital Events:  2/16 Presented to [facility] with L hemiplegia/facial droop, LKW 2/16 1125. Dx with  R M1 occlusion, transferred to [facility], IR attempted thrombectomy (unsuccessful)  Consults:  PCCM  Procedures:  ETT 2/16  Significant Diagnostic Tests:   CT Head 2/16 >> complete R MCA territory infarction with at least partial involvement of the basal ganglia, areas of hyperdensity in the deep insula/frontoparietal junction could represent a combination of contrast staining and small amount of hemorrhage, no large confluent hematoma, mild swelling with early mass effect, R to L shift of 48mm  IR Percutaneous Art Thrombectomy 2/16  Micro Data:  2/16 COVID >> negative  Antimicrobials:  N/A  Interim History / Subjective:  Hypertonic saline has been discontinued 2/21 Pt remains awake and aswering simple questions with a nod, Yes or no. Lethargic and weak Low grade temp this am 100.6>> tachycardia ( tx with tylenol) Chest congestion this am, states secretions in his throat he cannot cough up ( cough is  weak) Sats are 100% on RA Na 156  WBC 9.4 Per nursing and family, he is less alert 1/22 am.  Net negative 5 L    Objective   Blood pressure 131/67, pulse 96, temperature 99.5 F (37.5 C), temperature source Axillary, resp. rate 19, height 5\' 5"  (1.651 m), weight 76.3 kg, SpO2 100 %.        Intake/Output Summary (Last 24 hours) at 04/08/2020 0937 Last data filed at 04/08/2020 0700 Gross per 24 hour  Intake 964.66 ml  Output 1400 ml  Net -435.34 ml   Filed Weights   04/02/20 1519 04/02/20 1730  Weight: 77.1 kg 76.3 kg   Examination: General: Chronically ill appearing elderly male lying in bed, in NAD HEENT: [place]/AT, MM pink/moist, PERRL, 3 mm and reactive Neuro: Alert and interactive, can follow simple commands, weak , Moves R  side spontaneously, WD on L side.  CV: s1s2 regular rate and rhythm, no murmur, rubs, or gallops,  PULM:  Bilateral chest excursion, Coarse rhonchi, weak cough, sats are 100% on RA GI: soft, bowel sounds active in all 4 quadrants, non-tender,  non-distended, tolerating TF Extremities: warm/dry, no edema , spontaneously moves right, WD on L.  Skin: no rashes or lesions, warm dry and intact  Resolved Hospital Problem list    Assessment & Plan:  Right M1 occlusion resulting in acute right MCA infarct with associated L hemiplegia -Embolic pattern, source unclear  S/p attempted IR thrombectomy/recanalization Small right SAH -Likely due to hemorrhage conversion  P:: Management per neurology  Maintain neuro protective measures; goal for eurothermia, euglycemia, eunatermia, normoxia, and PCO2 goal of 35-40 Nutrition and bowel regiment  Seizure precautions  AEDs per neurology  Aspirations precautions   New Fever>> WBC 9.4 Cough Chest congestion Concern for aspiration Plan CXR now Tylenol for fever Chest PT NTS prn decrease in sats Trend fever and WBC Blood Cx and UA now  Cerebral edema with AMS  -Required titration of hypertonic saline due to decreased mentation  -Repeat HCT 2/19 shows worsening shift and edema with midline shift P: S/P hypertonic saline per neurology  Stopped overnight due to rapid rise in Na levels  Na goal 155-160 Frequent neuro checks  Patient is no a candidate for any neurosurgery interventions Depakote for HA management   Hypertensive urgency  -Home medications includes Lisinopril  P: No longer on Cleviprex Monitor hemodynamics in the ICU setting   Urinary retention complicated by frequent UTI BPH with chronic urinary retention Requires I&O cath at home P: Maintain indwelling foley cath  Monitor urine output  Monitor for signs of further hematuria   At risk malnutrition  P: Cortrack with TF infusing at 55/hr. Goal is 60, should reach goal 2/22     Daily Goals Checklist  Pain/Anxiety/Delirium protocol (if indicated): none Neuro vitals: every 1 hours AED's: valproate VAP protocol (if indicated): not intubated Respiratory support goals: aspiration precautions.  Blood pressure  target: SBP 120-160. Adding prn labetalol DVT prophylaxis: heparin tid Nutrition Status: TF infusing per Cortrack  GI prophylaxis: not indicated.  Fluid status goals: allowing autoregulation Urinary catheter: Foley cath Central lines: PIV only Glucose control: euglycemic on no treatment Mobility/therapy needs: bedrest Antibiotic de-escalation: none Home medication reconciliation: on hold Daily labs: sodium q6h while on hypertonic saline.  Code Status: DNR  Family Communication: Family at bedside and updated at bedside per Dr. Denese Killings 2/22 Disposition: ICU.  Goals of Care:  Last date of multidisciplinary goals of care discussion: With Neurology 2/19 Family and staff present: son and wife Summary of discussion: per Neurology Follow up goals of care discussion due: 2/23 Code Status: Full  Labs   CBC: Recent Labs  Lab 04/02/20 2208 04/05/20 0304 04/06/20 0352 04/07/20 0320 04/08/20 0524  WBC 14.8* 14.2* 10.3 9.3 9.4  NEUTROABS 14.0*  --   --   --   --   HGB 14.9 13.7 14.5 14.8 14.5  HCT 42.4 39.8 43.6 45.1 44.5  MCV 84.8 85.6 86.5 87.4 89.5  PLT 244 211 243 256 256    Basic Metabolic Panel: Recent Labs  Lab 04/06/20 1547 04/06/20 2122 04/07/20 0320 04/07/20 1224 04/07/20 1356 04/07/20 1536 04/07/20 2201 04/07/20 2202 04/08/20 0524  NA 161*   < > 160* 156* 156* 158* 158* 154* 156*  K 2.8*  --  2.6* 5.2* 3.0*  --   --  4.5  --  CL >130*  --  122* 124* 120*  --   --  120*  --   CO2 19*  --  22 23 24   --   --  17*  --   GLUCOSE 128*  --  115* 107* 114*  --   --  112*  --   BUN 12  --  12 14 16   --   --  58*  --   CREATININE 0.83  --  0.76 0.84 0.91  --   --  2.08*  --   CALCIUM 9.4  --  10.0 9.2 9.5  --   --  8.6*  --   MG  --   --  2.3  --   --   --   --   --   --    < > = values in this interval not displayed.   GFR: Estimated Creatinine Clearance: 30.6 mL/min (A) (by C-G formula based on SCr of 2.08 mg/dL (H)). Recent Labs  Lab 04/05/20 0304  04/06/20 0352 04/07/20 0320 04/08/20 0524  WBC 14.2* 10.3 9.3 9.4    Liver Function Tests: Recent Labs  Lab 04/02/20 2208  AST 29  ALT 18  ALKPHOS 86  BILITOT 0.8  PROT 6.5  ALBUMIN 3.4*   No results for input(s): LIPASE, AMYLASE in the last 168 hours. No results for input(s): AMMONIA in the last 168 hours.  ABG No results found for: PHART, PCO2ART, PO2ART, HCO3, TCO2, ACIDBASEDEF, O2SAT   Coagulation Profile: Recent Labs  Lab 04/02/20 2208  INR 1.0    Cardiac Enzymes: No results for input(s): CKTOTAL, CKMB, CKMBINDEX, TROPONINI in the last 168 hours.  HbA1C: Hgb A1c MFr Bld  Date/Time Value Ref Range Status  04/03/2020 10:05 AM 5.6 4.8 - 5.6 % Final    Comment:    (NOTE) Pre diabetes:          5.7%-6.4%  Diabetes:              >6.4%  Glycemic control for   <7.0% adults with diabetes     CBG: Recent Labs  Lab 04/02/20 2006 04/05/20 1630  GLUCAP 176* 92    CRITICAL CARE Performed by: 2007  Total critical care time: 37 minutes  Critical care time was exclusive of separately billable procedures and treating other patients.  Critical care was necessary to treat or prevent imminent or life-threatening deterioration.  Critical care was time spent personally by me on the following activities: development of treatment plan with patient and/or surrogate as well as nursing, discussions with consultants, evaluation of patient's response to treatment, examination of patient, obtaining history from patient or surrogate, ordering and performing treatments and interventions, ordering and review of laboratory studies, ordering and review of radiographic studies, pulse oximetry, re-evaluation of patient's condition and participation in multidisciplinary rounds.  04/07/20, AGACNP-BC [facility] Personal contact information can be found on Amion  If no response please page: Adult pulmonary and critical care medicine pager on  Amion unitl 7pm After 7pm please call [phone] 04/08/2020, 9:37 AM

## 2020-04-08 NOTE — Progress Notes (Signed)
MD aware of low urine output, will slowly add free water.

## 2020-04-09 ENCOUNTER — Inpatient Hospital Stay (HOSPITAL_COMMUNITY): Payer: Medicare HMO

## 2020-04-09 DIAGNOSIS — G935 Compression of brain: Secondary | ICD-10-CM | POA: Diagnosis not present

## 2020-04-09 DIAGNOSIS — I10 Essential (primary) hypertension: Secondary | ICD-10-CM

## 2020-04-09 DIAGNOSIS — R1312 Dysphagia, oropharyngeal phase: Secondary | ICD-10-CM | POA: Diagnosis not present

## 2020-04-09 DIAGNOSIS — I63511 Cerebral infarction due to unspecified occlusion or stenosis of right middle cerebral artery: Secondary | ICD-10-CM | POA: Diagnosis not present

## 2020-04-09 DIAGNOSIS — G936 Cerebral edema: Secondary | ICD-10-CM | POA: Diagnosis not present

## 2020-04-09 DIAGNOSIS — I63011 Cerebral infarction due to thrombosis of right vertebral artery: Secondary | ICD-10-CM | POA: Diagnosis not present

## 2020-04-09 LAB — HEPATIC FUNCTION PANEL
ALT: 160 U/L — ABNORMAL HIGH (ref 0–44)
AST: 90 U/L — ABNORMAL HIGH (ref 15–41)
Albumin: 2.9 g/dL — ABNORMAL LOW (ref 3.5–5.0)
Alkaline Phosphatase: 101 U/L (ref 38–126)
Bilirubin, Direct: 0.2 mg/dL (ref 0.0–0.2)
Indirect Bilirubin: 0.7 mg/dL (ref 0.3–0.9)
Total Bilirubin: 0.9 mg/dL (ref 0.3–1.2)
Total Protein: 6.2 g/dL — ABNORMAL LOW (ref 6.5–8.1)

## 2020-04-09 LAB — BASIC METABOLIC PANEL
Anion gap: 14 (ref 5–15)
BUN: 35 mg/dL — ABNORMAL HIGH (ref 8–23)
CO2: 22 mmol/L (ref 22–32)
Calcium: 9.1 mg/dL (ref 8.9–10.3)
Chloride: 124 mmol/L — ABNORMAL HIGH (ref 98–111)
Creatinine, Ser: 0.98 mg/dL (ref 0.61–1.24)
GFR, Estimated: >60 mL/min (ref >60)
Glucose, Bld: 140 mg/dL — ABNORMAL HIGH (ref 70–99)
Potassium: 3.8 mmol/L (ref 3.5–5.1)
Sodium: 160 mmol/L — ABNORMAL HIGH (ref 135–145)

## 2020-04-09 LAB — BLOOD CULTURE ID PANEL (REFLEXED) - BCID2

## 2020-04-09 LAB — CBC
HCT: 43.8 % (ref 39.0–52.0)
Hemoglobin: 14.6 g/dL (ref 13.0–17.0)
MCH: 29.6 pg (ref 26.0–34.0)
MCHC: 33.3 g/dL (ref 30.0–36.0)
MCV: 88.8 fL (ref 80.0–100.0)
Platelets: 261 10*3/uL (ref 150–400)
RBC: 4.93 MIL/uL (ref 4.22–5.81)
RDW: 14 % (ref 11.5–15.5)
WBC: 12.9 10*3/uL — ABNORMAL HIGH (ref 4.0–10.5)
nRBC: 0 % (ref 0.0–0.2)

## 2020-04-09 LAB — SODIUM
Sodium: 160 mmol/L — ABNORMAL HIGH (ref 135–145)
Sodium: 157 mmol/L — ABNORMAL HIGH (ref 135–145)
Sodium: 153 mmol/L — ABNORMAL HIGH (ref 135–145)

## 2020-04-09 IMAGING — CT CT HEAD W/O CM
4 series · 16 of 47 positions shown, 18 images · non-contrast
Comparison: None.

CLINICAL DATA: Stroke follow-up

EXAM:
CT HEAD WITHOUT CONTRAST
TECHNIQUE: Contiguous axial images were obtained from the base of the skull
through the vertex without intravenous contrast.

[Series 3: head wo · axial · 0.40mm/px · z∈[-154,-34]mm · 7 of 34 slices shown, 9 images]
[im 5/34  brain]
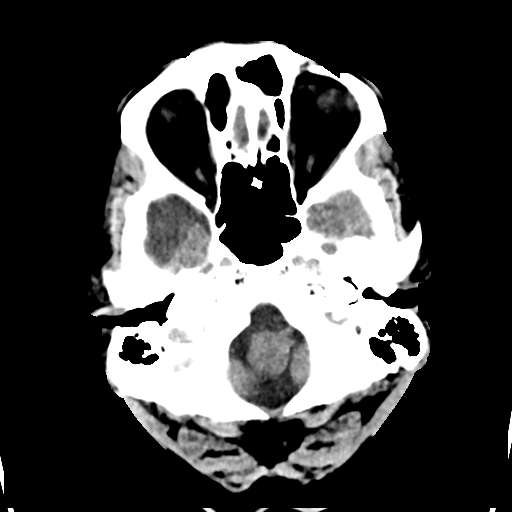
[im 5/34  bone]
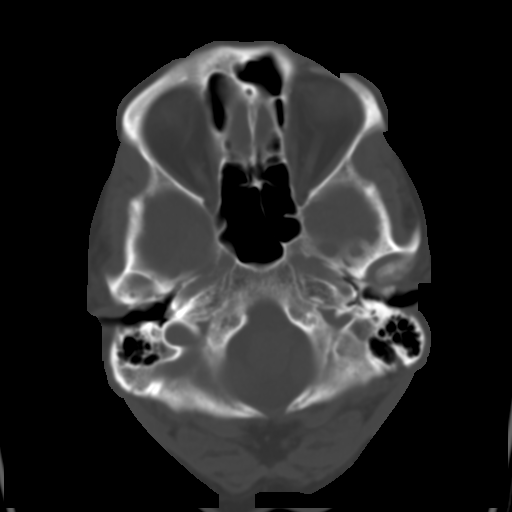
[im 9/34  brain]
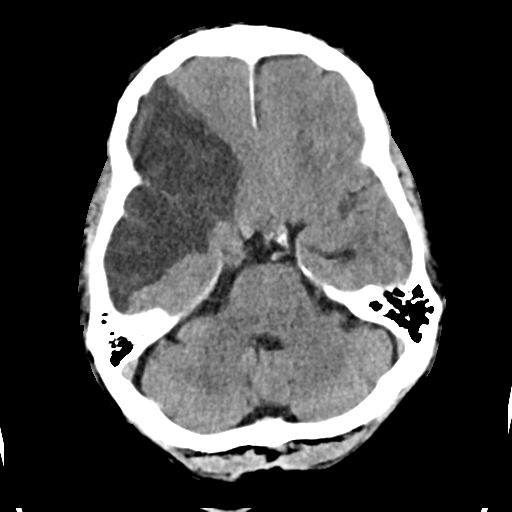
[im 13/34  brain]
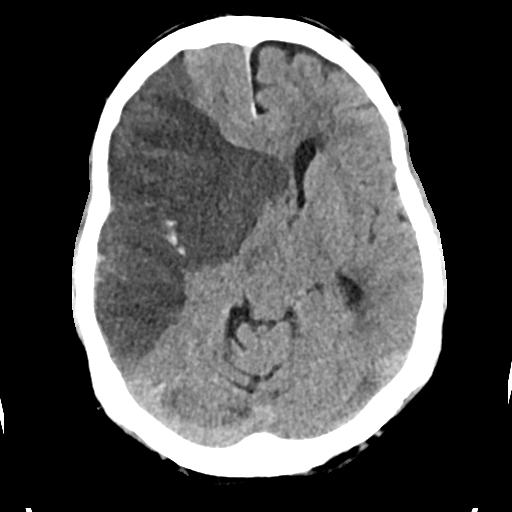
[im 17/34  brain]
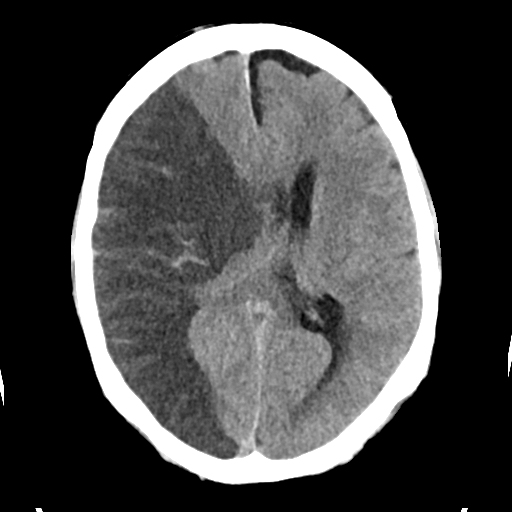
[im 21/34  brain]
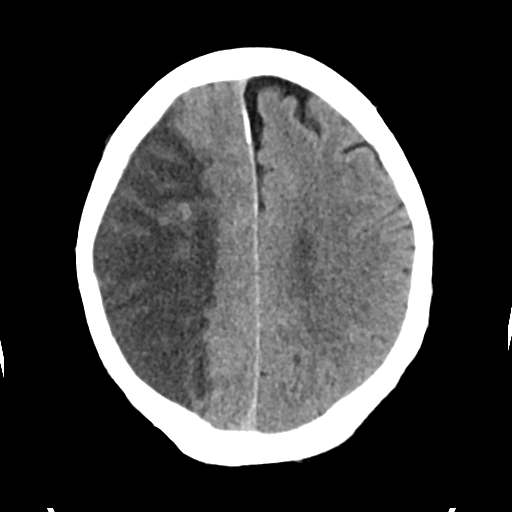
[im 21/34  bone]
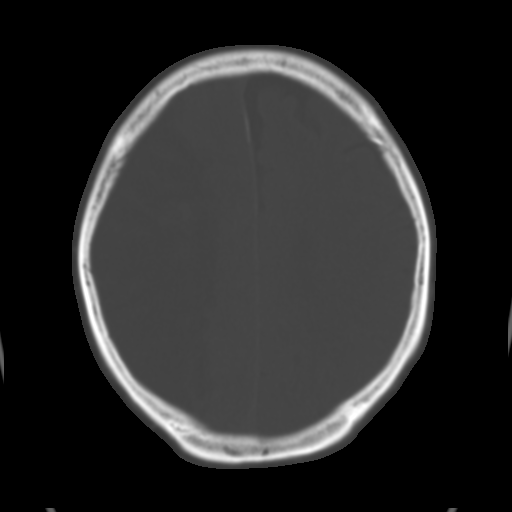
[im 25/34  brain]
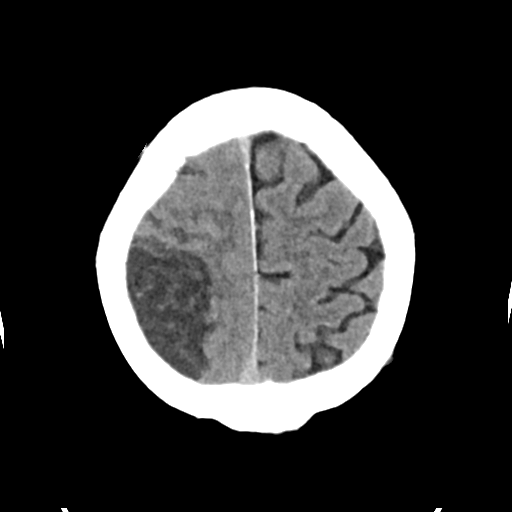
[im 29/34  brain]
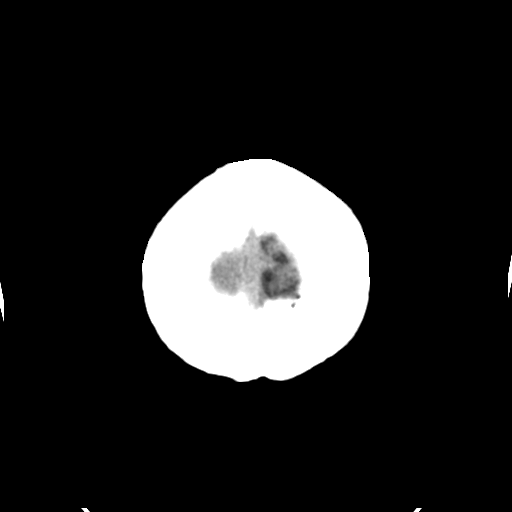

[Series 4: head bone · axial · 0.40mm/px · z∈[-158,-126]mm · 3 of 84 slices shown]
[im 9/84  bone]
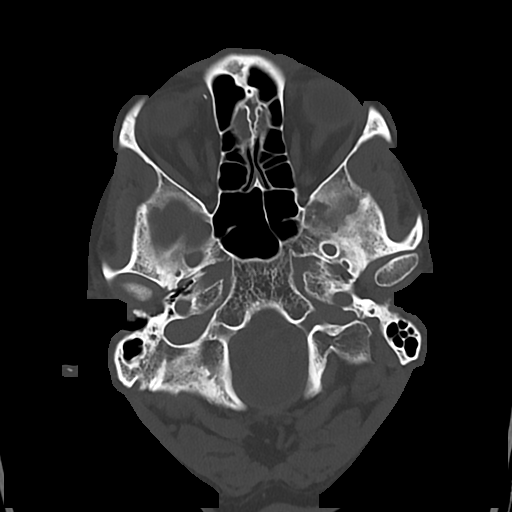
[im 17/84  bone]
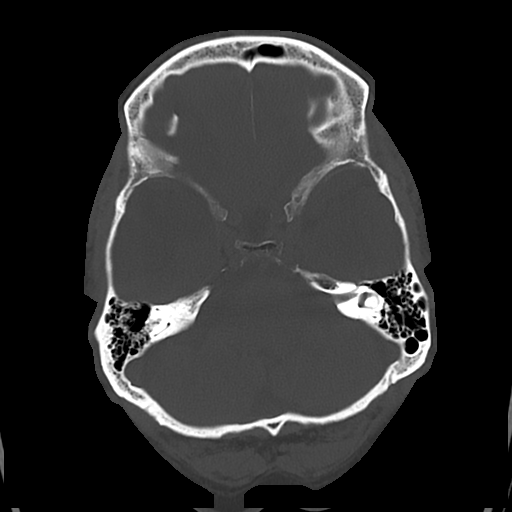
[im 25/84  bone]
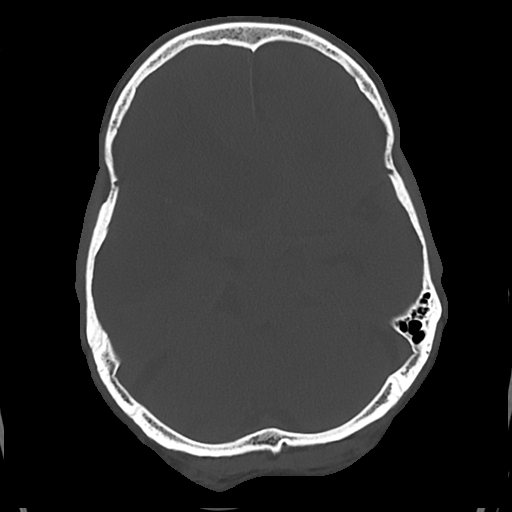

[Series 5: cor soft · coronal · 0.33mm/px · 3 of 67 slices shown]
[im 23/67  brain]
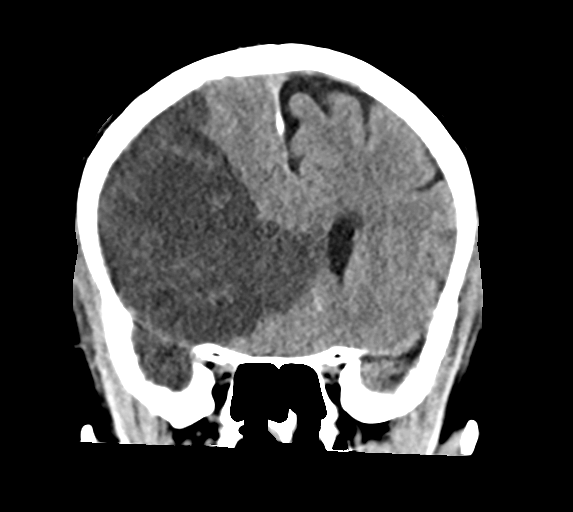
[im 30/67  brain]
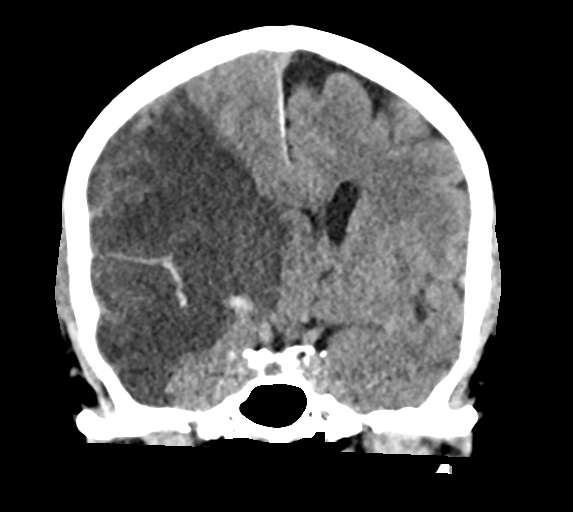
[im 37/67  brain]
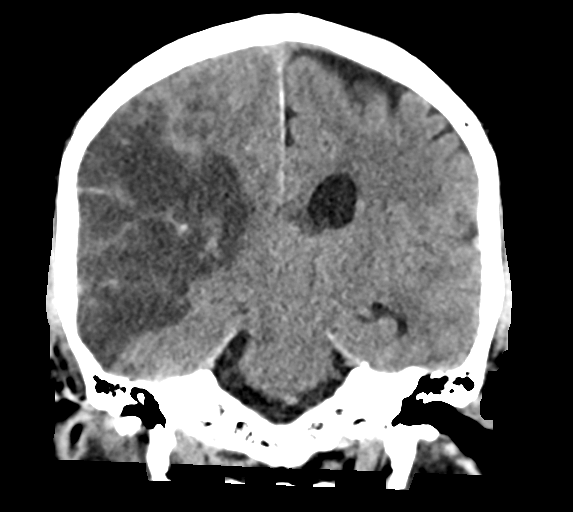

[Series 6: sag soft · sagittal · 0.33mm/px · 3 of 61 slices shown]
[im 21/61  brain]
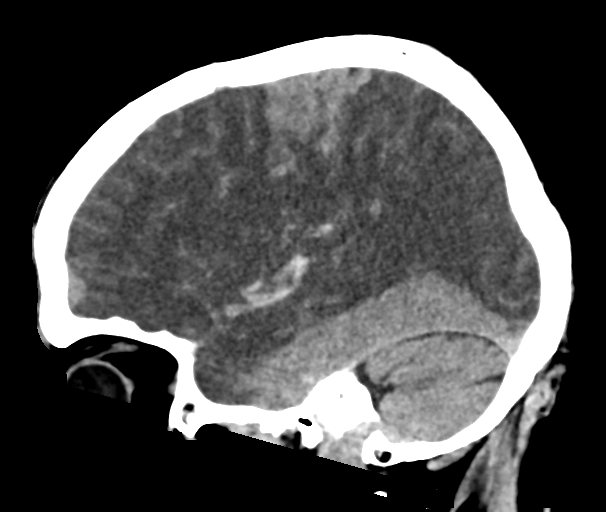
[im 31/61  brain]
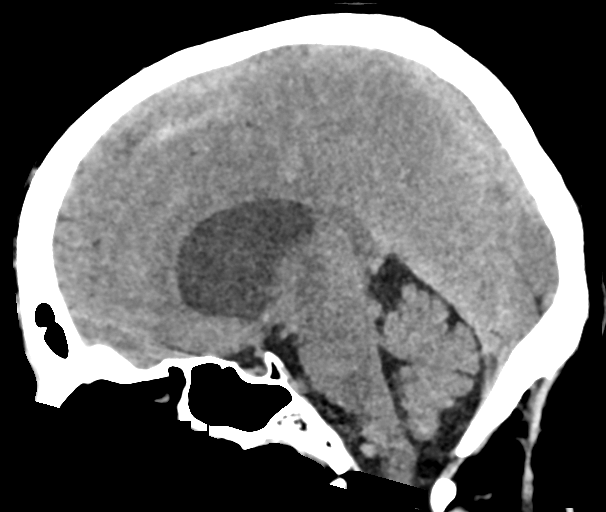
[im 41/61  brain]
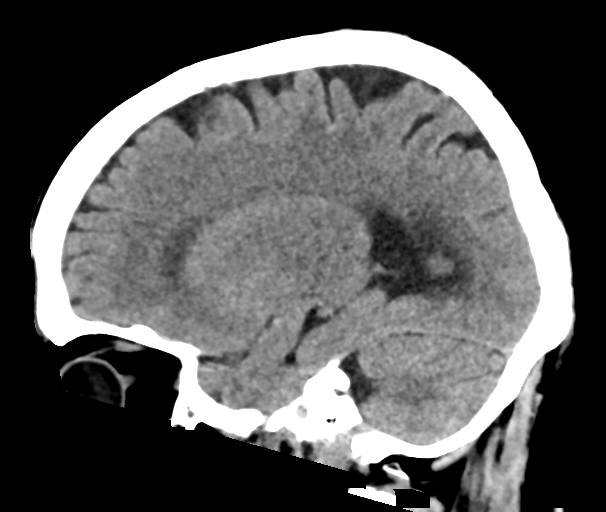

[16 of 47 positions shown; findings below may reference images not displayed]

FINDINGS: Brain: Leftward midline shift is worsened 1.4 cm. Progression of
subfalcine herniation. Areas of hyperdensity within the large region
hypoattenuation within the right hemisphere have increased slightly.
Favored to be cortical rather than subarachnoid. Unchanged right
uncal herniation.

Vascular: No abnormal hyperdensity of the major intracranial
arteries or dural venous sinuses. No intracranial atherosclerosis.

Skull: The visualized skull base, calvarium and extracranial soft
tissues are normal.

Sinuses/Orbits: No fluid levels or advanced mucosal thickening of
the visualized paranasal sinuses. No mastoid or middle ear effusion.
The orbits are normal.
IMPRESSION: 1. Worsening leftward midline shift and subfalcine herniation.
2. Unchanged right uncal herniation.
3. Increased cortical hyperdensity in the right MCA territory
favored to represent early laminar necrosis or cortical venous
distention.

## 2020-04-09 MED ORDER — FREE WATER
200.0000 mL | Freq: Four times a day (QID) | Status: DC
  Administered 2020-04-09 – 2020-04-18 (×32): 200 mL

## 2020-04-09 MED ORDER — SENNOSIDES-DOCUSATE SODIUM 8.6-50 MG PO TABS
2.0000 | ORAL_TABLET | Freq: Two times a day (BID) | ORAL | Status: DC
  Administered 2020-04-09 – 2020-04-25 (×33): 2
  Filled 2020-04-09 (×33): qty 2

## 2020-04-09 MED ORDER — FREE WATER: 200.0000 mL | Status: DC

## 2020-04-09 NOTE — Progress Notes (Signed)
NAME:  Gregory Osborn, MRN:  [id number], DOB:  [birth year], LOS: 7 ADMISSION DATE:  04/02/2020, CONSULTATION DATE:  04/03/2020 REFERRING MD:  Gregory Osborn, CHIEF COMPLAINT:  R M1 occlusion   Brief History:  75 year old male who presented to [facility] with L hemiplegia, L facial droop, slurred speech after fall. 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 TPA not given due to hematuria history. Emergently transferred to [facility] IR for thrombectomy (unsuccessful).  History of Present Illness:  Gregory Osborn is a 75 year old male with history of HTN (recently started on lisinopril), BPH, urinary retention with frequent UTIs (recently utilizing an indwelling Foley catheter at home, followed by Urology) and hematuria (likely 2/2 traumatic catheterization). Per family he was doing well overall 2/16 until 1125 when he was reportedly had a fall at home. Family found him with new onset of L-sided paralysis, L facial droop and slurred speech and called EMS. He was taken to [facility] for evaluation and diagnosed with R M1 occlusion. No TPA was administered, given patient's recent report of hematuria. He was emergently transferred to [facility] for IR thrombectomy/recanalization.   Per IR procedure note, temporary recanalization was achieved multiple times but resulted in subsequent reocclusion (8 pass attempts made). Post-procedure, patient was transferred to 4N. PCCM was consulted 2/17 in the setting of expected post-stroke edema for possible need for reintubation and ventilatory support.  Today, patient reports ongoing LUE/LLE weakness and chest discomfort (reproducible). Denies recent fever/chills, SOB/dyspnea, n/v/d, sick contacts. He has had some urinary symptoms (hesitancy, hematuria) as noted above for which he is well-established with urology.  Past Medical History:  HTN, urinary retention with frequent UTIs, BPH, hematuria  Significant Hospital Events:  2/16 Presented to [facility] with L hemiplegia/facial droop, LKW 2/16 1125. Dx with  R M1 occlusion, transferred to [facility], IR attempted thrombectomy (unsuccessful)  Consults:  PCCM  Procedures:  ETT 2/16  Significant Diagnostic Tests:   CT Head 2/16 >> complete R MCA territory infarction with at least partial involvement of the basal ganglia, areas of hyperdensity in the deep insula/frontoparietal junction could represent a combination of contrast staining and small amount of hemorrhage, no large confluent hematoma, mild swelling with early mass effect, R to L shift of 83mm  IR Percutaneous Art Thrombectomy 2/16  Micro Data:  2/16 COVID >> negative  Antimicrobials:  N/A  Interim History / Subjective:  Hypernatremia overnight. Improving mental status. No BM in days.   Objective   Blood pressure 138/74, pulse 73, temperature 97.8 F (36.6 C), temperature source Axillary, resp. rate 16, height 5\' 5"  (1.651 m), weight 76.3 kg, SpO2 98 %.        Intake/Output Summary (Last 24 hours) at 04/09/2020 1326 Last data filed at 04/09/2020 0900 Gross per 24 hour  Intake 1300 ml  Output 1200 ml  Net 100 ml   Filed Weights   04/02/20 1519 04/02/20 1730  Weight: 77.1 kg 76.3 kg   Examination: General: Chronically ill appearing. .Mild left sided facial droop. Awake and responsive in NAD HEENT: Atraumatic. PERRL.  Neuro: Interactive and Alert. Moves RUE and RLE to command. Lacks movement on LUE and LLE CV: Regular rate and rhythm. 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  L hemiplegia/ S/p attempted IR thrombectomy/recanalization Small right SAH - Continue management per Neurology  - AEDs per Neurology  - Aspiration precautions - Seizure  precautions - Continue Neuro checks for improvement  Cerebral edema with AMS- Improving - Hold hypertonic saline due to hypernatremia  - Continue Neuro checks  - Continue Depakote for HA  Hypertensive urgency- Improved - Continue monitoring  Urinary retention complicated by frequent UTI BPH with chronic urinary retention - Maintain indwelling Foley cath  - Continue monitoring urine output   At risk malnutrition  - Replacing Cortrak  - Place back on TF and give free water for Hypernatremia   Constipation - Continue bowel regimen  - Add PRN Dulcolax  - Consider KUB if pt not improving      Daily Goals Checklist  Pain/Anxiety/Delirium protocol (if indicated): none Neuro vitals: every 1 hours AED's: valproate VAP protocol (if indicated): not intubated Respiratory support goals: aspiration precautions.  Blood pressure target: SBP 120-160. Adding prn labetalol DVT prophylaxis: heparin tid Nutrition Status: on hold given mental status. Cortrak tomorrow if mental status not improved.  GI prophylaxis: not indicated.  Fluid status goals: allowing autoregulation Urinary catheter: external catheter.  Central lines: PIV only Glucose control: euglycemic on no treatment Mobility/therapy needs: bedrest Antibiotic de-escalation: none Home medication reconciliation: on hold Daily labs: sodium q6h while on hypertonic saline.  Code Status: full code  Family Communication: updated yesterday by Dr Gregory Osborn Disposition: ICU.  Goals of Care:  Last date of multidisciplinary goals of care discussion: With Neurology 2/19 Family and staff present: son and wife Summary of discussion: per Neurology Follow up goals of care discussion due: 2/23 Code Status: Full  Labs   CBC: Recent Labs  Lab 04/02/20 2208 04/05/20 0304 04/06/20 0352 04/07/20 0320 04/08/20 0524 04/09/20 0423  WBC 14.8* 14.2* 10.3 9.3 9.4 12.9*  NEUTROABS 14.0*  --   --   --   --   --   HGB 14.9 13.7 14.5 14.8 14.5 14.6   HCT 42.4 39.8 43.6 45.1 44.5 43.8  MCV 84.8 85.6 86.5 87.4 89.5 88.8  PLT 244 211 243 256 256 261    Basic Metabolic Panel: Recent Labs  Lab 04/07/20 0320 04/07/20 1224 04/07/20 1356 04/07/20 1536 04/07/20 2202 04/08/20 0524 04/08/20 1102 04/08/20 1558 04/08/20 2141 04/09/20 0423 04/09/20 1052  NA 160* 156* 156*   < > 154*   < > 160* 160* 159* 160* 160*  K 2.6* 5.2* 3.0*  --  4.5  --  3.3*  --   --  3.8  --   CL 122* 124* 120*  --  120*  --  125*  --   --  124*  --   CO2 22 23 24   --  17*  --  25  --   --  22  --   GLUCOSE 115* 107* 114*  --  112*  --  117*  --   --  140*  --   BUN 12 14 16   --  58*  --  34*  --   --  35*  --   CREATININE 0.76 0.84 0.91  --  2.08*  --  1.20  --   --  0.98  --   CALCIUM 10.0 9.2 9.5  --  8.6*  --  9.5  --   --  9.1  --   MG 2.3  --   --   --   --   --  2.7*  --   --   --   --    < > =  values in this interval not displayed.   GFR: Estimated Creatinine Clearance: 65 mL/min (by C-G formula based on SCr of 0.98 mg/dL). Recent Labs  Lab 04/06/20 0352 04/07/20 0320 04/08/20 0524 04/09/20 0423  WBC 10.3 9.3 9.4 12.9*    Liver Function Tests: Recent Labs  Lab 04/02/20 2208 04/09/20 0423  AST 29 90*  ALT 18 160*  ALKPHOS 86 101  BILITOT 0.8 0.9  PROT 6.5 6.2*  ALBUMIN 3.4* 2.9*   No results for input(s): LIPASE, AMYLASE in the last 168 hours. No results for input(s): AMMONIA in the last 168 hours.  ABG No results found for: PHART, PCO2ART, PO2ART, HCO3, TCO2, ACIDBASEDEF, O2SAT   Coagulation Profile: Recent Labs  Lab 04/02/20 2208  INR 1.0    Cardiac Enzymes: No results for input(s): CKTOTAL, CKMB, CKMBINDEX, TROPONINI in the last 168 hours.  HbA1C: Hgb A1c MFr Bld  Date/Time Value Ref Range Status  04/03/2020 10:05 AM 5.6 4.8 - 5.6 % Final    Comment:    (NOTE) Pre diabetes:          5.7%-6.4%  Diabetes:              >6.4%  Glycemic control for   <7.0% adults with diabetes     CBG: Recent Labs  Lab  04/02/20 2006 04/05/20 1630  GLUCAP 176* 92    CRITICAL CARE Performed by: Andreas Newport  Total critical care time: minutes  Andreas Newport, MS4

## 2020-04-09 NOTE — Progress Notes (Signed)
Physical Therapy Treatment Patient Details Name: Gregory Osborn MRN: [id number] DOB: [birth year] Today's Date: 04/09/2020    History of Present Illness 75 yo male admmitted to [facility] ED on 2/16 with L hemiplegia and facial drop, imaging reveals R M1 occlusion with attempted thrombectomy (unsuccessful). Small R SAH, likely due to hemorrhagic conversion. ETT 2/16-present. PMH includes HTN, urinary retention with frequent UTIs, BPH, hematuria.    PT Comments    Pt resting upon PT arrival to room s/p SLP session, but wakes with verbal stimulation. Pt with continued LUE/LE flaccidity, with edema noted today. PT instructed pt's son in PROM techniques to L fingers, wrist, elbow, hip, knee, and ankle to maintain ROM and promote circulation; PT discouraged any ROM of L shoulder given subluxation. Pt sat EOB with mod-max PT assist x15 minutes, demonstrating truncal fatigue after this time. PT continuing to recommend SNF level of care post-acutely.     Follow Up Recommendations  SNF     Equipment Recommendations  Rolling walker with 5" wheels;Wheelchair (measurements PT);Wheelchair cushion (measurements PT);Hospital bed    Recommendations for Other Services       Precautions / Restrictions Precautions Precautions: Fall Precaution Comments: L hemiplegia, L inattention Restrictions Weight Bearing Restrictions: No    Mobility  Bed Mobility Overal bed mobility: Needs Assistance Bed Mobility: Rolling;Supine to Sit;Sit to Supine Rolling: Max assist;+2 for safety/equipment   Supine to sit: Total assist;+2 for physical assistance Sit to supine: Total assist;+2 for physical assistance   General bed mobility comments: total +2 for trunk and LE management, scooting to and from EOB with use of bed pad, and boost up in bed upon return to supine.    Transfers                    Ambulation/Gait                 Stairs             Wheelchair Mobility    Modified Rankin  (Stroke Patients Only) Modified Rankin (Stroke Patients Only) Pre-Morbid Rankin Score: No symptoms Modified Rankin: Severe disability     Balance Overall balance assessment: Needs assistance Sitting-balance support: No upper extremity supported;Feet supported Sitting balance-Leahy Scale: Zero Sitting balance - Comments: mod-max posterior assist to maintain upright, RUE pushing towards L requiring PT to place RUE in pt's lap multiple times Postural control: Posterior lean;Left lateral lean                                  Cognition Arousal/Alertness: Lethargic (drowsy s/p SLP visit) Behavior During Therapy: WFL for tasks assessed/performed;Flat affect Overall Cognitive Status: Impaired/Different from baseline Area of Impairment: Attention;Following commands                   Current Attention Level: Sustained   Following Commands: Follows one step commands consistently Safety/Judgement: Decreased awareness of deficits;Decreased awareness of safety Awareness: Intellectual Problem Solving: Slow processing;Decreased initiation;Difficulty sequencing;Requires tactile cues;Requires verbal cues General Comments: Pt with difficulty terminating tasks once instructed, i.e. "kick your leg" and keeps kicking beyond when you instruct pt to stop. multimodal cuing to get pt to attend to L, eyes crossing midline to L x1 with much effort. RUE pushing behavior      Exercises General Exercises - Upper Extremity Elbow Flexion: PROM;Left;10 reps;Supine Elbow Extension: PROM;Left;10 reps;Supine Wrist Flexion: PROM;Left;10 reps;Supine Wrist Extension: PROM;Left;10 reps;Supine Composite Extension: PROM;Left;10 reps;Supine General Exercises -  Lower Extremity Ankle Circles/Pumps: PROM;Left;5 reps;Supine Long Arc Quad: PROM;Left;10 reps;Seated Hip ABduction/ADduction: PROM;Left;5 reps;Supine Other Exercises Other Exercises: Seated balance interventions: L and R elbow propping with  return to midline, thoracic extension, anterior pelvic tilt with posterior facilitation    General Comments General comments (skin integrity, edema, etc.): vss      Pertinent Vitals/Pain Pain Assessment: Faces Faces Pain Scale: No hurt Pain Intervention(s): Limited activity within patient's tolerance;Monitored during session    Home Living                      Prior Function            PT Goals (current goals can now be found in the care plan section) Acute Rehab PT Goals Patient Stated Goal: none stated PT Goal Formulation: Patient unable to participate in goal setting Time For Goal Achievement: 04/21/20 Potential to Achieve Goals: Fair Progress towards PT goals: Progressing toward goals    Frequency    Min 3X/week      PT Plan Current plan remains appropriate    Co-evaluation              AM-PAC PT "6 Clicks" Mobility   Outcome Measure  Help needed turning from your back to your side while in a flat bed without using bedrails?: Total Help needed moving from lying on your back to sitting on the side of a flat bed without using bedrails?: Total Help needed moving to and from a bed to a chair (including a wheelchair)?: Total Help needed standing up from a chair using your arms (e.g., wheelchair or bedside chair)?: Total Help needed to walk in hospital room?: Total Help needed climbing 3-5 steps with a railing? : Total 6 Click Score: 6    End of Session   Activity Tolerance: Patient limited by fatigue Patient left: in bed;with call bell/phone within reach;with bed alarm set;with SCD's reapplied;with restraints reapplied (R wrist restraint, R mitt) Nurse Communication: Mobility status PT Visit Diagnosis: Other abnormalities of gait and mobility (R26.89);Hemiplegia and hemiparesis Hemiplegia - Right/Left: Left Hemiplegia - dominant/non-dominant: Dominant Hemiplegia - caused by: Cerebral infarction     Time: 8416-6063 PT Time Calculation (min)  (ACUTE ONLY): 31 min  Charges:  $Therapeutic Activity: 8-22 mins $Neuromuscular Re-education: 8-22 mins                     Marye Round, PT Acute Rehabilitation Services Pager [phone]  Office [phone]    Truddie Coco 04/09/2020, 5:39 PM

## 2020-04-09 NOTE — Progress Notes (Signed)
STROKE TEAM PROGRESS NOTE   INTERVAL HISTORY Son is at the beside. Pt lethargic this morning, but slightly more awake than yesterday. Able to follow midline commands but not peripheral commends but able to pantomime. Still has left hemiplegia, right gaze. No fever. Still NPO, pulled off cortrak in am but will replace it today. Na 160, will allow trending down slowly.   Vitals:   04/09/20 1611 04/09/20 1700 04/09/20 1800 04/09/20 1900  BP: (!) 144/66 (!) 119/57 121/61 (!) 149/74  Pulse: 86 77 90 99  Resp: 15 18 20 17   Temp:      TempSrc:      SpO2: 100% 100% 100%   Weight:      Height:       CBC:  Recent Labs  Lab 04/02/20 2208 04/05/20 0304 04/08/20 0524 04/09/20 0423  WBC 14.8*   < > 9.4 12.9*  NEUTROABS 14.0*  --   --   --   HGB 14.9   < > 14.5 14.6  HCT 42.4   < > 44.5 43.8  MCV 84.8   < > 89.5 88.8  PLT 244   < > 256 261   < > = values in this interval not displayed.   Basic Metabolic Panel:  Recent Labs  Lab 04/07/20 0320 04/07/20 1224 04/08/20 1102 04/08/20 1558 04/09/20 0423 04/09/20 1052 04/09/20 1651  NA 160*   < > 160*   < > 160* 160* 157*  K 2.6*   < > 3.3*  --  3.8  --   --   CL 122*   < > 125*  --  124*  --   --   CO2 22   < > 25  --  22  --   --   GLUCOSE 115*   < > 117*  --  140*  --   --   BUN 12   < > 34*  --  35*  --   --   CREATININE 0.76   < > 1.20  --  0.98  --   --   CALCIUM 10.0   < > 9.5  --  9.1  --   --   MG 2.3  --  2.7*  --   --   --   --    < > = values in this interval not displayed.   Lipid Panel:  Recent Labs  Lab 04/03/20 1005  CHOL 197  TRIG 109  HDL 57  CHOLHDL 3.5  VLDL 22  LDLCALC 04/05/20*   HgbA1c:  Recent Labs  Lab 04/03/20 1005  HGBA1C 5.6   Urine Drug Screen:  No results for input(s): LABOPIA, COCAINSCRNUR, LABBENZ, AMPHETMU, THCU, LABBARB in the last 168 hours.  Alcohol Level  Recent Labs  Lab 04/02/20 2208  ETH <10    IMAGING past 24 hours CT HEAD WO CONTRAST  Result Date: 04/09/2020 CLINICAL DATA:   Stroke follow-up EXAM: CT HEAD WITHOUT CONTRAST TECHNIQUE: Contiguous axial images were obtained from the base of the skull through the vertex without intravenous contrast. COMPARISON:  None. FINDINGS: Brain: Leftward midline shift is worsened 1.4 cm. Progression of subfalcine herniation. Areas of hyperdensity within the large region hypoattenuation within the right hemisphere have increased slightly. Favored to be cortical rather than subarachnoid. Unchanged right uncal herniation. Vascular: No abnormal hyperdensity of the major intracranial arteries or dural venous sinuses. No intracranial atherosclerosis. Skull: The visualized skull base, calvarium and extracranial soft tissues are normal. Sinuses/Orbits: No fluid levels  or advanced mucosal thickening of the visualized paranasal sinuses. No mastoid or middle ear effusion. The orbits are normal. IMPRESSION: 1. Worsening leftward midline shift and subfalcine herniation. 2. Unchanged right uncal herniation. 3. Increased cortical hyperdensity in the right MCA territory favored to represent early laminar necrosis or cortical venous distention. Electronically Signed   By: Deatra Robinson M.D.   On: 04/09/2020 03:03   PHYSICAL EXAM  Temp:  [97.4 F (36.3 C)-99.8 F (37.7 C)] 98.1 F (36.7 C) (02/23 1600) Pulse Rate:  [73-241] 99 (02/23 1900) Resp:  [10-26] 17 (02/23 1900) BP: (115-159)/(57-85) 149/74 (02/23 1900) SpO2:  [98 %-100 %] 100 % (02/23 1800)  General - Well nourished, well developed elderly Hispanic male, in no apparent distress.   Ophthalmologic - fundi not visualized due to noncooperation.  Neuro - Pt lethargic eyes open, orientated to place, people, but not to situation, time or age. Not cooperative with name and repeat and follow central commands but not peripheral commands, able to pantomine. Left visual neglect vs. Hemianopia. Right gaze, not able to cross midline. Right pupil 43mm and left 53mm, sluggish to light. Left facial droop. Left  UE and LE mild withdraw to pain. RUE and RLE spontaneous movement against gravity. Sensation, coordination not cooperative and gait not tested.   ASSESSMENT/PLAN Gregory Osborn is a 75 y.o. male with a history of recent urologic issues including UTI (01/22), urinary retention with foley cath placed, hematuria (likely traumatic due to self cath) s/p cystoscopy 04/01/20 by Dr. Pete Glatter [facility])  showing BPH with recommended surgical treatment. Surgical history includes appendectomy.He had been doing well up until 11:25am when he was noted to develop left hemiplegia. EMS was contacted, and took him to the [facility] ED where stroke workup was undertaken. He was noted to have left hemiplegia and neglect. NIHSS score 15.  He was not given tPA due to the hematuria. Transferred urgently to [facility] for thrombectomy which was aborted after multiple attempts which were unsuccessful.   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    MRI showed large right MCA infarct involving entire right MCA, consistent with malignant right MCA syndrome.  Small right SAH likely due to hemorrhagic conversion.    MRA head showed right M1 occluded.   CT repeat 2/23 right MCA large infarct with MLS 47mm  2D Echo EF 60-65%, No shunt   LDL 118  HgbA1c 5.6  VTE prophylaxis - heparin subq  No antithrombotics PTA, now on ASA 325mg   Therapy recommendations:  SNF  Disposition:  pending   Cerebral Edema  Critical care medicine team consulted.  Repeat HCT 2/19 shows worsening shift and edema, MLS at 91mm  CT repeat 2/23 right MCA large infarct with MLS 35mm  Na goal 155-160  Na check Q6h  On 3% saline -> NS -> off  Na 154->158->160->157  Neurosurgery, Dr. 13m, consulted and advised patient is  not a candidate for surgical intervention at this time.   On Depakote for headache management.    Hypertensive urgency  Home meds:  Lisinopril 5mg  daily   BP stable now  Off cleviprex  On amlodipine 10  Off lisinopril due to AKI . BP goal < 160 given hemorrhagic conversion . Long-term BP goal normotensive  HLD   no statin prior to admission  LDL 118, goal < 70  On lipitor 40  Continue statin on discharge  Dysphagia   NPO after mental decline  Off IVF  Speech on board  On TF and FW   Fever AKI  Urinary retention d/t BPH present on admission  Tmax 100.6 -> afebrile  Cre 0.93->2.08->1.20->0.98  Developed urinary retention and UTI about a month ago necessitating trial of intermittent self cath (failed) and then subsequent foley placement  S/p cystoscopy 2/15 with recommended surgical intervention for BPH [facility]  Hematuria appears transient after attempts at self cath per urology notes review. Hematuria not present on on UA yesterday.   Continue foley catheter, Monitor for hematuria. No signs of active bleeding. hgb stable. Foley output remains clear yellow.   UA WBC 11-20  Urine and blood culture pending  CXR neg  Other Stroke Risk Factors  ETOH use, alcohol level <10, family reports he drinks regularly but not a problem.   Other Active Problems    Hospital day # 7  This patient is critically ill due to right large MCA infarct with cerebral edema, fever, UTI, dysphagia and at significant risk of neurological worsening, death form brain herniation, recurrent stroke, seizure, hemorrhagic conversion. This patient's care requires constant monitoring of vital signs, hemodynamics, respiratory and cardiac monitoring, review of multiple databases, neurological assessment, discussion with family, other specialists and medical decision making of high complexity. I spent 35 minutes of neurocritical care time in the care of this patient. I had  long discussion with son at bedside, updated pt current condition, treatment plan and potential prognosis, and answered all the questions. He expressed understanding and appreciation.    Marvel Plan, MD PhD Stroke Neurology 04/09/2020 7:42 PM    To contact Stroke Continuity provider, please refer to [email or web address]. After hours, contact General Neurology

## 2020-04-09 NOTE — Procedures (Signed)
Cortrak  Person Inserting Tube:  King, Leanne E, RD Tube Type:  Cortrak - 43 inches Tube Location:  Right nare Initial Placement:  Stomach Secured by: Bridle Technique Used to Measure Tube Placement:  Documented cm marking at nare/ corner of mouth Cortrak Secured At:  69 cm    Cortrak Tube Team Note:  Consult received to place a Cortrak feeding tube.   No x-ray is required. RN may begin using tube.   If the tube becomes dislodged please keep the tube and contact the Cortrak team at [email or web address] ([email or web address]) for replacement.  If after hours and replacement cannot be delayed, place a NG tube and confirm placement with an abdominal x-ray.    Leanne King, MS, RD, LDN Pager number available on Amion 

## 2020-04-09 NOTE — Progress Notes (Signed)
Patient removed cortrack earlier today, despite use of mitten. Family at bedside when new cortrack placed and unable to redirect patient from removing it. Notified Dr. Denese Killings and received order for a right wrist restraint.

## 2020-04-09 NOTE — Progress Notes (Signed)
  Speech Language Pathology Treatment: Dysphagia  Patient Details Name: Amandeep Nesmith MRN: [id number] DOB: Oct 09, 1945 Today's Date: 04/09/2020 Time: 9323-5573 SLP Time Calculation (min) (ACUTE ONLY): 20 min  Assessment / Plan / Recommendation Clinical Impression  Pt was sleepy but was able to be awakened for session. Son at bedside. Pt oriented to place, month. 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  Kept eyes opened, strong right gaze preference persists.  Requested applesauce when offered.  He ate nearly four oz with verbal/tactile cues needed for effortful swallow and lingual sweep on left.  Oral suctioning provided intermittently to remove residue left cheek. Attentive to PO and more interactive than when last seen on 2/21.  Continue purees from floor stock; SLP f/u to determine readiness to advance diet again; cognitive tx.    HPI HPI: Kayler Rise is a 75 y.o. male with a history of UTI, urinary retention, hematuria, and appendectomy. He was seen by his urologist yesterday, and was doing well. Plans were made to undergo ultrasound, and to treat for UTI. He has been doing well up until 11:25am when he was noted to develop left hemiplegia. EMS was contacted, and took him to the hospital ED where stroke workup was undertaken. He was noted to have left hemiplegia and neglect. He was not given tPA due to the hematuria. Transferred urgently for thrombectomy. 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 2/19 presented with clinical decline, somnolence, not following commands. CT head showed increase midline shift to 12 mm and edema.  2/21 with some improved mentation.      SLP Plan  Continue with current plan of care       Recommendations  Diet recommendations: NPO (allow purees from floorstock) Medication Administration: Via alternative means (or crush with  puree) Supervision: Staff to assist with self feeding Compensations: Lingual sweep for clearance of pocketing;Minimize environmental distractions (suction oral cavity after purees) Postural Changes and/or Swallow Maneuvers: Seated upright 90 degrees                Oral Care Recommendations: Oral care QID Follow up Recommendations: Inpatient Rehab SLP Visit Diagnosis: Dysphagia, oral phase (R13.11) Plan: Continue with current plan of care       GO                Blenda Mounts Laurice 04/09/2020, 4:09 PM  Ginelle Bays L. Samson Frederic, MA CCC/SLP Acute Rehabilitation Services Office number [phone] Pager [phone]

## 2020-04-10 DIAGNOSIS — I63511 Cerebral infarction due to unspecified occlusion or stenosis of right middle cerebral artery: Secondary | ICD-10-CM | POA: Diagnosis not present

## 2020-04-10 DIAGNOSIS — G936 Cerebral edema: Secondary | ICD-10-CM | POA: Diagnosis not present

## 2020-04-10 DIAGNOSIS — E876 Hypokalemia: Secondary | ICD-10-CM | POA: Diagnosis not present

## 2020-04-10 DIAGNOSIS — R1312 Dysphagia, oropharyngeal phase: Secondary | ICD-10-CM | POA: Diagnosis not present

## 2020-04-10 LAB — BASIC METABOLIC PANEL
Anion gap: 10 (ref 5–15)
BUN: 27 mg/dL — ABNORMAL HIGH (ref 8–23)
CO2: 26 mmol/L (ref 22–32)
Calcium: 8.5 mg/dL — ABNORMAL LOW (ref 8.9–10.3)
Chloride: 118 mmol/L — ABNORMAL HIGH (ref 98–111)
Creatinine, Ser: 0.85 mg/dL (ref 0.61–1.24)
GFR, Estimated: >60 mL/min (ref >60)
Glucose, Bld: 134 mg/dL — ABNORMAL HIGH (ref 70–99)
Potassium: 3.4 mmol/L — ABNORMAL LOW (ref 3.5–5.1)
Sodium: 154 mmol/L — ABNORMAL HIGH (ref 135–145)

## 2020-04-10 LAB — CBC
HCT: 41.5 % (ref 39.0–52.0)
Hemoglobin: 13 g/dL (ref 13.0–17.0)
MCH: 28.5 pg (ref 26.0–34.0)
MCHC: 31.3 g/dL (ref 30.0–36.0)
MCV: 91 fL (ref 80.0–100.0)
Platelets: 220 10*3/uL (ref 150–400)
RBC: 4.56 MIL/uL (ref 4.22–5.81)
RDW: 13.7 % (ref 11.5–15.5)
WBC: 12.8 10*3/uL — ABNORMAL HIGH (ref 4.0–10.5)
nRBC: 0 % (ref 0.0–0.2)

## 2020-04-10 LAB — CULTURE, BLOOD (ROUTINE X 2)

## 2020-04-10 LAB — GLUCOSE, CAPILLARY
Glucose-Capillary: 121 mg/dL — ABNORMAL HIGH (ref 70–99)
Glucose-Capillary: 122 mg/dL — ABNORMAL HIGH (ref 70–99)
Glucose-Capillary: 128 mg/dL — ABNORMAL HIGH (ref 70–99)
Glucose-Capillary: 119 mg/dL — ABNORMAL HIGH (ref 70–99)

## 2020-04-10 LAB — URINE CULTURE: Culture: NO GROWTH

## 2020-04-10 MED ORDER — POTASSIUM CHLORIDE 20 MEQ PO PACK
40.0000 meq | PACK | Freq: Once | ORAL | Status: AC
  Administered 2020-04-10: 40 meq via ORAL
  Filled 2020-04-10: qty 2

## 2020-04-10 NOTE — Plan of Care (Signed)
  Problem: Education: Goal: Knowledge of General Education information will improve Description Including pain rating scale, medication(s)/side effects and non-pharmacologic comfort measures Outcome: Progressing   

## 2020-04-10 NOTE — Progress Notes (Signed)
Physical Therapy Treatment Patient Details Name: Gregory Osborn MRN: [id number] DOB: 12-23-1945 Today's Date: 04/10/2020    History of Present Illness 75 yo male admmitted to [facility] ED on 2/16 with L hemiplegia and facial drop, imaging reveals R M1 occlusion with attempted thrombectomy (unsuccessful). Small R SAH, likely due to hemorrhagic conversion. ETT 2/16-present. PMH includes HTN, urinary retention with frequent UTIs, BPH, hematuria.    PT Comments    The pt was seen for session with focus on progression of strengthening and seated stability tasks. The pt continues to require maxA to complete transition to sitting EOB, receiving assist to all extremities and to raise trunk from elevated HOB. The pt was then able to maintain sitting EOB with maxA of 1 while attempting to complete tasks to strengthen BLE and challenge balance with assist of +2. The pt was able to maintain 8-10 min of sitting EOB, before requiring return to chair position in bed where he completed additional exercises. Son present and educated on HEP for strengthening of RUE and swelling management of LUE. Continue to recommend SNF for post-acute rehab to address functional mobility deficits and reduce level of assist needed prior to return.     Follow Up Recommendations  SNF     Equipment Recommendations  Rolling walker with 5" wheels;Wheelchair (measurements PT);Wheelchair cushion (measurements PT);Hospital bed    Recommendations for Other Services       Precautions / Restrictions Precautions Precautions: Fall Precaution Comments: L hemiplegia, L inattention Restrictions Weight Bearing Restrictions: No    Mobility  Bed Mobility Overal bed mobility: Needs Assistance Bed Mobility: Rolling;Supine to Sit;Sit to Supine Rolling: Max assist;+2 for safety/equipment   Supine to sit: Total assist;+2 for physical assistance Sit to supine: Total assist;+2 for physical assistance   General bed mobility comments: total  +2 for trunk and LE management, scooting to and from EOB with use of bed pad, and boost up in bed upon return to supine.    Transfers                 General transfer comment: deferred due to assist needed and pt fatigue   Modified Rankin (Stroke Patients Only) Modified Rankin (Stroke Patients Only) Pre-Morbid Rankin Score: No symptoms Modified Rankin: Severe disability     Balance Overall balance assessment: Needs assistance Sitting-balance support: No upper extremity supported;Feet supported Sitting balance-Leahy Scale: Zero Sitting balance - Comments: mod/maxA to maintain static sitting throuhgout. pt able to tolerate down onto R elbow and push to sitting, but unable to use RUE to stabilize. Postural control: Posterior lean                                  Cognition Arousal/Alertness: Lethargic Behavior During Therapy: WFL for tasks assessed/performed;Flat affect Overall Cognitive Status: Impaired/Different from baseline Area of Impairment: Attention;Following commands                   Current Attention Level: Sustained   Following Commands: Follows one step commands inconsistently;Follows one step commands with increased time Safety/Judgement: Decreased awareness of deficits;Decreased awareness of safety   Problem Solving: Slow processing;Decreased initiation;Difficulty sequencing;Requires tactile cues;Requires verbal cues General Comments: pt with slowed and inconsistent initiation of movement when cued by PT, also difficulty terminating movements after PT started cueing new task. Pt lethargic, but able to nod yes to "do you need to lay down?"      Exercises General Exercises - Upper  Extremity Shoulder Flexion: Strengthening;Right;10 reps;Seated Elbow Flexion: Strengthening;Right;10 reps;Seated Elbow Extension: Strengthening;Right;10 reps;Seated Wrist Flexion: PROM;Left;10 reps;Supine Wrist Extension: PROM;Left;10 reps;Supine Digit  Composite Flexion: PROM;Left;5 reps Composite Extension: PROM;Left;10 reps;Supine General Exercises - Lower Extremity Ankle Circles/Pumps: AAROM;Right;PROM;Left;10 reps;Supine Long Arc Quad: AAROM;Right;PROM;Left;10 reps;Seated Heel Slides: PROM;Both;5 reps;Supine Other Exercises Other Exercises: Seated balance interventions: L and R elbow propping with return to midline, thoracic extension    General Comments General comments (skin integrity, edema, etc.): VSS on RA, pt son present and supportive. educated on positioning for reduction in LUE swelling.      Pertinent Vitals/Pain Pain Assessment: Faces Faces Pain Scale: Hurts a little bit Pain Location: L shoulder Pain Descriptors / Indicators: Discomfort Pain Intervention(s): Monitored during session;Repositioned           PT Goals (current goals can now be found in the care plan section) Acute Rehab PT Goals Patient Stated Goal: none stated PT Goal Formulation: Patient unable to participate in goal setting Time For Goal Achievement: 04/21/20 Potential to Achieve Goals: Fair Progress towards PT goals: Progressing toward goals    Frequency    Min 3X/week      PT Plan Current plan remains appropriate       AM-PAC PT "6 Clicks" Mobility   Outcome Measure  Help needed turning from your back to your side while in a flat bed without using bedrails?: Total Help needed moving from lying on your back to sitting on the side of a flat bed without using bedrails?: Total Help needed moving to and from a bed to a chair (including a wheelchair)?: Total Help needed standing up from a chair using your arms (e.g., wheelchair or bedside chair)?: Total Help needed to walk in hospital room?: Total Help needed climbing 3-5 steps with a railing? : Total 6 Click Score: 6    End of Session   Activity Tolerance: Patient limited by fatigue Patient left: in bed;with call bell/phone within reach;with bed alarm set;with restraints  reapplied Nurse Communication: Mobility status PT Visit Diagnosis: Other abnormalities of gait and mobility (R26.89);Hemiplegia and hemiparesis Hemiplegia - Right/Left: Left Hemiplegia - dominant/non-dominant: Dominant Hemiplegia - caused by: Cerebral infarction     Time: 1654-1730 PT Time Calculation (min) (ACUTE ONLY): 36 min  Charges:  $Therapeutic Activity: 8-22 mins $Neuromuscular Re-education: 8-22 mins                     Rolm Baptise, PT, DPT   Acute Rehabilitation Department Pager #: [phone]   Gaetana Michaelis 04/10/2020, 5:57 PM

## 2020-04-10 NOTE — Progress Notes (Signed)
Nurse was called into patients room by tech. When nurse came into patients rooms found patient to have pulled out cortrak despite Right wrist restraint (left LUE is flaccid) applied. Nurse assessed patient and dont suspect aspiration.   No cortrak team to replace at night. On call neurology MD paged awaiting possible new orders.    Update:MD put in one time order for Depakene to be switch IV, then rswitch back to per tube after cortrak is replaced.

## 2020-04-10 NOTE — Progress Notes (Signed)
  Speech Language Pathology Treatment: Dysphagia;Cognitive-Linquistic  Patient Details Name: Gregory Osborn MRN: [id number] DOB: 01-06-1946 Today's Date: 04/10/2020 Time: 4196-2229 SLP Time Calculation (min) (ACUTE ONLY): 25 min  Assessment / Plan / Recommendation Clinical Impression  Pt remains lethargic, could be prompted to awaken briefly for participation.  Son, Gregory Osborn, was at bedside. With max prompting, pt opened eyes and answered questions intermittently about person/place - oriented to person, family, hospital, town, year.  He accepted sips of water today with no s/s of aspiration. Today, he was able initially to sip from a straw, then could not manipulate the straw to withdraw water.  With physical assist, he used RUE to drink from edge of cup, requiring close supervision to monitor quantity and prevent spills from mouth.  He drank limited amounts (2-4 oz) before falling back to sleep. The primary is obstacle is lethargy and moderate oral dysphagia.  Gregory Osborn and I talked about the options for nutrition and that SNF rehabs won't accept patients with NG tubes.  He has already been in discussion with MD about possible PEG.  Hopefully Gregory Osborn will turn a corner and begin to take more POs in the next few days.  In the meantime, he may continue to have purees (puddings, applesauce) from floor stock and sips of thin liquid. Swallowing sign at [facility] updated.Marland Kitchen SLP will continue to follow for dysphagia/cognition.   HPI HPI: Gregory Osborn is a 76 y.o. male with a history of UTI, urinary retention, hematuria, and appendectomy. He was seen by his urologist yesterday, and was doing well. Plans were made to undergo ultrasound, and to treat for UTI. He has been doing well up until 11:25am when he was noted to develop left hemiplegia. EMS was contacted, and took him to the hospital ED where stroke workup was undertaken. He was noted to have left hemiplegia and neglect. He was not given tPA due to the hematuria.  Transferred urgently for thrombectomy. 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 2/19 presented with clinical decline, somnolence, not following commands. CT head showed increase midline shift to 12 mm and edema.  2/21 with some improved mentation.      SLP Plan  Continue with current plan of care       Recommendations  Diet recommendations: NPO (purees from floor stock, sips of thin liquid) Liquids provided via: Teaspoon;Cup;Straw Medication Administration: Via alternative means Supervision: Staff to assist with self feeding;Trained caregiver to feed patient Compensations: Lingual sweep for clearance of pocketing;Minimize environmental distractions (oral suction after purees) Postural Changes and/or Swallow Maneuvers: Seated upright 90 degrees                Oral Care Recommendations: Oral care QID Follow up Recommendations: Inpatient Rehab SLP Visit Diagnosis: Dysphagia, oral phase (R13.11) Plan: Continue with current plan of care       GO          Amanda L. Samson Frederic, MA CCC/SLP Acute Rehabilitation Services Office number [phone] Pager [phone]       Blenda Mounts Gregory Osborn 04/10/2020, 1:25 PM

## 2020-04-10 NOTE — Progress Notes (Signed)
PHARMACY - PHYSICIAN COMMUNICATION CRITICAL VALUE ALERT - BLOOD CULTURE IDENTIFICATION (BCID)  Gregory Osborn is an 75 y.o. male who presented to [facility] on 04/02/2020 with a chief complaint of stroke  Assessment:  WBC 12.9, Temp 99.8  Name of physician (or Provider) Contacted: Dr. Iver Nestle  Current antibiotics: None  Changes to prescribed antibiotics recommended:  Cont to monitor off anti-biotics   Results for orders placed or performed during the hospital encounter of 04/02/20  Blood Culture ID Panel (Reflexed) (Collected: 04/08/2020  3:58 PM)  Result Value Ref Range   Enterococcus faecalis NOT DETECTED NOT DETECTED   Enterococcus Faecium NOT DETECTED NOT DETECTED   Listeria monocytogenes NOT DETECTED NOT DETECTED   Staphylococcus species DETECTED (A) NOT DETECTED   Staphylococcus aureus (BCID) NOT DETECTED NOT DETECTED   Staphylococcus epidermidis DETECTED (A) NOT DETECTED   Staphylococcus lugdunensis NOT DETECTED NOT DETECTED   Streptococcus species NOT DETECTED NOT DETECTED   Streptococcus agalactiae NOT DETECTED NOT DETECTED   Streptococcus pneumoniae NOT DETECTED NOT DETECTED   Streptococcus pyogenes NOT DETECTED NOT DETECTED   A.calcoaceticus-baumannii NOT DETECTED NOT DETECTED   Bacteroides fragilis NOT DETECTED NOT DETECTED   Enterobacterales NOT DETECTED NOT DETECTED   Enterobacter cloacae complex NOT DETECTED NOT DETECTED   Escherichia coli NOT DETECTED NOT DETECTED   Klebsiella aerogenes NOT DETECTED NOT DETECTED   Klebsiella oxytoca NOT DETECTED NOT DETECTED   Klebsiella pneumoniae NOT DETECTED NOT DETECTED   Proteus species NOT DETECTED NOT DETECTED   Salmonella species NOT DETECTED NOT DETECTED   Serratia marcescens NOT DETECTED NOT DETECTED   Haemophilus influenzae NOT DETECTED NOT DETECTED   Neisseria meningitidis NOT DETECTED NOT DETECTED   Pseudomonas aeruginosa NOT DETECTED NOT DETECTED   Stenotrophomonas maltophilia NOT DETECTED NOT DETECTED   Candida  albicans NOT DETECTED NOT DETECTED   Candida auris NOT DETECTED NOT DETECTED   Candida glabrata NOT DETECTED NOT DETECTED   Candida krusei NOT DETECTED NOT DETECTED   Candida parapsilosis NOT DETECTED NOT DETECTED   Candida tropicalis NOT DETECTED NOT DETECTED   Cryptococcus neoformans/gattii NOT DETECTED NOT DETECTED   Methicillin resistance mecA/C DETECTED (A) NOT DETECTED    Abran Duke, PharmD, BCPS Clinical Pharmacist Phone: [phone]

## 2020-04-10 NOTE — Progress Notes (Addendum)
STROKE TEAM PROGRESS NOTE   INTERVAL HISTORY Son and RN are at the bedside. Pt neuro unchanged, still lethargic but open eyes on voice, orientated to self, and people but not to time or age, severe dysarthria, able to follow central commands but not peripheral commands. Left hemiplegia. No fever, Na 154.    Vitals:   04/10/20 0400 04/10/20 0500 04/10/20 0600 04/10/20 0806  BP:    135/77  Pulse: 90 92 92 95  Resp: 19 18 20 20   Temp:    99.7 F (37.6 C)  TempSrc:    Oral  SpO2: 100% 100% 97% 98%  Weight:      Height:       CBC:  Recent Labs  Lab 04/09/20 0423 04/10/20 0630  WBC 12.9* 12.8*  HGB 14.6 13.0  HCT 43.8 41.5  MCV 88.8 91.0  PLT 261 220   Basic Metabolic Panel:  Recent Labs  Lab 04/07/20 0320 04/07/20 1224 04/08/20 1102 04/08/20 1558 04/09/20 0423 04/09/20 1052 04/09/20 2136 04/10/20 0630  NA 160*   < > 160*   < > 160*   < > 153* 154*  K 2.6*   < > 3.3*  --  3.8  --   --  3.4*  CL 122*   < > 125*  --  124*  --   --  118*  CO2 22   < > 25  --  22  --   --  26  GLUCOSE 115*   < > 117*  --  140*  --   --  134*  BUN 12   < > 34*  --  35*  --   --  27*  CREATININE 0.76   < > 1.20  --  0.98  --   --  0.85  CALCIUM 10.0   < > 9.5  --  9.1  --   --  8.5*  MG 2.3  --  2.7*  --   --   --   --   --    < > = values in this interval not displayed.   Lipid Panel:  Recent Labs  Lab 04/03/20 1005  CHOL 197  TRIG 109  HDL 57  CHOLHDL 3.5  VLDL 22  LDLCALC 04/05/20*   HgbA1c:  Recent Labs  Lab 04/03/20 1005  HGBA1C 5.6   Urine Drug Screen:  No results for input(s): LABOPIA, COCAINSCRNUR, LABBENZ, AMPHETMU, THCU, LABBARB in the last 168 hours.  Alcohol Level  No results for input(s): ETH in the last 168 hours.  IMAGING past 24 hours No results found.    PHYSICAL EXAM   Temp:  [97.8 F (36.6 C)-99.8 F (37.7 C)] 99.7 F (37.6 C) (02/24 0806) Pulse Rate:  [73-102] 95 (02/24 0806) Resp:  [10-22] 20 (02/24 0806) BP: (119-152)/(57-85) 135/77 (02/24  0806) SpO2:  [97 %-100 %] 98 % (02/24 0806)  General - Well nourished, well developed elderly Hispanic male, in no apparent distress.   Ophthalmologic - fundi not visualized due to noncooperation.  Neuro - Pt lethargic eyes open on voice, orientated to place, people, but not to situation, time or age. Not cooperative with name and repeat and follow central commands but not peripheral commands, able to pantomine. Left visual neglect vs. Hemianopia. Right gaze, not able to cross midline. Right pupil 47mm and left 72mm, sluggish to light. Left facial droop. Left UE and LE mild withdraw to pain. RUE and RLE spontaneous movement against gravity. Sensation, coordination not cooperative  and gait not tested.   ASSESSMENT/PLAN Gregory Osborn is a 75 y.o. male with a history of recent urologic issues including UTI (01/22), urinary retention with foley cath placed, hematuria (likely traumatic due to self cath) s/p cystoscopy 04/01/20 by Dr[facility]  showing BPH with recommended surgical treatment. Surgical history includes appendectomy.He had been doing well up until 11:25am when he was noted to develop left hemiplegia. EMS was contacted, and took him to the [facility] ED where stroke workup was undertaken. He was noted to have left hemiplegia and neglect. NIHSS score 15.  He was not given tPA due to the hematuria. Transferred urgently to [facility] for thrombectomy which was aborted after multiple attempts which were unsuccessful.   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    MRI showed large right MCA infarct involving entire right MCA, consistent with malignant right MCA syndrome.  Small right SAH likely due to hemorrhagic conversion.    MRA head showed right M1 occluded.   CT repeat  2/23 right MCA large infarct with MLS 65mm  2D Echo EF 60-65%, No shunt   LDL 118  HgbA1c 5.6  VTE prophylaxis - heparin subq  No antithrombotics PTA, now on ASA 325mg   Therapy recommendations:  SNF  Disposition:  pending   Cerebral Edema  Critical care medicine team consulted.  Repeat HCT 2/19 shows worsening shift and edema, MLS at 62mm  CT repeat 2/23 right MCA large infarct with MLS 26mm  Na goal 155-160  Na check Q6h - D/C  On 3% saline -> NS -> off  Na 154->158->160->157->160->153->154  Neurosurgery, Dr. 13m, consulted and advised patient is not a candidate for surgical intervention at this time.   On Depakote for headache management.    Hypertensive urgency  Home meds:  Lisinopril 5mg  daily   BP stable now  Off cleviprex  On amlodipine 10  Off lisinopril due to AKI . BP goal < 160 given hemorrhagic conversion . Long-term BP goal normotensive  HLD   No statin prior to admission  LDL 118, goal < 70  On lipitor 40  Continue statin on discharge  Dysphagia   NPO after mental decline  Off IVF  Speech on board  On TF and FW   May need PEG for SNF placement  Fever AKI  Urinary retention d/t BPH present on admission  Tmax 100.6 -> afebrile   Cre 0.93->2.08->1.20->0.98->0.85  Developed urinary retention and UTI about a month ago necessitating trial of intermittent self cath (failed) and then subsequent foley placement  S/p cystoscopy 2/15 with recommended surgical intervention for BPH [facility]  Hematuria appears transient after attempts at self cath per urology notes review. Hematuria not present on on UA yesterday.   Continue foley catheter, Monitor for hematuria. No signs of active bleeding. hgb stable. Foley output remains clear yellow.   UA WBC 11-20  Urine culture - pending  Blood culture - 1/2 contamination  CXR neg  Other Stroke Risk Factors  ETOH use, alcohol level <10, family reports he drinks  regularly but not a problem.   Other Active Problems  Hypokalemia - 3.4 - supplement  Code status - DNR  Hospital day # 8   [facility], MD PhD Stroke Neurology 04/10/2020 6:07 PM  To contact Stroke  Continuity provider, please refer to [email or web address] After hours, contact General Neurology

## 2020-04-10 NOTE — TOC Initial Note (Signed)
Transition of Care [facility]) - Initial/Assessment Note    Patient Details  Name: Gregory Osborn MRN: [id number] Date of Birth: 1945/04/27  Transition of Care [facility]) CM/SW Contact:    Glennon Mac, RN Phone Number: 04/10/2020, 10:50 AM  Clinical Narrative:  75 yo male admmitted to [facility] ED on 2/16 with L hemiplegia and facial drop, imaging reveals R M1 occlusion with attempted thrombectomy (unsuccessful). Small R SAH, likely due to hemorrhagic conversion. PTA, pt independent, lives with significant other.  Supportive son has been at bedside.  PT/OT recommending SNF; will need more permanent means of feeding if patient unable to swallow.  TOC team will continue to follow.                  Expected Discharge Plan: Skilled Nursing Facility Barriers to Discharge: Continued Medical Work up   Patient Goals and CMS Choice        Expected Discharge Plan and Services Expected Discharge Plan: Skilled Nursing Facility   Discharge Planning Services: CM Consult                                          Prior Living Arrangements/Services   Lives with:: Spouse Patient language and need for interpreter reviewed:: Yes        Need for Family Participation in Patient Care: Yes (Comment) Care giver support system in place?: Yes (comment)   Criminal Activity/Legal Involvement Pertinent to Current Situation/Hospitalization: No - Comment as needed  Activities of Daily Living Home Assistive Devices/Equipment: None ADL Screening (condition at time of admission) Patient's cognitive ability adequate to safely complete daily activities?: No Is the patient deaf or have difficulty hearing?: No Does the patient have difficulty seeing, even when wearing glasses/contacts?: No Does the patient have difficulty concentrating, remembering, or making decisions?: Yes Patient able to express need for assistance with ADLs?: No Does the patient have difficulty dressing or bathing?: Yes Independently  performs ADLs?: No Communication: Needs assistance Is this a change from baseline?: Change from baseline, expected to last >3 days Dressing (OT): Dependent Is this a change from baseline?: Change from baseline, expected to last >3 days Grooming: Dependent Is this a change from baseline?: Change from baseline, expected to last >3 days Feeding: Dependent Is this a change from baseline?: Change from baseline, expected to last >3 days Bathing: Dependent Is this a change from baseline?: Change from baseline, expected to last >3 days Toileting: Dependent Is this a change from baseline?: Change from baseline, expected to last >3days In/Out Bed: Dependent Is this a change from baseline?: Change from baseline, expected to last >3 days Walks in Home: Dependent Is this a change from baseline?: Change from baseline, expected to last >3 days Does the patient have difficulty walking or climbing stairs?: Yes Weakness of Legs: Left Weakness of Arms/Hands: Left  Permission Sought/Granted                  Emotional Assessment   Attitude/Demeanor/Rapport: Unable to Assess Affect (typically observed): Unable to Assess        Admission diagnosis:  Stroke (cerebrum) [facility]) [I63.9] Patient Active Problem List   Diagnosis Date Noted  . Aspiration into airway   . Stroke (cerebrum) (HCC) 04/02/2020   PCP:  Patient, No Pcp Per Pharmacy:   [id number] - [place], [place] - [address] AT [facility] [address]  [address] [place] [address] Phone: [phone] Fax: [phone]     Social Determinants of Health (SDOH) Interventions    Readmission Risk Interventions No flowsheet data found.

## 2020-04-11 DIAGNOSIS — R1312 Dysphagia, oropharyngeal phase: Secondary | ICD-10-CM | POA: Diagnosis not present

## 2020-04-11 DIAGNOSIS — I63511 Cerebral infarction due to unspecified occlusion or stenosis of right middle cerebral artery: Secondary | ICD-10-CM | POA: Diagnosis not present

## 2020-04-11 DIAGNOSIS — E876 Hypokalemia: Secondary | ICD-10-CM | POA: Diagnosis not present

## 2020-04-11 DIAGNOSIS — G936 Cerebral edema: Secondary | ICD-10-CM | POA: Diagnosis not present

## 2020-04-11 LAB — CBC
HCT: 43.1 % (ref 39.0–52.0)
Hemoglobin: 13.6 g/dL (ref 13.0–17.0)
MCH: 28.7 pg (ref 26.0–34.0)
MCHC: 31.6 g/dL (ref 30.0–36.0)
MCV: 90.9 fL (ref 80.0–100.0)
Platelets: 219 10*3/uL (ref 150–400)
RBC: 4.74 MIL/uL (ref 4.22–5.81)
RDW: 13.1 % (ref 11.5–15.5)
WBC: 12.7 10*3/uL — ABNORMAL HIGH (ref 4.0–10.5)
nRBC: 0 % (ref 0.0–0.2)

## 2020-04-11 LAB — BASIC METABOLIC PANEL
Anion gap: 11 (ref 5–15)
BUN: 26 mg/dL — ABNORMAL HIGH (ref 8–23)
CO2: 26 mmol/L (ref 22–32)
Calcium: 9 mg/dL (ref 8.9–10.3)
Chloride: 114 mmol/L — ABNORMAL HIGH (ref 98–111)
Creatinine, Ser: 0.86 mg/dL (ref 0.61–1.24)
GFR, Estimated: >60 mL/min (ref >60)
Glucose, Bld: 104 mg/dL — ABNORMAL HIGH (ref 70–99)
Potassium: 3.6 mmol/L (ref 3.5–5.1)
Sodium: 151 mmol/L — ABNORMAL HIGH (ref 135–145)

## 2020-04-11 LAB — GLUCOSE, CAPILLARY
Glucose-Capillary: 97 mg/dL (ref 70–99)
Glucose-Capillary: 88 mg/dL (ref 70–99)
Glucose-Capillary: 106 mg/dL — ABNORMAL HIGH (ref 70–99)

## 2020-04-11 MED ORDER — VALPROATE SODIUM 100 MG/ML IV SOLN
250.0000 mg | Freq: Once | INTRAVENOUS | Status: AC
  Administered 2020-04-11: 250 mg via INTRAVENOUS
  Filled 2020-04-11: qty 2.5

## 2020-04-11 MED ORDER — VALPROIC ACID 250 MG/5ML PO SOLN
250.0000 mg | Freq: Three times a day (TID) | ORAL | Status: DC
  Administered 2020-04-11 – 2020-04-14 (×9): 250 mg
  Filled 2020-04-11 (×9): qty 5

## 2020-04-11 NOTE — Progress Notes (Addendum)
STROKE TEAM PROGRESS NOTE   INTERVAL HISTORY Patient pulled out his feeding tube overnight.  Na 151 Remains somnolent.   Son at bedside.He reports patient has been sleeping most of the time during his visits over the past few days.  Vitals:   04/10/20 1936 04/10/20 2313 04/11/20 0310 04/11/20 0816  BP: 125/67 (!) 147/66 (!) 144/73 128/68  Pulse: 92 88 83 82  Resp: 18 15  15   Temp: 99.6 F (37.6 C) 98.6 F (37 C) 99.5 F (37.5 C) 98.5 F (36.9 C)  TempSrc: Oral Oral Oral Axillary  SpO2: 97% 96% 99% 98%  Weight:      Height:       CBC:  Recent Labs  Lab 04/10/20 0630 04/11/20 0327  WBC 12.8* 12.7*  HGB 13.0 13.6  HCT 41.5 43.1  MCV 91.0 90.9  PLT 220 219   Basic Metabolic Panel:  Recent Labs  Lab 04/07/20 0320 04/07/20 1224 04/08/20 1102 04/08/20 1558 04/10/20 0630 04/11/20 0327  NA 160*   < > 160*   < > 154* 151*  K 2.6*   < > 3.3*   < > 3.4* 3.6  CL 122*   < > 125*   < > 118* 114*  CO2 22   < > 25   < > 26 26  GLUCOSE 115*   < > 117*   < > 134* 104*  BUN 12   < > 34*   < > 27* 26*  CREATININE 0.76   < > 1.20   < > 0.85 0.86  CALCIUM 10.0   < > 9.5   < > 8.5* 9.0  MG 2.3  --  2.7*  --   --   --    < > = values in this interval not displayed.   Lipid Panel:  No results for input(s): CHOL, TRIG, HDL, CHOLHDL, VLDL, LDLCALC in the last 168 hours. HgbA1c:  No results for input(s): HGBA1C in the last 168 hours. Urine Drug Screen:  No results for input(s): LABOPIA, COCAINSCRNUR, LABBENZ, AMPHETMU, THCU, LABBARB in the last 168 hours.  Alcohol Level  No results for input(s): ETH in the last 168 hours.  IMAGING past 24 hours No results found.    PHYSICAL EXAM   Temp:  [98.1 F (36.7 C)-99.6 F (37.6 C)] 98.5 F (36.9 C) (02/25 0816) Pulse Rate:  [82-92] 82 (02/25 0816) Resp:  [15-20] 15 (02/25 0816) BP: (125-147)/(66-73) 128/68 (02/25 0816) SpO2:  [95 %-99 %] 98 % (02/25 0816)  General - Well nourished, well developed elderly Hispanic male, in no  apparent distress.   Ophthalmologic - fundi not visualized due to noncooperation.  Neuro -Somnolent. Does not arouse with verbal stimuli by myself or his son. Arouses to tactile stimuli. States name and that he is in the hospital when prompted. Keeps eyes closed.  Left visual neglect vs. Hemianopia. Right gaze, not able to cross midline. Right pupil 70mm and left 31mm, sluggish to light. Left facial droop. Left UE and LE mild withdraw to pain. RUE and RLE spontaneous movement against gravity. Sensation, coordination not cooperative and gait not tested.  ASSESSMENT/PLAN Gregory Osborn is a 75 y.o. male with a history of recent urologic issues including UTI (01/22), urinary retention with foley cath placed, hematuria (likely traumatic due to self cath) s/p cystoscopy 04/01/20 by Dr. [facility]20 [facility]  showing BPH with recommended surgical treatment. Surgical history includes appendectomy.He had been doing well up until 11:25am when he was noted to develop left  hemiplegia. EMS was contacted, and took him to the [facility] ED where stroke workup was undertaken. He was noted to have left hemiplegia and neglect. NIHSS score 15.  He was not given tPA due to the hematuria. Transferred urgently to [facility] for thrombectomy which was aborted after multiple attempts which were unsuccessful.   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    MRI showed large right MCA infarct involving entire right MCA, consistent with malignant right MCA syndrome.  Small right SAH likely due to hemorrhagic conversion.    MRA head showed right M1 occluded.   CT repeat 2/23 right MCA large infarct with MLS 40mm  2D Echo EF 60-65%, No shunt   May consider 30 day cardiac event monitoring as outpt to rule out afib  LDL  118  HgbA1c 5.6  VTE prophylaxis - heparin subq  No antithrombotics PTA, now on ASA 325mg   Therapy recommendations:  SNF  Disposition:  pending   Cerebral Edema  Critical care medicine team consulted.  Repeat HCT 2/19 shows worsening shift and edema, MLS at 42mm  CT repeat 2/23 right MCA large infarct with MLS 72mm  On 3% saline -> NS -> off  Na 154->158->160->157->160->153->154->151  Neurosurgery, Dr. 13m, consulted and advised patient is not a candidate for surgical intervention at this time.   On Depakote for headache management.    Hypertensive urgency  Home meds:  Lisinopril 5mg  daily   BP stable now  Off cleviprex  On amlodipine 10  Off lisinopril due to AKI . BP goal < 160 given hemorrhagic conversion . Long-term BP goal normotensive  HLD   No statin prior to admission  LDL 118, goal < 70  On lipitor 40  Continue statin on discharge  Dysphagia   NPO after mental decline  Off IVF  Speech on board  On TF and FW   May need PEG for SNF placement  Fever AKI  Urinary retention d/t BPH present on admission  Tmax 100.6 -> afebrile   Cre 0.93->2.08->1.20->0.98->0.85  Developed urinary retention and UTI about a month ago necessitating trial of intermittent self cath (failed) and then subsequent foley placement  S/p cystoscopy 2/15 with recommended surgical intervention for BPH by Dr. 06-06-1972 [facility]  Hematuria appears transient after attempts at self cath per urology notes review. Hematuria not present on on UA yesterday.   Continue foley catheter, Monitor for hematuria. No signs of active bleeding. hgb stable. Foley output remains clear yellow.   UA WBC 11-20, but urine culture neg  Blood culture - 1/2 contamination  CXR neg  Other Stroke Risk Factors  ETOH use, alcohol level <10, family reports he drinks regularly but not a problem.   Other Active Problems  Hypokalemia - 3.4 ->3.6   Code status - DNR  Hospital day #  9   ATTENDING NOTE: I reviewed above note and agree with the assessment and plan. Pt was seen and examined.   Patient son at bedside.  Patient currently sleepy after working with PT/OT.  Patient sat in chair for about 2 hours, awake alert, per son, patient speech clear.  Currently he is worn out and in sleep.  Neuro no change on examination earlier today by NP.  Patient pull out core track overnight, now has new core track  placed, still on tube feeding.  Will follow up swallow function next week, if still not passing swallow, may consider PEG tube for SNF placement.  BP stable, sodium trending down today 151.  For detailed assessment and plan, please refer to above as I have made changes wherever appropriate.   Marvel Plan, MD PhD Stroke Neurology 04/11/2020 3:43 PM    To contact Stroke Continuity provider, please refer to [email or web address]. After hours, contact General Neurology

## 2020-04-11 NOTE — Procedures (Addendum)
Cortrak  Person Inserting Tube:  Osa Craver, RD Tube Type:  Cortrak - 43 inches Tube Location:  Left nare Initial Placement:  Stomach Secured by: Bridle Technique Used to Measure Tube Placement:  Documented cm marking at nare/ corner of mouth Cortrak Secured At:  61 cm Procedure Comments:  Cortrak Tube Team Note:  Consult received to replace a Cortrak feeding tube. Pt pulled Cortrak and bridle out overnight.   No x-ray is required. RN may begin using tube.   If the tube becomes dislodged please keep the tube and contact the Cortrak team at [email or web address] (password [email or web address]) for replacement.  If after hours and replacement cannot be delayed, place a NG tube and confirm placement with an abdominal x-ray.   Romelle Starcher MS, RDN, LDN, CNSC Registered Dietitian III Clinical Nutrition RD Pager and On-Call Pager Number Located in [place]

## 2020-04-11 NOTE — Progress Notes (Signed)
Physical Therapy Treatment Patient Details Name: Gregory Osborn MRN: [id number] DOB: [birth year] Today's Date: 04/11/2020    History of Present Illness 75 yo male admmitted to [facility] ED on 2/16 with L hemiplegia and facial drop, imaging reveals R M1 occlusion with attempted thrombectomy (unsuccessful). Small R SAH, likely due to hemorrhagic conversion. ETT 2/16-present. PMH includes HTN, urinary retention with frequent UTIs, BPH, hematuria.    PT Comments    The pt was seen by PT/OT to safely progress pt mobility to complete OOB transfer to chair. The pt was able to complete bed mobility with maxA of 2, needing assist for all extremities, especially L UE and LLE due to inattention and weakness. The pt continues to require maxA at times to maintain seated balance, but was able to pull himself forward when cued x2 from recliner. The pt was able to use lateral scoot transfer to recliner, but required maxA of 2 to complete at this time due to weakness, deficits in motor planning, coordination, and processing. The pt will continue to benefit from skilled PT to progress OOB mobility and transfers, as well as to improve pt participation and functional strength to assist with bed mobility and seated balance.     Follow Up Recommendations  SNF     Equipment Recommendations  Rolling walker with 5" wheels;Wheelchair (measurements PT);Wheelchair cushion (measurements PT);Hospital bed    Recommendations for Other Services       Precautions / Restrictions Precautions Precautions: Fall Precaution Comments: L hemiplegia, L inattention Restrictions Weight Bearing Restrictions: No    Mobility  Bed Mobility Overal bed mobility: Needs Assistance Bed Mobility: Rolling;Supine to Sit Rolling: Max assist;+2 for safety/equipment   Supine to sit: Max assist;+2 for physical assistance;+2 for safety/equipment     General bed mobility comments: maxA with movement of each extremitiy and maxA to elevate trunk  from [facility] with assist of 2 for safety. maxA to maintain seated balance    Transfers Overall transfer level: Needs assistance Equipment used: 2 person hand held assist Transfers: Lateral/Scoot Transfers          Lateral/Scoot Transfers: Total assist;+2 safety/equipment;+2 physical assistance General transfer comment: pt able to complete lateral scoot with maxA of 2 and use of bed pad. put unable to generate reach, pull or lift withuse of LE.  Ambulation/Gait             General Gait Details: deferred      Modified Rankin (Stroke Patients Only) Modified Rankin (Stroke Patients Only) Pre-Morbid Rankin Score: No symptoms Modified Rankin: Severe disability     Balance Overall balance assessment: Needs assistance Sitting-balance support: No upper extremity supported;Feet supported Sitting balance-Leahy Scale: Zero Sitting balance - Comments: mod/maxA to maintain static sitting throuhgout. pt able to tolerate down onto R elbow and push to sitting, but unable to use RUE to stabilize. Postural control: Posterior lean                                  Cognition Arousal/Alertness: Awake/alert Behavior During Therapy: WFL for tasks assessed/performed;Flat affect Overall Cognitive Status: Impaired/Different from baseline Area of Impairment: Orientation;Attention;Memory;Following commands;Safety/judgement;Awareness;Problem solving                 Orientation Level: Disoriented to;Situation;Time Current Attention Level: Sustained Memory: Decreased recall of precautions;Decreased short-term memory Following Commands: Follows one step commands inconsistently;Follows one step commands with increased time Safety/Judgement: Decreased awareness of deficits;Decreased awareness of safety Awareness: Intellectual  Problem Solving: Slow processing;Decreased initiation;Difficulty sequencing;Requires tactile cues;Requires verbal cues General Comments: Pt with improved  alertness, able to follow simple commands but limited by inattention and neglect that requires increased multimodal cues and physical assist to attend/follow commands. no active command following with L extremities.      Exercises      General Comments General comments (skin integrity, edema, etc.): VSS on RA, translator Damaris [phone]      Pertinent Vitals/Pain Pain Assessment: Faces Pain Score: 9  Faces Pain Scale: Hurts a little bit Pain Location: both legs Pain Descriptors / Indicators: Stabbing Pain Intervention(s): Monitored during session;Repositioned;Patient requesting pain meds-RN notified           PT Goals (current goals can now be found in the care plan section) Acute Rehab PT Goals Patient Stated Goal: none stated PT Goal Formulation: Patient unable to participate in goal setting Time For Goal Achievement: 04/21/20 Potential to Achieve Goals: Fair Progress towards PT goals: Progressing toward goals    Frequency    Min 3X/week      PT Plan Current plan remains appropriate    Co-evaluation PT/OT/SLP Co-Evaluation/Treatment: Yes Reason for Co-Treatment: For patient/therapist safety;To address functional/ADL transfers PT goals addressed during session: Mobility/safety with mobility;Proper use of DME;Balance        AM-PAC PT "6 Clicks" Mobility   Outcome Measure  Help needed turning from your back to your side while in a flat bed without using bedrails?: Total Help needed moving from lying on your back to sitting on the side of a flat bed without using bedrails?: Total Help needed moving to and from a bed to a chair (including a wheelchair)?: Total Help needed standing up from a chair using your arms (e.g., wheelchair or bedside chair)?: Total Help needed to walk in hospital room?: Total Help needed climbing 3-5 steps with a railing? : Total 6 Click Score: 6    End of Session Equipment Utilized During Treatment: Gait belt Activity Tolerance:  Patient limited by fatigue Patient left: in chair;with call bell/phone within reach;with chair alarm set;with restraints reapplied Nurse Communication: Mobility status;Need for lift equipment PT Visit Diagnosis: Other abnormalities of gait and mobility (R26.89);Hemiplegia and hemiparesis Hemiplegia - Right/Left: Left Hemiplegia - dominant/non-dominant: Dominant Hemiplegia - caused by: Cerebral infarction     Time: 5573-2202 PT Time Calculation (min) (ACUTE ONLY): 35 min  Charges:  $Therapeutic Activity: 8-22 mins                     Rolm Baptise, PT, DPT   Acute Rehabilitation Department Pager #: [phone]   Gaetana Michaelis 04/11/2020, 10:20 AM

## 2020-04-11 NOTE — Progress Notes (Signed)
Nutrition Follow-up  DOCUMENTATION CODES:   Not applicable  INTERVENTION:  Continue to advance tube feeds via Cortrak NGT using Osmolite 1.2 cal formula by 10 ml every 4 hours to goal rate of 65 ml/hr.  Provide 45 ml Prosource TF once daily per tube.   Free water flushes of 200 ml q 6 hours per tube. (MD to adjust as appropriate)  Tube feeding regimen provides 1912 kcal, 98 grams of protein, and 2079 ml free water.   NUTRITION DIAGNOSIS:   Inadequate oral intake related to lethargy/confusion,dysphagia as evidenced by NPO status; ongoing  GOAL:   Patient will meet greater than or equal to 90% of their needs; met with TF  MONITOR:   TF tolerance,Skin,Weight trends,Labs,I & O's  REASON FOR ASSESSMENT:   Consult Enteral/tube feeding initiation and management  ASSESSMENT:   75 year old male who presented on 2/16 with stroke with left hemiplegia. PMH of UTI, urinary retention, hematuria, and appendectomy. Pt with right malignant MCA infarct due to right M1 occlusion s/p unsuccessful thrombectomy  2/16 - s/p diagnostic cerebral angiogram and unsuccessful mechanical thrombectomy attempt in IR 2/19 NPO due to mental decline 2/21 Cortrak NGT placed for TF  Cortrak pulled out by patient overnight. Cortrak replaced today with tip of tube in stomach. Pt continues on NPO status. Pt continues to wax/wan. Tube feeds have been restarted and has been tolerating it well. Per MD, may need PEG for SNF placement. Labs and medications reviewed.   Diet Order:   Diet Order            Diet NPO time specified Except for: Ice Chips  Diet effective now                 EDUCATION NEEDS:   No education needs have been identified at this time  Skin:  Skin Assessment: Reviewed RN Assessment Skin Integrity Issues:: Incisions Incisions: right groin  Last BM:  2/24  Height:   Ht Readings from Last 1 Encounters:  04/02/20 '5\' 5"'  (1.651 m)    Weight:   Wt Readings from Last 1  Encounters:  04/02/20 76.3 kg   BMI:  Body mass index is 27.99 kg/m.  Estimated Nutritional Needs:   Kcal:  1800-2000  Protein:  85-100 grams  Fluid:  1.8-2.0 L  Corrin Parker, MS, RD, LDN RD pager number/after hours weekend pager number on Amion.

## 2020-04-11 NOTE — Progress Notes (Signed)
Occupational Therapy Treatment Patient Details Name: Gregory Osborn MRN: [id number] DOB: [birth year] Today's Date: 04/11/2020    History of present illness 75 yo male admmitted to [facility] ED on 2/16 with L hemiplegia and facial drop, imaging reveals R M1 occlusion with attempted thrombectomy (unsuccessful). Small R SAH, likely due to hemorrhagic conversion. ETT 2/16-present. PMH includes HTN, urinary retention with frequent UTIs, BPH, hematuria.   OT comments  Pt. Seen for skilled therapy session with PT with focus on progression of bed mobility and lateral scoot transfer to chair.  Utilized tele interpreter Damaris [phone] during session.  Pt. C/o B thigh pain but agreeable to eob/oob.  Tendency to keep head to R but has ability and active movement to L but unable to sustain without physical assist.   Max a x2 for bed mobility and transfer.  L side greater assist than R due to weakness.  Was able to pull himself forward from recliner with max cues. Cont. With progression of adls and mobility next session.         Follow Up Recommendations  SNF    Equipment Recommendations  Other (comment)    Recommendations for Other Services PT consult;Speech consult    Precautions / Restrictions Precautions Precautions: Fall Precaution Comments: L hemiplegia, L inattention Restrictions Weight Bearing Restrictions: No       Mobility Bed Mobility Overal bed mobility: Needs Assistance Bed Mobility: Rolling;Supine to Sit Rolling: Max assist;+2 for safety/equipment   Supine to sit: Max assist;+2 for physical assistance;+2 for safety/equipment Sit to supine: Total assist;+2 for physical assistance Sit to sidelying: Total assist;+2 for physical assistance;+2 for safety/equipment General bed mobility comments: maxA with movement of each extremitiy and maxA to elevate trunk from [facility] with assist of 2 for safety. maxA to maintain seated balance    Transfers Overall transfer level: Needs  assistance Equipment used: 2 person hand held assist Transfers: Lateral/Scoot Transfers          Lateral/Scoot Transfers: Total assist;+2 safety/equipment;+2 physical assistance General transfer comment: pt able to complete lateral scoot with maxA of 2 and use of bed pad. put unable to generate reach, pull or lift withuse of LE.    Balance Overall balance assessment: Needs assistance Sitting-balance support: No upper extremity supported;Feet supported Sitting balance-Leahy Scale: Zero Sitting balance - Comments: mod/maxA to maintain static sitting throuhgout. pt able to tolerate down onto R elbow and push to sitting, but unable to use RUE to stabilize. Postural control: Posterior lean                                 ADL either performed or assessed with clinical judgement   ADL Overall ADL's : Needs assistance/impaired     Grooming: Wash/dry face;Sitting;Minimal assistance;Oral care Grooming Details (indicate cue type and reason): Min A for washing face. Cues for sequencing. (supported sitting in recliner), max a for oral care-able to hold mouth swab but biting on it for the water and unable to follow inst. to move it around the mouth                 Toilet Transfer: +2 for physical assistance;+2 for safety/equipment;Total assistance Toilet Transfer Details (indicate cue type and reason): simulated eob side squat/pivot to recliner with arm dropped on one side         Functional mobility during ADLs: Total assistance;+2 for physical assistance;+2 for safety/equipment General ADL Comments: Max-Total A for ADLs and bed  mobility.     Vision       Perception     Praxis      Cognition Arousal/Alertness: Awake/alert Behavior During Therapy: WFL for tasks assessed/performed;Flat affect Overall Cognitive Status: Impaired/Different from baseline Area of Impairment: Orientation;Attention;Memory;Following commands;Safety/judgement;Awareness;Problem solving                  Orientation Level: Disoriented to;Situation;Time Current Attention Level: Sustained Memory: Decreased recall of precautions;Decreased short-term memory Following Commands: Follows one step commands inconsistently;Follows one step commands with increased time Safety/Judgement: Decreased awareness of deficits;Decreased awareness of safety Awareness: Intellectual Problem Solving: Slow processing;Decreased initiation;Difficulty sequencing;Requires tactile cues;Requires verbal cues General Comments: Pt with improved alertness, able to follow simple commands but limited by inattention and neglect that requires increased multimodal cues and physical assist to attend/follow commands. no active command following with L extremities.        Exercises     Shoulder Instructions       General Comments VSS on RA, translator Damaris [phone]    Pertinent Vitals/ Pain       Pain Assessment: 0-10 Pain Score: 10-Worst pain ever Faces Pain Scale: Hurts a little bit Pain Location: both legs Pain Descriptors / Indicators: Stabbing Pain Intervention(s): Limited activity within patient's tolerance;Monitored during session;Other (comment) (notified rn of pt. c/o pain)  Home Living                                          Prior Functioning/Environment              Frequency  Min 2X/week        Progress Toward Goals  OT Goals(current goals can now be found in the care plan section)  Progress towards OT goals: Progressing toward goals  Acute Rehab OT Goals Patient Stated Goal: none stated  Plan Discharge plan remains appropriate    Co-evaluation    PT/OT/SLP Co-Evaluation/Treatment: Yes Reason for Co-Treatment: To address functional/ADL transfers;Complexity of the patient's impairments (multi-system involvement) PT goals addressed during session: Mobility/safety with mobility;Proper use of DME;Balance        AM-PAC OT "6 Clicks" Daily Activity      Outcome Measure   Help from another person eating meals?: Total Help from another person taking care of personal grooming?: A Lot Help from another person toileting, which includes using toliet, bedpan, or urinal?: Total Help from another person bathing (including washing, rinsing, drying)?: Total Help from another person to put on and taking off regular upper body clothing?: A Lot Help from another person to put on and taking off regular lower body clothing?: Total 6 Click Score: 8    End of Session Equipment Utilized During Treatment: Gait belt  OT Visit Diagnosis: Unsteadiness on feet (R26.81);Other abnormalities of gait and mobility (R26.89);Muscle weakness (generalized) (M62.81);Pain;Hemiplegia and hemiparesis;Low vision, both eyes (H54.2);Other symptoms and signs involving cognitive function Hemiplegia - Right/Left: Right Hemiplegia - caused by: Cerebral infarction   Activity Tolerance Patient tolerated treatment well   Patient Left in chair;with call bell/phone within reach;with chair alarm set   Nurse Communication Mobility status;Patient requests pain meds        Time: 5681-2751 OT Time Calculation (min): 37 min  Charges: OT General Charges $OT Visit: 1 Visit OT Treatments $Self Care/Home Management : 8-22 mins  Boneta Lucks, COTA/L Acute Rehabilitation [phone]   Robet Leu 04/11/2020, 12:39 PM

## 2020-04-12 DIAGNOSIS — I161 Hypertensive emergency: Secondary | ICD-10-CM | POA: Diagnosis not present

## 2020-04-12 DIAGNOSIS — E785 Hyperlipidemia, unspecified: Secondary | ICD-10-CM | POA: Diagnosis not present

## 2020-04-12 DIAGNOSIS — R1310 Dysphagia, unspecified: Secondary | ICD-10-CM | POA: Diagnosis not present

## 2020-04-12 DIAGNOSIS — I6300 Cerebral infarction due to thrombosis of unspecified precerebral artery: Secondary | ICD-10-CM | POA: Diagnosis not present

## 2020-04-12 LAB — BASIC METABOLIC PANEL
Anion gap: 9 (ref 5–15)
BUN: 27 mg/dL — ABNORMAL HIGH (ref 8–23)
CO2: 24 mmol/L (ref 22–32)
Calcium: 8.9 mg/dL (ref 8.9–10.3)
Chloride: 114 mmol/L — ABNORMAL HIGH (ref 98–111)
Creatinine, Ser: 0.72 mg/dL (ref 0.61–1.24)
GFR, Estimated: >60 mL/min (ref >60)
Glucose, Bld: 133 mg/dL — ABNORMAL HIGH (ref 70–99)
Potassium: 3.5 mmol/L (ref 3.5–5.1)
Sodium: 147 mmol/L — ABNORMAL HIGH (ref 135–145)

## 2020-04-12 LAB — CBC
HCT: 40.3 % (ref 39.0–52.0)
Hemoglobin: 13.4 g/dL (ref 13.0–17.0)
MCH: 29.3 pg (ref 26.0–34.0)
MCHC: 33.3 g/dL (ref 30.0–36.0)
MCV: 88.2 fL (ref 80.0–100.0)
Platelets: 238 10*3/uL (ref 150–400)
RBC: 4.57 MIL/uL (ref 4.22–5.81)
RDW: 12.8 % (ref 11.5–15.5)
WBC: 10.6 10*3/uL — ABNORMAL HIGH (ref 4.0–10.5)
nRBC: 0 % (ref 0.0–0.2)

## 2020-04-12 NOTE — Plan of Care (Signed)
°  Problem: Education: Goal: Knowledge of disease or condition will improve Outcome: Progressing Patient son visiting all questions answered regarding causes of stroke

## 2020-04-12 NOTE — Progress Notes (Addendum)
STROKE TEAM PROGRESS NOTE   INTERVAL HISTORY No acute events Keeps eyes closed. Arousing to verbal stimuli.  Remains in left wrist restraint to prevent dislodging feeding tube. Somnolent, arouses to verbal stimuli. Keeps eyes closed.  Vitals:   04/11/20 1602 04/11/20 2010 04/11/20 2313 04/12/20 0340  BP: 128/72 131/70 132/70 140/68  Pulse: 87 83 78 80  Resp: 14 14 13 15   Temp: 98.8 F (37.1 C) 98.4 F (36.9 C) 98.7 F (37.1 C) 97.7 F (36.5 C)  TempSrc: Oral Oral Axillary Oral  SpO2: 97% 99% 98% 97%  Weight:      Height:       CBC:  Recent Labs  Lab 04/11/20 0327 04/12/20 0107  WBC 12.7* 10.6*  HGB 13.6 13.4  HCT 43.1 40.3  MCV 90.9 88.2  PLT 219 238   Basic Metabolic Panel:  Recent Labs  Lab 04/07/20 0320 04/07/20 1224 04/08/20 1102 04/08/20 1558 04/11/20 0327 04/12/20 0107  NA 160*   < > 160*   < > 151* 147*  K 2.6*   < > 3.3*   < > 3.6 3.5  CL 122*   < > 125*   < > 114* 114*  CO2 22   < > 25   < > 26 24  GLUCOSE 115*   < > 117*   < > 104* 133*  BUN 12   < > 34*   < > 26* 27*  CREATININE 0.76   < > 1.20   < > 0.86 0.72  CALCIUM 10.0   < > 9.5   < > 9.0 8.9  MG 2.3  --  2.7*  --   --   --    < > = values in this interval not displayed.   Lipid Panel:  No results for input(s): CHOL, TRIG, HDL, CHOLHDL, VLDL, LDLCALC in the last 168 hours. HgbA1c:  No results for input(s): HGBA1C in the last 168 hours. Urine Drug Screen:  No results for input(s): LABOPIA, COCAINSCRNUR, LABBENZ, AMPHETMU, THCU, LABBARB in the last 168 hours.  Alcohol Level  No results for input(s): ETH in the last 168 hours.  IMAGING past 24 hours No results found.    PHYSICAL EXAM   Temp:  [97.7 F (36.5 C)-98.8 F (37.1 C)] 97.7 F (36.5 C) (02/26 0340) Pulse Rate:  [78-87] 80 (02/26 0340) Resp:  [13-15] 15 (02/26 0340) BP: (128-146)/(67-78) 140/68 (02/26 0340) SpO2:  [97 %-99 %] 97 % (02/26 0340)  General - Well nourished, well developed elderly Hispanic male, in no  apparent distress.   Ophthalmologic - fundi not visualized due to noncooperation.  Neuro -Somnolent. Keeps eyes closed. Does not arouse with verbal stimuli by myself or his son. Arouses to tactile stimuli. Able to state name and states he is "bien" this morning. Able to state he is in the hospital. Does not respond to further orientation questions. Verbalization is sparse with one-two word responses.  Keeps eyes closed.  Left visual neglect vs. Hemianopia. Right gaze, not able to cross midline. Right pupil 54mm and left 36mm, sluggish to light. Left facial droop. Left UE and LE mild withdraw to pain. RUE and RLE spontaneous movement against gravity. Sensation, coordination not cooperative and gait not tested.  ASSESSMENT/PLAN Gregory Osborn is a 75 y.o. male with a history of recent urologic issues including UTI (01/22), urinary retention with foley cath placed, hematuria (likely traumatic due to self cath) s/p cystoscopy 04/01/20 by Dr[facility]03[facility]20 [facility]  showing BPH with recommended surgical  treatment. Surgical history includes appendectomy.He had been doing well up until 11:25am when he was noted to develop left hemiplegia. EMS was contacted, and took him to the [facility] ED where stroke workup was undertaken. He was noted to have left hemiplegia and neglect. NIHSS score 15.  He was not given tPA due to the hematuria. Transferred urgently to [facility] for thrombectomy which was aborted after multiple attempts which were unsuccessful.   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    MRI showed large right MCA infarct involving entire right MCA, consistent with malignant right MCA syndrome.  Small right SAH likely due to hemorrhagic conversion.    MRA head showed right M1 occluded.   CT  repeat 2/23 right MCA large infarct with MLS 4mm  2D Echo EF 60-65%, No shunt   May consider 30 day cardiac event monitoring as outpt to rule out afib  LDL 118  HgbA1c 5.6  VTE prophylaxis - heparin subq  No antithrombotics PTA, now on ASA 325mg   Therapy recommendations:  SNF  Disposition:  pending   Cerebral Edema  Critical care medicine team consulted.  Repeat HCT 2/19 shows worsening shift and edema, MLS at 61mm  CT repeat 2/23 right MCA large infarct with MLS 11mm  On 3% saline -> NS -> off  Na 160->157->160->153->154->151-> 147  Neurosurgery, Dr. 13m, consulted and advised patient is not a candidate for surgical intervention at this time.   On Depakote for headache management.    Hypertensive urgency  Home meds:  Lisinopril 5mg  daily   BP stable now  Off cleviprex  On amlodipine 10  Off lisinopril due to AKI . BP goal < 160 given hemorrhagic conversion . Long-term BP goal normotensive  HLD   No statin prior to admission  LDL 118, goal < 70  On lipitor 40  Continue statin on discharge  Dysphagia   NPO after mental decline  Off IVF  Speech on board  On TF and FW   May need PEG for SNF placement  Fever AKI  Urinary retention d/t BPH present on admission  Tmax 100.6 -> afebrile   Cre 0.93->2.08->1.20->0.98->0.85->0.72  Developed urinary retention and UTI about a month ago necessitating trial of intermittent self cath (failed) and then subsequent foley placement  S/p cystoscopy 2/15 with recommended surgical intervention for BPH by Dr[facility] 06[facility]06-[facility]  Hematuria appears transient after attempts at self cath per urology notes review. Hematuria not present on on UA at admission.  Continue foley catheter, Monitor for hematuria. No signs of active bleeding. hgb stable. Foley output remains clear yellow.   UA WBC 11-20, but urine culture neg  Blood culture - 1/2 contamination  CXR neg  Other Stroke Risk Factors  ETOH use,  alcohol level <10, family reports he drinks regularly but not a problem.   Other Active Problems  Hypokalemia - 3.4 ->3.6   Code status - DNR  Hospital day # 10  [facility], MD Page: [facility]     To contact Stroke Continuity provider, please refer to Marisue Humble. After hours, contact General Neurology

## 2020-04-13 ENCOUNTER — Encounter (HOSPITAL_COMMUNITY): Payer: Self-pay | Admitting: Neurology

## 2020-04-13 DIAGNOSIS — I63511 Cerebral infarction due to unspecified occlusion or stenosis of right middle cerebral artery: Secondary | ICD-10-CM | POA: Diagnosis not present

## 2020-04-13 DIAGNOSIS — G936 Cerebral edema: Secondary | ICD-10-CM | POA: Diagnosis not present

## 2020-04-13 DIAGNOSIS — I619 Nontraumatic intracerebral hemorrhage, unspecified: Secondary | ICD-10-CM | POA: Diagnosis not present

## 2020-04-13 DIAGNOSIS — I639 Cerebral infarction, unspecified: Secondary | ICD-10-CM

## 2020-04-13 LAB — BASIC METABOLIC PANEL
Anion gap: 12 (ref 5–15)
BUN: 23 mg/dL (ref 8–23)
CO2: 25 mmol/L (ref 22–32)
Calcium: 9 mg/dL (ref 8.9–10.3)
Chloride: 108 mmol/L (ref 98–111)
Creatinine, Ser: 0.74 mg/dL (ref 0.61–1.24)
GFR, Estimated: >60 mL/min (ref >60)
Glucose, Bld: 127 mg/dL — ABNORMAL HIGH (ref 70–99)
Potassium: 3.5 mmol/L (ref 3.5–5.1)
Sodium: 145 mmol/L (ref 135–145)

## 2020-04-13 LAB — CULTURE, BLOOD (ROUTINE X 2): Culture: NO GROWTH

## 2020-04-13 NOTE — Progress Notes (Addendum)
STROKE TEAM PROGRESS NOTE   INTERVAL HISTORY No acute events Less somnolent this morning. Aroused more easily to verbal stimuli. Following commands in right hemibody. Denies pain. No visitors at bedside.  Vitals:   04/13/20 0500 04/13/20 0600 04/13/20 0800 04/13/20 1204  BP:   133/76 129/73  Pulse: 86 87 93 92  Resp: 16 15 18 15   Temp:   98.5 F (36.9 C) 97.6 F (36.4 C)  TempSrc:   Axillary Oral  SpO2: 96% 97% 94% 93%  Weight:      Height:       CBC:  Recent Labs  Lab 04/11/20 0327 04/12/20 0107  WBC 12.7* 10.6*  HGB 13.6 13.4  HCT 43.1 40.3  MCV 90.9 88.2  PLT 219 238   Basic Metabolic Panel:  Recent Labs  Lab 04/07/20 0320 04/07/20 1224 04/08/20 1102 04/08/20 1558 04/12/20 0107 04/13/20 0550  NA 160*   < > 160*   < > 147* 145  K 2.6*   < > 3.3*   < > 3.5 3.5  CL 122*   < > 125*   < > 114* 108  CO2 22   < > 25   < > 24 25  GLUCOSE 115*   < > 117*   < > 133* 127*  BUN 12   < > 34*   < > 27* 23  CREATININE 0.76   < > 1.20   < > 0.72 0.74  CALCIUM 10.0   < > 9.5   < > 8.9 9.0  MG 2.3  --  2.7*  --   --   --    < > = values in this interval not displayed.   Lipid Panel:  No results for input(s): CHOL, TRIG, HDL, CHOLHDL, VLDL, LDLCALC in the last 168 hours. HgbA1c:  No results for input(s): HGBA1C in the last 168 hours. Urine Drug Screen:  No results for input(s): LABOPIA, COCAINSCRNUR, LABBENZ, AMPHETMU, THCU, LABBARB in the last 168 hours.  Alcohol Level  No results for input(s): ETH in the last 168 hours.  IMAGING past 24 hours No results found.    PHYSICAL EXAM   Temp:  [97.5 F (36.4 C)-98.7 F (37.1 C)] 97.6 F (36.4 C) (02/27 1204) Pulse Rate:  [76-96] 92 (02/27 1204) Resp:  [10-18] 15 (02/27 1204) BP: (128-145)/(63-77) 129/73 (02/27 1204) SpO2:  [93 %-100 %] 93 % (02/27 1204)  General - Well nourished, well developed elderly Hispanic male, in no apparent distress.   Ophthalmologic - fundi not visualized due to noncooperation.  Neuro  -Somnolent, but less so today.  Keeps eyes closed but hold open briefly with verbal stimuli. Able to state name and answers simple question. Able to state he is in the hospital. Does not respond to further orientation questions. Verbalization is sparse with one-two word responses.  Keeps eyes closed.  Left visual neglect vs. Hemianopia. Right gaze, not able to cross midline. Right pupil 49mm and left 22mm, sluggish to light. Left facial droop. Left UE and LE mild withdraw to pain. RUE and RLE spontaneous movement against gravity. Sensation, coordination not cooperative and gait not tested.  ASSESSMENT/PLAN Gregory Osborn is a 75 y.o. male with a history of recent urologic issues including UTI (01/22), urinary retention with foley cath placed, hematuria (likely traumatic due to self cath) s/p cystoscopy 04/01/20 by Dr[facility] 04/03[facility]20 [facility]  showing BPH with recommended surgical treatment. Surgical history includes appendectomy.He had been doing well up until 11:25am when he was noted to  develop left hemiplegia. EMS was contacted, and took him to the [facility] ED where stroke workup was undertaken. He was noted to have left hemiplegia and neglect. NIHSS score 15.  He was not given tPA due to the hematuria. Transferred urgently to [facility] for thrombectomy which was aborted after multiple attempts which were unsuccessful.   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    MRI showed large right MCA infarct involving entire right MCA, consistent with malignant right MCA syndrome.  Small right SAH likely due to hemorrhagic conversion.    MRA head showed right M1 occluded.   CT repeat 2/23 right MCA large infarct with MLS 59mm  2D Echo EF 60-65%, No shunt   May consider 30 day cardiac event  monitoring as outpt to rule out afib  LDL 118  HgbA1c 5.6  VTE prophylaxis - heparin subq  No antithrombotics PTA, now on ASA 325mg   Therapy recommendations:  SNF  Disposition:  pending   Cerebral Edema  Critical care medicine team consulted.  Repeat HCT 2/19 shows worsening shift and edema, MLS at 35mm  CT repeat 2/23 right MCA large infarct with MLS 35mm  On 3% saline -> NS -> off  Na 160->157->160->153->154->151-> 147->145  Neurosurgery, Dr. 13m, consulted and advised patient is not a candidate for surgical intervention.   On Depakote for headache management.    Hypertensive urgency  Home meds:  Lisinopril 5mg  daily   BP stable now  Off cleviprex  On amlodipine 10  Off lisinopril due to AKI . BP goal < 160 given hemorrhagic conversion . Long-term BP goal normotensive  HLD   No statin prior to admission  LDL 118, goal < 70  On lipitor 40  Continue statin on discharge  Dysphagia   NPO after mental decline  Off IVF  Speech on board  On TF and FW   May need PEG for SNF placement  Fever AKI  Urinary retention d/t BPH present on admission  Tmax 100.6 -> afebrile   Cre 0.93->2.08->1.20->0.98->0.85->0.72  Developed urinary retention and UTI about a month ago necessitating trial of intermittent self cath (failed) and then subsequent foley placement  S/p cystoscopy 2/15 with recommended surgical intervention for BPH [facility]  Hematuria appears transient after attempts at self cath per urology notes review. Hematuria not present on on UA at admission.  Continue foley catheter, Monitor for hematuria. No signs of active bleeding. hgb stable. Foley output remains clear yellow.   UA WBC 11-20, but urine culture neg  Blood culture - 1/2 contamination  CXR neg  Other Stroke Risk Factors  ETOH use, alcohol level <10, family reports he drinks regularly but not a problem.   Other Active Problems  Hypokalemia - 3.4 ->3.6    Code status - DNR  Hospital day # 11  Electronically signed by:  [facility], MD Page: [facility] 04/14/2020, 8:31 PM    To contact Stroke Continuity provider, please refer to [phone]. After hours, contact General Neurology

## 2020-04-14 DIAGNOSIS — I6300 Cerebral infarction due to thrombosis of unspecified precerebral artery: Secondary | ICD-10-CM

## 2020-04-14 DIAGNOSIS — R1310 Dysphagia, unspecified: Secondary | ICD-10-CM

## 2020-04-14 DIAGNOSIS — E785 Hyperlipidemia, unspecified: Secondary | ICD-10-CM

## 2020-04-14 LAB — GLUCOSE, CAPILLARY
Glucose-Capillary: 112 mg/dL — ABNORMAL HIGH (ref 70–99)
Glucose-Capillary: 133 mg/dL — ABNORMAL HIGH (ref 70–99)
Glucose-Capillary: 111 mg/dL — ABNORMAL HIGH (ref 70–99)

## 2020-04-14 NOTE — Care Management Important Message (Signed)
Important Message  Patient Details  Name: Gregory Osborn MRN: [id number] Date of Birth: 23-Mar-1945   Medicare Important Message Given:  Yes     Dorena Bodo 04/14/2020, 12:59 PM

## 2020-04-14 NOTE — Progress Notes (Signed)
STROKE TEAM PROGRESS NOTE   INTERVAL HISTORY No acute events Somnolent at times. Opens eyes to tactile stimuli. Spontaneous movements on the right upper and lower extremities.  He does follow simple midline and one-step commands. Wife and son at bedside.  The son stated the patient was interactive yesterday and earlier today.  He complained of headache and dizziness.    Blood pressure adequately controlled.  Vitals:   04/13/20 2000 04/14/20 0000 04/14/20 0400 04/14/20 0803  BP: 136/77 (!) 144/92 137/71 (!) 151/73  Pulse:    90  Resp:    16  Temp: 98.6 F (37 C) 98.4 F (36.9 C) 98 F (36.7 C) 98.9 F (37.2 C)  TempSrc: Oral Oral Oral Axillary  SpO2:    98%  Weight:      Height:       CBC:  Recent Labs  Lab 04/11/20 0327 04/12/20 0107  WBC 12.7* 10.6*  HGB 13.6 13.4  HCT 43.1 40.3  MCV 90.9 88.2  PLT 219 238   Basic Metabolic Panel:  Recent Labs  Lab 04/08/20 1102 04/08/20 1558 04/12/20 0107 04/13/20 0550  NA 160*   < > 147* 145  K 3.3*   < > 3.5 3.5  CL 125*   < > 114* 108  CO2 25   < > 24 25  GLUCOSE 117*   < > 133* 127*  BUN 34*   < > 27* 23  CREATININE 1.20   < > 0.72 0.74  CALCIUM 9.5   < > 8.9 9.0  MG 2.7*  --   --   --    < > = values in this interval not displayed.    IMAGING past 24 hours No results found.    PHYSICAL EXAM   Temp:  [97.6 F (36.4 C)-98.9 F (37.2 C)] 98.9 F (37.2 C) (02/28 0803) Pulse Rate:  [88-92] 90 (02/28 0803) Resp:  [15-18] 16 (02/28 0803) BP: (129-151)/(65-92) 151/73 (02/28 0803) SpO2:  [93 %-98 %] 98 % (02/28 0803)  General - Well nourished, well developed elderly Hispanic male, in no apparent distress.   Ophthalmologic - fundi not visualized due to noncooperation.  Neuro -drowsy but can be aroused easily..  Briefly opens eyes with verbal stimuli. Did not answer any orientation questions today.  Will follow midline and one-step commands.  Verbalization is sparse with one-two word responses per son.  Left visual  neglect vs. Hemianopia. Right gaze, not able to cross midline. Right pupil 95mm and left 68mm, sluggish to light. Left facial droop. Left UE and LE mild withdraw to pain. RUE and RLE spontaneous movement against gravity. Sensation, coordination not cooperative and gait not tested.  ASSESSMENT/PLAN Beni Turrell a 75 y.o.malewith a history of recent urologic issues including UTI (01/22), urinary retention with foley cath placed, hematuria (likely traumatic due to self cath) s/p cystoscopy 04/01/20 by Dr. Pete Glatter [facility])  showing BPH with recommended surgical treatment. Surgical history includes appendectomy.He had been doing well up until 11:25am when he was noted to develop left hemiplegia. EMS was contacted, and took him to the [facility] ED where stroke workup was undertaken. He was noted to have left hemiplegia and neglect. NIHSS score 15.  He was not given tPA due to the hematuria. Transferred urgently to [facility] for thrombectomy which was aborted after multiple attempts which were unsuccessful.   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    MRI showed large right MCA infarct involving entire right MCA, consistent with malignant right MCA syndrome.  Small right SAH likely due to hemorrhagic conversion.    MRA head showed right M1 occluded.   CT repeat 2/23 right MCA large infarct with MLS 14mm  2D Echo EF 60-65%, No shunt   May consider 30 day cardiac event monitoring as outpt to rule out afib  LDL 118  HgbA1c 5.6  VTE prophylaxis - heparin subq  No antithrombotics PTA, now on ASA 325mg   Therapy recommendations:  SNF  Disposition:  pending   Cerebral Edema  Critical care medicine team consulted.  Repeat HCT 2/19 shows worsening shift and edema, MLS at  40mm  CT repeat 2/23 right MCA large infarct with MLS 73mm  On 3% saline -> NS -> off  Na 160->157->160->153->154->151-> 147->145  Neurosurgery, Dr. 13m, consulted and advised patient is not a candidate for surgical intervention.   Headache  Depakote d/c today as this may be contributing to somnolence/encephalopathy.    If headache persists will try Topamax  Hypertensive urgency  Home meds:  Lisinopril 5mg  daily   BP stable now  On amlodipine 10  Off lisinopril due to AKI . BP goal < 160 given hemorrhagic conversion . Long-term BP goal normotensive  HLD   No statin prior to admission  LDL 118, goal < 70  On lipitor 40mg  daily  Continue statin on discharge  Dysphagia   NPO after neurological decline  Reconsult speech for possible swallow evaluation, MBS  May need PEG for SNF placement  On TF and FW, Nutrition team following  Fever AKI  Urinary retention d/t BPH present on admission  Tmax 100.6 -> afebrile   Creatinine 0.93->2.08->0.74, check level in am  Developed urinary retention and UTI about a month ago necessitating trial of intermittent self cath (failed) and then subsequent foley placement  S/p cystoscopy 2/15 with recommended surgical intervention for BPH [facility]  Hematuria appears transient after attempts at self cath per urology notes review. Hematuria not present on UA at admission.  Continue foley catheter, Monitor for hematuria. No signs of active bleeding. hgb stable. Foley output remains clear yellow.   UA WBC 11-20, but urine culture neg  Blood culture - 1/2 contamination  CXR neg  Other Stroke Risk Factors  ETOH use, alcohol level <10, family reports he drinks regularly but not CBC and BMP in a.m.  Long discussion with the patient's son and wife at the bedside and answered questions.  Greater than 50% time during the 35-minute visit was spent on counseling and coordination of care about the stroke and  dysphagia answering questions problem.   Other Active Problems  Hypokalemia - 3.4 ->3.5  Code status - DNR  Hospital day # 12  I have personally obtained history,examined this patient, reviewed notes, independently viewed imaging studies, participated in medical decision making and plan of care.ROS completed by me personally and pertinent positives fully documented  I have made any additions or clarifications directly to the above note. Agree with note above.  Plan mobilize out of bed.  Continue ongoing therapies.  Speech therapy for swallow eval and if patient fails may consider PEG tube later this week.  Long discussion with the patient's son and wife at the bedside and answered questions.  Greater than 50% time during this 35-minute visit was spent on counseling  and coordination of care and discussion with care team.  Delia Heady, MD Medical Director [facility] Pager: [phone] 04/14/2020 3:52 PM     To contact Stroke Continuity provider, please refer to [email or web address]. After hours, contact General Neurology

## 2020-04-14 NOTE — Progress Notes (Signed)
Physical Therapy Treatment Patient Details Name: Gregory Osborn MRN: [id number] DOB: 1945/04/07 Today's Date: 04/14/2020    History of Present Illness 75 yo male admmitted to [facility] ED on 2/16 with L hemiplegia and facial drop, imaging reveals R M1 occlusion with attempted thrombectomy (unsuccessful). Small R SAH, likely due to hemorrhagic conversion. ETT 2/16-present. PMH includes HTN, urinary retention with frequent UTIs, BPH, hematuria.    PT Comments    The pt was eager to participate in therapy session with focus on continued progression of OOB movement and LE strengthening. The pt was able to complete bed mobility and sitting EOB with maxA of 2 and sequential cues for each movement. He continues to require maxA to maintain sitting EOB due to strong pushing with RUE towards L-side. The pt was able to practice multiple bouts of lateral leaning to R and pushing back to midline with assist, and was able to progress to attempting multiple sit-stand from EOB with maxA of 2 and blocking of bilateral knees for support. The pt expressed frustration with the challenge of standing, but is highly motivated and asking to continue with standing attempts. The pt continues to have some difficulty with consistent following of commands which may be due to poor motor coordination, motor planning, language barrier, or cognition. Will continue to assess with further attempts at mobility.     Follow Up Recommendations  SNF     Equipment Recommendations  Rolling walker with 5" wheels;Wheelchair (measurements PT);Wheelchair cushion (measurements PT);Hospital bed    Recommendations for Other Services       Precautions / Restrictions Precautions Precautions: Fall Precaution Comments: L hemiplegia, L inattention Restrictions Weight Bearing Restrictions: No    Mobility  Bed Mobility Overal bed mobility: Needs Assistance Bed Mobility: Rolling;Supine to Sit Rolling: Max assist;+2 for safety/equipment    Supine to sit: Max assist;+2 for physical assistance;+2 for safety/equipment Sit to supine: Max assist;+2 for physical assistance   General bed mobility comments: maxA with movement of each extremitiy and maxA to elevate trunk from [facility] with assist of 2 for safety. maxA to maintain seated balance    Transfers Overall transfer level: Needs assistance Equipment used: 2 person hand held assist Transfers: Sit to/from Stand;Lateral/Scoot Transfers Sit to Stand: Max assist;+2 physical assistance        Lateral/Scoot Transfers: Total assist;+2 safety/equipment;+2 physical assistance General transfer comment: maxA to power up with bilateral support, blocking of bilateral knees, and facilitation at hips to achieve full stand. Pt able to maintain for 3-5 seconds. maxA to lateral scoot with use of bed pad along EOB  Ambulation/Gait             General Gait Details: deferred    Modified Rankin (Stroke Patients Only) Modified Rankin (Stroke Patients Only) Pre-Morbid Rankin Score: No symptoms Modified Rankin: Severe disability     Balance Overall balance assessment: Needs assistance Sitting-balance support: No upper extremity supported;Feet supported Sitting balance-Leahy Scale: Zero Sitting balance - Comments: mod/maxA to maintain static sitting throuhgout. pt able to tolerate down onto R elbow and push to sitting, but unable to use RUE to stabilize. Postural control: Posterior lean                                  Cognition Arousal/Alertness: Awake/alert Behavior During Therapy: WFL for tasks assessed/performed;Flat affect Overall Cognitive Status: Impaired/Different from baseline Area of Impairment: Orientation;Attention;Memory;Following commands;Safety/judgement;Awareness;Problem solving  Orientation Level: Disoriented to;Situation;Time Current Attention Level: Sustained Memory: Decreased recall of precautions;Decreased short-term  memory Following Commands: Follows one step commands inconsistently;Follows one step commands with increased time Safety/Judgement: Decreased awareness of deficits;Decreased awareness of safety Awareness: Intellectual Problem Solving: Slow processing;Decreased initiation;Difficulty sequencing;Requires tactile cues;Requires verbal cues General Comments: Pt with improved alertness, able to follow simple commands but limited by inattention and neglect that requires increased multimodal cues and physical assist to attend/follow commands. no active command following with L extremities.      Exercises General Exercises - Lower Extremity Ankle Circles/Pumps: AAROM;Right;PROM;Left;10 reps;Supine Short Arc Quad: AROM;Right;PROM;Left;10 reps;Supine Other Exercises Other Exercises: Seated balance interventions: L and R elbow propping with return to midline, thoracic extension    General Comments General comments (skin integrity, edema, etc.): VSS on RA. R gaze preference with L inattention, pt able to achieve full AROM of neck to L but requires increased cueing and light tactile cues to complete      Pertinent Vitals/Pain Pain Assessment: Faces Faces Pain Scale: Hurts even more Pain Location: L chest when standing Pain Descriptors / Indicators: Grimacing Pain Intervention(s): Monitored during session;Repositioned           PT Goals (current goals can now be found in the care plan section) Acute Rehab PT Goals Patient Stated Goal: none stated PT Goal Formulation: Patient unable to participate in goal setting Time For Goal Achievement: 04/21/20 Potential to Achieve Goals: Fair Progress towards PT goals: Progressing toward goals    Frequency    Min 3X/week      PT Plan Current plan remains appropriate       AM-PAC PT "6 Clicks" Mobility   Outcome Measure  Help needed turning from your back to your side while in a flat bed without using bedrails?: Total Help needed moving from  lying on your back to sitting on the side of a flat bed without using bedrails?: Total Help needed moving to and from a bed to a chair (including a wheelchair)?: Total Help needed standing up from a chair using your arms (e.g., wheelchair or bedside chair)?: Total Help needed to walk in hospital room?: Total Help needed climbing 3-5 steps with a railing? : Total 6 Click Score: 6    End of Session Equipment Utilized During Treatment: Gait belt Activity Tolerance: Patient limited by fatigue Patient left: in bed;with call bell/phone within reach;with bed alarm set;with family/visitor present Nurse Communication: Mobility status;Need for lift equipment PT Visit Diagnosis: Other abnormalities of gait and mobility (R26.89);Hemiplegia and hemiparesis Hemiplegia - Right/Left: Left Hemiplegia - dominant/non-dominant: Dominant Hemiplegia - caused by: Cerebral infarction     Time: 8657-8469 PT Time Calculation (min) (ACUTE ONLY): 48 min  Charges:  $Gait Training: 8-22 mins $Therapeutic Exercise: 8-22 mins $Therapeutic Activity: 8-22 mins                     Rolm Baptise, PT, DPT   Acute Rehabilitation Department Pager #: [phone]   Gaetana Michaelis 04/14/2020, 4:52 PM

## 2020-04-15 ENCOUNTER — Inpatient Hospital Stay (HOSPITAL_COMMUNITY): Payer: Medicare HMO

## 2020-04-15 DIAGNOSIS — I6300 Cerebral infarction due to thrombosis of unspecified precerebral artery: Secondary | ICD-10-CM | POA: Diagnosis not present

## 2020-04-15 LAB — CBC
HCT: 38.5 % — ABNORMAL LOW (ref 39.0–52.0)
Hemoglobin: 13.1 g/dL (ref 13.0–17.0)
MCH: 29.5 pg (ref 26.0–34.0)
MCHC: 34 g/dL (ref 30.0–36.0)
MCV: 86.7 fL (ref 80.0–100.0)
Platelets: 280 10*3/uL (ref 150–400)
RBC: 4.44 MIL/uL (ref 4.22–5.81)
RDW: 12.3 % (ref 11.5–15.5)
WBC: 12.1 10*3/uL — ABNORMAL HIGH (ref 4.0–10.5)
nRBC: 0 % (ref 0.0–0.2)

## 2020-04-15 LAB — BASIC METABOLIC PANEL
Anion gap: 10 (ref 5–15)
BUN: 25 mg/dL — ABNORMAL HIGH (ref 8–23)
CO2: 25 mmol/L (ref 22–32)
Calcium: 8.9 mg/dL (ref 8.9–10.3)
Chloride: 103 mmol/L (ref 98–111)
Creatinine, Ser: 0.72 mg/dL (ref 0.61–1.24)
GFR, Estimated: >60 mL/min (ref >60)
Glucose, Bld: 101 mg/dL — ABNORMAL HIGH (ref 70–99)
Potassium: 3.8 mmol/L (ref 3.5–5.1)
Sodium: 138 mmol/L (ref 135–145)

## 2020-04-15 LAB — GLUCOSE, CAPILLARY: Glucose-Capillary: 99 mg/dL (ref 70–99)

## 2020-04-15 NOTE — Progress Notes (Signed)
Occupational Therapy Treatment Patient Details Name: Gregory Osborn MRN: [id number] DOB: October 05, 1945 Today's Date: 04/15/2020    History of present illness 75 yo male admmitted to [facility] ED on 2/16 with L hemiplegia and facial drop, imaging reveals R M1 occlusion with attempted thrombectomy (unsuccessful). Small R SAH, likely due to hemorrhagic conversion. ETT 2/16-present. PMH includes HTN, urinary retention with frequent UTIs, BPH, hematuria.   OT comments  Pt continues to present with left inattention, poor sitting balance, pushing to L, decreased cognition, vision deficits, and decreased functional use of LUE. Pt tolerating sitting at EOB for ~15 minutes with Mod-Max A for sitting balance. Increased cue to decrease pushing to L. Max cues for tracking to midline. Continue to recommend dc to SNF and will continue to follow acutely as admitted.    Follow Up Recommendations  SNF    Equipment Recommendations  Other (comment) (Defer to next venue)    Recommendations for Other Services PT consult;Speech consult    Precautions / Restrictions Precautions Precautions: Fall Precaution Comments: L hemiplegia, L inattention       Mobility Bed Mobility Overal bed mobility: Needs Assistance Bed Mobility: Supine to Sit;Sit to Supine     Supine to sit: Max assist;+2 for physical assistance Sit to supine: Max assist;+2 for physical assistance   General bed mobility comments: Max A +2 for bringing BLEs towards EOB and then elevate trunk. Max A for return to supine.    Transfers                 General transfer comment: Defered for safety today    Balance Overall balance assessment: Needs assistance Sitting-balance support: No upper extremity supported;Feet supported Sitting balance-Leahy Scale: Poor Sitting balance - Comments: Mod-Max A for sitting balance. Quickly changes to pushing to L and requiring Max A for correction.                                   ADL  either performed or assessed with clinical judgement   ADL Overall ADL's : Needs assistance/impaired                     Lower Body Dressing: Total assistance;Bed level Lower Body Dressing Details (indicate cue type and reason): Don socks               General ADL Comments: Focused session on sitting balance at EOB, awareness of L side, and cognition.     Vision   Vision Assessment?: Vision impaired- to be further tested in functional context Additional Comments: Difficult due to poor following of commands. R head turn and gaze. Neglect/inattention to R. Poor tracking and difficulty tracking to midline   Perception     Praxis      Cognition Arousal/Alertness: Awake/alert Behavior During Therapy: WFL for tasks assessed/performed;Flat affect Overall Cognitive Status: Impaired/Different from baseline Area of Impairment: Orientation;Attention;Memory;Following commands;Safety/judgement;Awareness;Problem solving                 Orientation Level: Disoriented to;Place (Stating "home" and then hosital. Reporting it is march. And he is at the hositpal for cardiovascular event) Current Attention Level: Sustained;Focused Memory: Decreased recall of precautions;Decreased short-term memory Following Commands: Follows one step commands inconsistently;Follows one step commands with increased time Safety/Judgement: Decreased awareness of safety;Decreased awareness of deficits Awareness: Intellectual Problem Solving: Slow processing;Decreased initiation;Difficulty sequencing;Requires tactile cues;Requires verbal cues General Comments: Pt reporting he is at home, correcting  to hospital with cues. Pt very poor awareness of L environement and side of body. Pt requiring Max cues for postural correction and to decrease pushing. Following one step commands with increased cues and time.        Exercises     Shoulder Instructions       General Comments VSS on RA    Pertinent  Vitals/ Pain       Pain Assessment: Faces Faces Pain Scale: Hurts little more Pain Location: BLEs Pain Descriptors / Indicators: Grimacing Pain Intervention(s): Monitored during session  Home Living                                          Prior Functioning/Environment              Frequency  Min 2X/week        Progress Toward Goals  OT Goals(current goals can now be found in the care plan section)  Progress towards OT goals: Progressing toward goals  Acute Rehab OT Goals Patient Stated Goal: none stated OT Goal Formulation: Patient unable to participate in goal setting Time For Goal Achievement: 04/21/20 Potential to Achieve Goals: Fair ADL Goals Pt Will Perform Grooming: sitting;with min assist Pt Will Perform Upper Body Bathing: with min assist;sitting Pt Will Transfer to Toilet: with mod assist;with +2 assist;stand pivot transfer;bedside commode Additional ADL Goal #1: Pt will locate 2/3 ADL items in L visual field with Mod cues Additional ADL Goal #2: Pt will maintain sitting balance with Min A in preparation for ADLs Additional ADL Goal #3: Pt will follow one step commands during ADLs with Min cues  Plan Discharge plan remains appropriate    Co-evaluation    PT/OT/SLP Co-Evaluation/Treatment: Yes Reason for Co-Treatment: Necessary to address cognition/behavior during functional activity;For patient/therapist safety;To address functional/ADL transfers;Complexity of the patient's impairments (multi-system involvement)   OT goals addressed during session: ADL's and self-care      AM-PAC OT "6 Clicks" Daily Activity     Outcome Measure   Help from another person eating meals?: Total Help from another person taking care of personal grooming?: A Lot Help from another person toileting, which includes using toliet, bedpan, or urinal?: Total Help from another person bathing (including washing, rinsing, drying)?: Total Help from another person  to put on and taking off regular upper body clothing?: A Lot Help from another person to put on and taking off regular lower body clothing?: Total 6 Click Score: 8    End of Session Equipment Utilized During Treatment: Gait belt  OT Visit Diagnosis: Unsteadiness on feet (R26.81);Other abnormalities of gait and mobility (R26.89);Muscle weakness (generalized) (M62.81);Pain;Hemiplegia and hemiparesis;Low vision, both eyes (H54.2);Other symptoms and signs involving cognitive function Hemiplegia - Right/Left: Right Hemiplegia - caused by: Cerebral infarction   Activity Tolerance Patient tolerated treatment well   Patient Left in bed;with call bell/phone within reach;with restraints reapplied;with bed alarm set   Nurse Communication Mobility status;Precautions        Time: 1093-2355 OT Time Calculation (min): 27 min  Charges: OT General Charges $OT Visit: 1 Visit OT Treatments $Therapeutic Activity: 8-22 mins  Jaeden Westbay MSOT, OTR/L Acute Rehab Pager: [phone] Office: [phone]   Theodoro Grist Cori Justus 04/15/2020, 4:08 PM

## 2020-04-15 NOTE — Progress Notes (Signed)
STROKE TEAM PROGRESS NOTE   INTERVAL HISTORY No acute events  Awake and interactive this morning. Dysarthric, right gaze preference, left field cut, left sided neglect,.  Left dense hemiplegia.  Good strength on the right.  Wife and daughter at bedside.  Updated them on the patient's condition.  He complained of bilateral feet pain.  There is an area of tenderness to touch on the right foot.  Patient denies gout history.  Will order tylenol and continue to monitor.  Afebrile.  Blood pressure adequately controlled.  Vitals:   04/14/20 1943 04/14/20 2316 04/15/20 0309 04/15/20 0739  BP: 126/71 126/69 127/73 (!) 145/84  Pulse: 80 82 93 95  Resp: 17 16 18 18   Temp: 98.1 F (36.7 C) 99.1 F (37.3 C) 98.7 F (37.1 C) (!) 87 F (30.6 C)  TempSrc: Axillary Axillary Axillary   SpO2: 98% 98% 98% 95%  Weight:      Height:       CBC:  Recent Labs  Lab 04/12/20 0107 04/15/20 0227  WBC 10.6* 12.1*  HGB 13.4 13.1  HCT 40.3 38.5*  MCV 88.2 86.7  PLT 238 280   Basic Metabolic Panel:  Recent Labs  Lab 04/08/20 1102 04/08/20 1558 04/13/20 0550 04/15/20 0227  NA 160*   < > 145 138  K 3.3*   < > 3.5 3.8  CL 125*   < > 108 103  CO2 25   < > 25 25  GLUCOSE 117*   < > 127* 101*  BUN 34*   < > 23 25*  CREATININE 1.20   < > 0.74 0.72  CALCIUM 9.5   < > 9.0 8.9  MG 2.7*  --   --   --    < > = values in this interval not displayed.    IMAGING past 24 hours No results found.    PHYSICAL EXAM   Temp:  [87 F (30.6 C)-99.1 F (37.3 C)] 87 F (30.6 C) (03/01 0739) Pulse Rate:  [80-95] 95 (03/01 0739) Resp:  [15-18] 18 (03/01 0739) BP: (122-151)/(69-84) 145/84 (03/01 0739) SpO2:  [95 %-100 %] 95 % (03/01 0739)  General - Well nourished, well developed elderly Hispanic male, in no apparent distress.   Ophthalmologic - fundi not visualized due to noncooperation.  Neuro - easily arousable.  Answers orientation questions appropriately with mild dysarthria.  Fluent speech.  Left  visual neglect vs. Hemianopia. Right gaze, not able to cross midline. Right pupil 51mm and left 48mm, sluggish to light. Left facial droop. Left UE and LE mild withdraw to pain. RUE and RLE follows commands 4/5 strength throughout.  Sensation and coordination diminished on the left and gait not tested.  ASSESSMENT/PLAN Eulas Schweitzer a 75 y.o.malewith a history of recent urologic issues including UTI (01/22), urinary retention with foley cath placed, hematuria (likely traumatic due to self cath) s/p cystoscopy 04/01/20 by Dr. 04/03/20 [facility])  showing BPH with recommended surgical treatment. Surgical history includes appendectomy.He had been doing well up until 11:25am when he was noted to develop left hemiplegia. EMS was contacted, and took him to the [facility] ED where stroke workup was undertaken. He was noted to have left hemiplegia and neglect. NIHSS score 15.  He was not given tPA due to the hematuria. Transferred urgently to [facility] for thrombectomy which was aborted after multiple attempts which were unsuccessful.   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    MRI showed large right MCA infarct involving entire right MCA, consistent with malignant right MCA syndrome.  Small right SAH likely due to hemorrhagic conversion.    MRA head showed right M1 occluded.   CT repeat 2/23 right MCA large infarct with MLS 53mm  2D Echo EF 60-65%, No shunt   May consider 30 day cardiac event monitoring as outpt to rule out afib  LDL 118  HgbA1c 5.6  VTE prophylaxis - heparin subq  No antithrombotics PTA, now on ASA 325mg   Therapy recommendations:  SNF  Disposition:  pending   Cerebral Edema  Critical care medicine team consulted.  Repeat HCT 2/19 shows worsening shift  and edema, MLS at 36mm  CT repeat 2/23 right MCA large infarct with MLS 24mm  On 3% saline -> NS -> off  Na 160->157->160->153->154->151-> 147->145  Neurosurgery, Dr. 13m, consulted and advised patient is not a candidate for surgical intervention.   Headache  Depakote d/c today as this may be contributing to somnolence/encephalopathy.    If headache persists will try Topamax  Hypertensive urgency  Home meds:  Lisinopril 5mg  daily   BP stable now  On amlodipine 10  Off lisinopril due to AKI . BP goal < 160 given hemorrhagic conversion . Long-term BP goal normotensive  HLD   No statin prior to admission  LDL 118, goal < 70  On lipitor 40mg  daily  Continue statin on discharge  Dysphagia   NPO after neurological decline  Swallow evaluation today.  Recommend NPO except meds.    May need PEG for SNF placement, continue cortrak for now  On TF and FW, Nutrition team following   AKI  Urinary retention d/t BPH present on admission  Creatinine 0.93->2.08->0.74->.72  Developed urinary retention and UTI about a month ago necessitating trial of intermittent self cath (failed) and then subsequent foley placement  S/p cystoscopy 2/15 with recommended surgical intervention for BPH by Dr. 09-14-1990 [facility])  Hematuria appears transient after attempts at self cath per urology notes review. Hematuria not present on UA at admission.  Continue foley catheter, Monitor for hematuria. No signs of active bleeding. hgb stable. Foley output remains clear yellow.   UA WBC 11-20, but urine culture neg  Blood culture - 1/2 contamination  CXR neg  Fever  Tmax 99.1 -> afebrile  WBC 12.1, repeat tomorrow  Pain  Bilateral feet pain  No calf tenderness  Tylenol prn  Other Stroke Risk Factors  ETOH use, alcohol level <10, family reports he drinks regularly but not CBC and BMP in a.m.  Long discussion with the patient's son and wife at the bedside and answered  questions.  Greater than 50% time during the 35-minute visit was spent on counseling and coordination of care about the stroke and dysphagia answering questions problem.   Other Active Problems  Hypokalemia - 3.4 ->3.5->3.8  Code status - DNR  Hospital day # 13  I have personally obtained history,examined this patient, reviewed notes, independently viewed imaging studies, participated in medical decision making and plan of care.ROS completed by me personally and pertinent positives fully documented  I have made any additions or clarifications directly to the above note. Agree with note above.  Patient is complaining of some nonspecific leg pain.  Will try Tylenol to see if it helps.  Mobilize out of bed.  Continue ongoing therapies.  Await  rehab bed.  Medically stable to transfer to rehab.  Discussed with patient, wife and daughter and answered questions.  Greater than 50% time during this 25-minute visit was spent counseling and coordination of care and discussion of care  Delia Heady, MD Medical Director [facility] Pager: [phone] 04/15/2020 2:53 PM     To contact Stroke Continuity provider, please refer to [email or web address]. After hours, contact General Neurology

## 2020-04-15 NOTE — Progress Notes (Signed)
Physical Therapy Treatment Patient Details Name: Gregory Osborn MRN: [id number] DOB: 1945/05/21 Today's Date: 04/15/2020    History of Present Illness 75 yo male admmitted to [place] ED on 2/16 with L hemiplegia and facial drop, imaging reveals R M1 occlusion with attempted thrombectomy (unsuccessful). Small R SAH, likely due to hemorrhagic conversion. ETT 2/16-present. PMH includes HTN, urinary retention with frequent UTIs, BPH, hematuria.    PT Comments    Patient progressing slowly towards PT goals. Requires max A of 2 for bed mobility with step by step cues for sequencing/technique. Worked on sitting balance, upright posture/activating spinal extensors, finding midline and attended to left environment/LUE. Right pusher when RUE is not supported on lap. Easily distracted and wanting NG tube out. Oriented to self and place with options as well as month. Poor awareness of left neglect/deficits. Able to follow simple commands well today. Tolerated sitting EOB ~15 minutes. Continues to be appropriate for SNF. Will follow.    Follow Up Recommendations  SNF     Equipment Recommendations  Rolling walker with 5" wheels;Wheelchair (measurements PT);Wheelchair cushion (measurements PT);Hospital bed    Recommendations for Other Services       Precautions / Restrictions Precautions Precautions: Fall Precaution Comments: L hemiplegia, L inattention Restrictions Weight Bearing Restrictions: No    Mobility  Bed Mobility Overal bed mobility: Needs Assistance Bed Mobility: Supine to Sit;Sit to Supine     Supine to sit: Max assist;+2 for physical assistance Sit to supine: Max assist;+2 for physical assistance   General bed mobility comments: Max A +2 for bringing BLEs towards EOB and then elevate trunk. Max A for return to supine. Right pusher.    Transfers                 General transfer comment: Defered for safety today  Ambulation/Gait                 Stairs              Wheelchair Mobility    Modified Rankin (Stroke Patients Only) Modified Rankin (Stroke Patients Only) Pre-Morbid Rankin Score: No symptoms Modified Rankin: Severe disability     Balance Overall balance assessment: Needs assistance Sitting-balance support: No upper extremity supported;Feet supported Sitting balance-Leahy Scale: Poor Sitting balance - Comments: Mod-Max A for sitting balance. Quickly changes to pushing to Lft and requiring Max A for correction. Worked on midline, upright posture and activating thoracic/lumbar extension. Able to stabilize self with RUE on bed rail Postural control: Posterior lean;Left lateral lean (due to pushing)                                  Cognition Arousal/Alertness: Awake/alert Behavior During Therapy: WFL for tasks assessed/performed;Flat affect Overall Cognitive Status: Impaired/Different from baseline Area of Impairment: Orientation;Attention;Memory;Following commands;Safety/judgement;Awareness;Problem solving                 Orientation Level: Disoriented to;Place Current Attention Level: Sustained;Focused Memory: Decreased recall of precautions;Decreased short-term memory Following Commands: Follows one step commands inconsistently;Follows one step commands with increased time Safety/Judgement: Decreased awareness of safety;Decreased awareness of deficits Awareness: Intellectual Problem Solving: Slow processing;Decreased initiation;Difficulty sequencing;Requires tactile cues;Requires verbal cues General Comments: Pt reporting he is at home, correcting to hospital with cues. Pt very poor awareness of L environement and side of body. Pt requiring Max cues for postural correction and to decrease pushing. Following one step commands with increased cues and time. Very  distractable. Fidgeting at lines/blankets/NG tube      Exercises Other Exercises Other Exercises: Worked on shoulder shrugs, propping on elbows  bilaterally and retun to midline, attended to left environment, LUE.    General Comments General comments (skin integrity, edema, etc.): VSS on RA.      Pertinent Vitals/Pain Pain Assessment: Faces Faces Pain Scale: Hurts little more Pain Location: BLEs Pain Descriptors / Indicators: Grimacing Pain Intervention(s): Monitored during session    Home Living                      Prior Function            PT Goals (current goals can now be found in the care plan section) Acute Rehab PT Goals Patient Stated Goal: none stated Progress towards PT goals: Progressing toward goals (slowly)    Frequency    Min 3X/week      PT Plan Current plan remains appropriate    Co-evaluation PT/OT/SLP Co-Evaluation/Treatment: Yes Reason for Co-Treatment: Complexity of the patient's impairments (multi-system involvement);For patient/therapist safety;To address functional/ADL transfers;Necessary to address cognition/behavior during functional activity PT goals addressed during session: Mobility/safety with mobility;Balance;Strengthening/ROM OT goals addressed during session: ADL's and self-care      AM-PAC PT "6 Clicks" Mobility   Outcome Measure  Help needed turning from your back to your side while in a flat bed without using bedrails?: Total Help needed moving from lying on your back to sitting on the side of a flat bed without using bedrails?: Total Help needed moving to and from a bed to a chair (including a wheelchair)?: Total Help needed standing up from a chair using your arms (e.g., wheelchair or bedside chair)?: Total Help needed to walk in hospital room?: Total Help needed climbing 3-5 steps with a railing? : Total 6 Click Score: 6    End of Session   Activity Tolerance: Patient tolerated treatment well Patient left: in bed;with call bell/phone within reach;with bed alarm set Nurse Communication: Mobility status;Need for lift equipment PT Visit Diagnosis: Other  abnormalities of gait and mobility (R26.89);Hemiplegia and hemiparesis Hemiplegia - Right/Left: Left Hemiplegia - dominant/non-dominant: Dominant Hemiplegia - caused by: Cerebral infarction     Time: 5027-7412 PT Time Calculation (min) (ACUTE ONLY): 27 min  Charges:  $Therapeutic Activity: 8-22 mins                     Vale Haven, PT, DPT Acute Rehabilitation Services Pager [phone] Office [phone]       Blake Divine A Lanier Ensign 04/15/2020, 4:22 PM

## 2020-04-15 NOTE — Progress Notes (Addendum)
Modified Barium Swallow Progress Note  Patient Details  Name: Gregory Osborn MRN: [id number] Date of Birth: 11-03-1945  Today's Date: 04/15/2020  Modified Barium Swallow completed.  Full report located under Chart Review in the Imaging Section.  Brief recommendations include the following:  Clinical Impression  Patient presents with a moderate oropharyngeal dysphagia largely impacted by cognitive deficits. Patient alert and pleasant however presented with significant difficulty sustaining attention, highly distracted requiring max verbal, visual, and tactile cueing to attend to task. Severe left sided lean/push, grabbing for objects in attempts to stabalize self. Overall, swallowing physiology largely intact with what appears to be mild-mod oral weakness and mild laryhgeal/pharyngeal weakness with decreased anterior laryngeal movement, mildly decreased epiglottic deflection, and mildly decreased UES relaxation resltuing in only trace-min pharyngeal residuals post swallow and penetration, at times deep to the vocal cords, during the swallow without sensation. Cognitive deficits however result in poor attention to bolus, severe oral holding and decreased posterior propulsion with eventual anterior labial spillage and premature spillage/delayed swallow initiation both before and after the swallow (oral residuals). Patient demonstrating limited intellectual awareness of deficits and poor attention significantly impacting ability to follow directions in attempt to compensate with resultant ongoing penetration of residuals.  Oral phase most timely with thin liquids via straw howver penetation continues. SLP feels strongly that at this time, cognition is primary limiting factor to initiation of a full po diet. Recommend NPO with water protocol (sips and chips after oral care only). SLP will continue to work with patient. It does appear that based on review of notes that alertness and cognition is improving.  Prognosis for ability to advance diet good wtih continued improvement. SLP will continue to f/u at bedside with focus on patient's ability to attend to pos and utilize timely oral transit of bolus and initiation of pharyngeal swallow as an indicator for ability to participate in repeat MBS and/or advance diet.   Interpreter utilized: [id number]   Swallow Evaluation Recommendations       SLP Diet Recommendations: Free water protocol after oral care;NPO;NPO except meds       Medication Administration: Crushed with puree   Supervision: Staff to assist with self feeding;Full supervision/cueing for compensatory strategies   Compensations: Lingual sweep for clearance of pocketing;Minimize environmental distractions;Monitor for anterior loss   Postural Changes: Seated upright at 90 degrees   Oral Care Recommendations: Oral care before and after PO;Oral care QID   Other Recommendations: Have oral suction available  Fleurette Woolbright MA, CCC-SLP    Gregory Osborn 04/15/2020,10:42 AM

## 2020-04-16 ENCOUNTER — Inpatient Hospital Stay (HOSPITAL_COMMUNITY): Payer: Medicare HMO

## 2020-04-16 DIAGNOSIS — I6300 Cerebral infarction due to thrombosis of unspecified precerebral artery: Secondary | ICD-10-CM | POA: Diagnosis not present

## 2020-04-16 IMAGING — DX DG ANKLE 2V *R*
1 series · 2 of 2 positions shown · non-contrast
Comparison: No prior.

CLINICAL DATA: Ankle pain.

EXAM:
RIGHT ANKLE - 2 VIEW

[Series 1: ankle · 0.14mm/px · 2 of 2 slices shown]
[im 1/2]
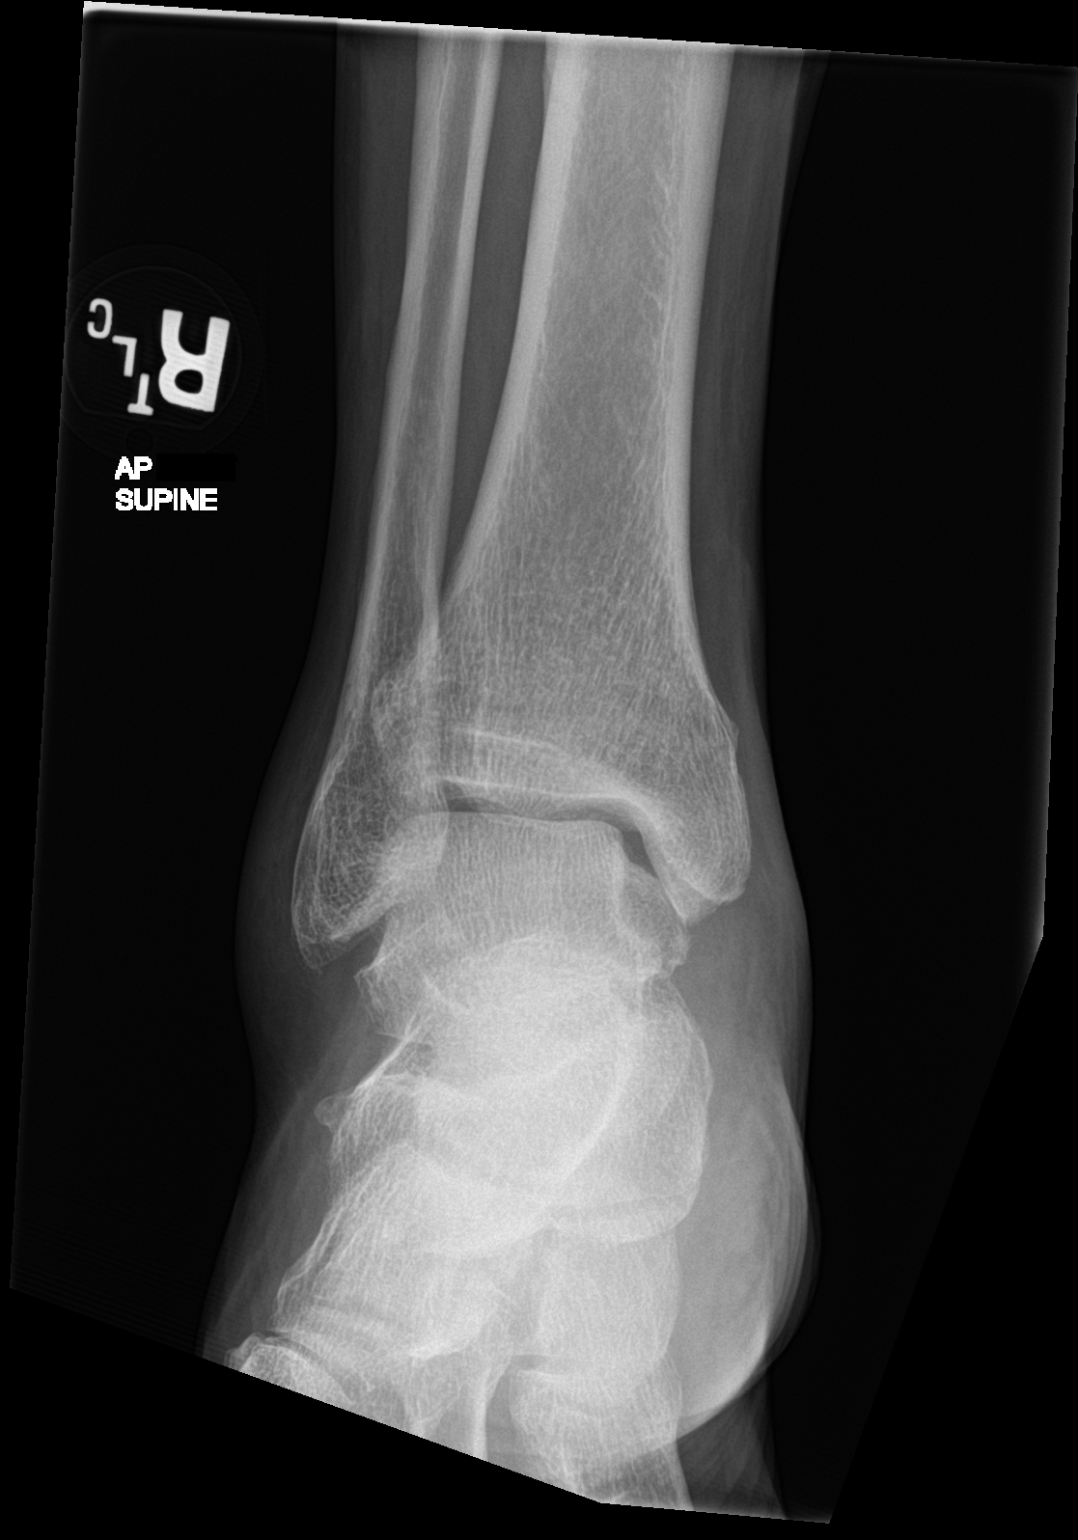
[im 2/2]
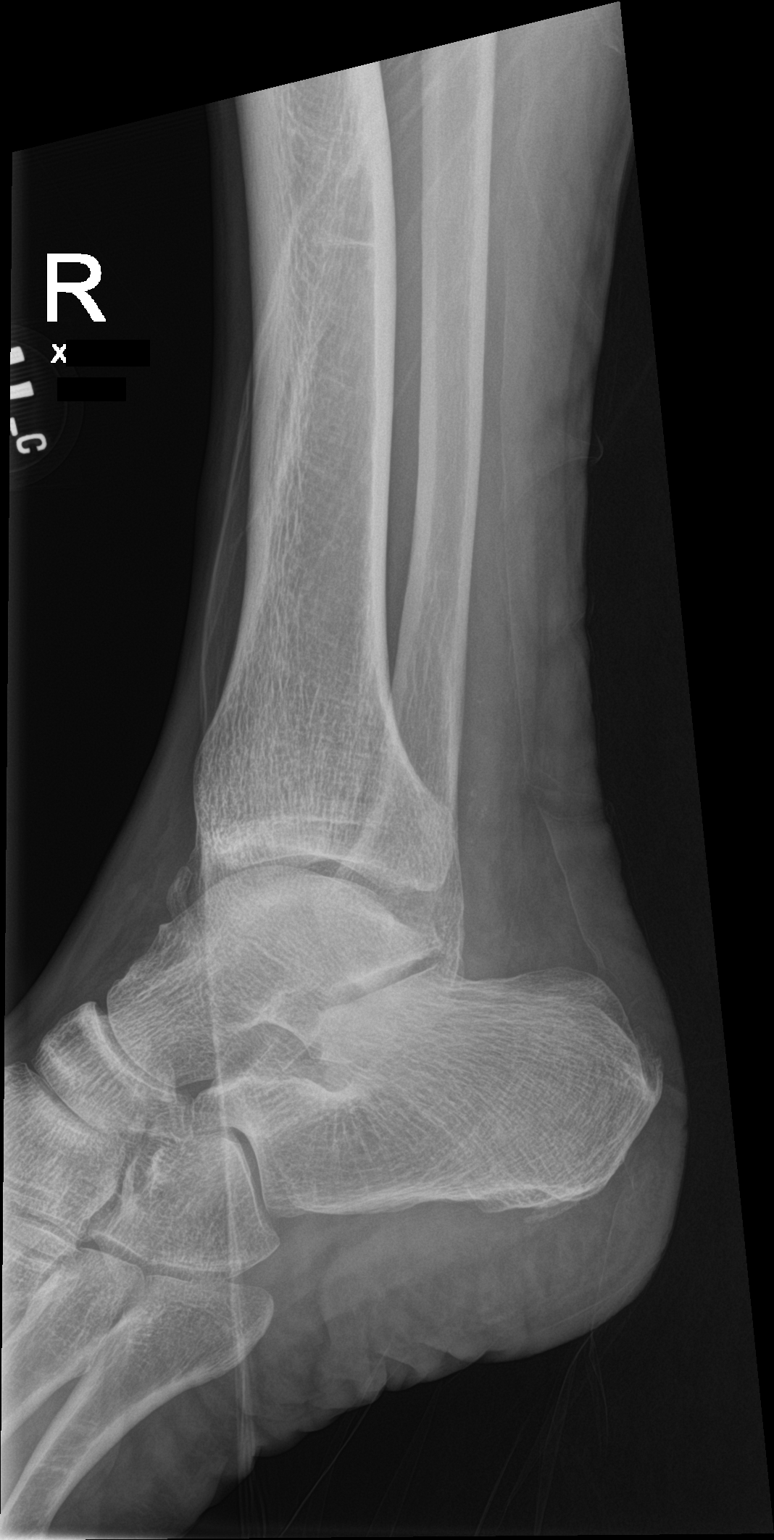

[2 of 2 positions shown; findings below may reference images not displayed]

FINDINGS: Soft tissue swelling. Punctate bony density noted adjacent to the
distal tip of the lateral malleolus. This may represent a tiny
fracture fragment, age undetermined. Density noted over the anterior
ankle on lateral view may be extrinsic to the patient. Overlapping
structures noted over the right ankle on lateral view. Repeat
lateral view and oblique views of the ankle can be obtained without
overlapping structures to further evaluate if need be.
IMPRESSION: Soft tissue swelling. Punctate bony density noted adjacent to the
distal tip of the lateral malleolus. This may represent a tiny
punctate fracture fragment, age undetermined. Density noted over the
anterior ankle on lateral view may be extrinsic to the patient.
Repeat lateral view and oblique views of the ankle without
overlapping structures (sheets/dressings) can be obtained to further
evaluate if need be.

## 2020-04-16 NOTE — Progress Notes (Signed)
STROKE TEAM PROGRESS NOTE   INTERVAL HISTORY His family members is at the bedside and translates for him.  Sleepy but easily awakens and interactive this morning. Dysarthric, right gaze preference, left field cut, left sided neglect,.  Left dense hemiplegia.  Good strength on the right.    He still complains of right ankle pain.  There is trace swelling of the right ankle but no red area..  Afebrile.  Blood pressure adequately controlled.  Vitals:   04/15/20 2309 04/16/20 0358 04/16/20 0802 04/16/20 1100  BP: 129/87 (!) 159/74 136/80 126/79  Pulse: 88 (!) 106 88 88  Resp: 17 19 17 20   Temp: 99.6 F (37.6 C) 99.3 F (37.4 C) 98.7 F (37.1 C) 99.5 F (37.5 C)  TempSrc: Oral Axillary Oral Axillary  SpO2: 100% 99% 98% 100%  Weight:      Height:       CBC:  Recent Labs  Lab 04/12/20 0107 04/15/20 0227  WBC 10.6* 12.1*  HGB 13.4 13.1  HCT 40.3 38.5*  MCV 88.2 86.7  PLT 238 280   Basic Metabolic Panel:  Recent Labs  Lab 04/13/20 0550 04/15/20 0227  NA 145 138  K 3.5 3.8  CL 108 103  CO2 25 25  GLUCOSE 127* 101*  BUN 23 25*  CREATININE 0.74 0.72  CALCIUM 9.0 8.9    IMAGING past 24 hours No results found.    PHYSICAL EXAM   Temp:  [96.5 F (35.8 C)-99.6 F (37.6 C)] 99.5 F (37.5 C) (03/02 1100) Pulse Rate:  [85-106] 88 (03/02 1100) Resp:  [17-20] 20 (03/02 1100) BP: (110-159)/(60-87) 126/79 (03/02 1100) SpO2:  [98 %-100 %] 100 % (03/02 1100)  General - Well nourished, well developed elderly Hispanic male, in no apparent distress.  Right ankle slight swelling and mild tenderness.  No redness no deformity Ophthalmologic - fundi not visualized due to noncooperation.  Neuro - easily arousable.  Answers orientation questions appropriately with mild dysarthria.  Fluent speech.  Left visual neglect vs. Hemianopia. Right gaze, not able to cross midline. Right pupil 11mm and left 4mm, sluggish to light. Left facial droop. Left UE and LE mild withdraw to pain. RUE and  RLE follows commands 4/5 strength throughout.  Sensation and coordination diminished on the left and gait not tested.  ASSESSMENT/PLAN Gregory Osborn a 75 y.o.malewith a history of recent urologic issues including UTI (01/22), urinary retention with foley cath placed, hematuria (likely traumatic due to self cath) s/p cystoscopy 04/01/20 by Dr. 04/03/20 [facility])  showing BPH with recommended surgical treatment. Surgical history includes appendectomy.He had been doing well up until 11:25am when he was noted to develop left hemiplegia. EMS was contacted, and took him to the [facility] ED where stroke workup was undertaken. He was noted to have left hemiplegia and neglect. NIHSS score 15.  He was not given tPA due to the hematuria. Transferred urgently to [facility] for thrombectomy which was aborted after multiple attempts which were unsuccessful.   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    MRI showed large right MCA infarct involving entire right MCA, consistent with malignant right MCA syndrome.  Small right SAH likely due to hemorrhagic conversion.    MRA head showed right M1 occluded.   CT repeat 2/23 right MCA large infarct with  MLS 85mm  2D Echo EF 60-65%, No shunt   May consider 30 day cardiac event monitoring as outpt to rule out afib  LDL 118  HgbA1c 5.6  VTE prophylaxis - heparin subq  No antithrombotics PTA, now on ASA 325mg   Therapy recommendations:  SNF  Disposition:  pending   Cerebral Edema  Critical care medicine team consulted.  Repeat HCT 2/19 shows worsening shift and edema, MLS at 19mm  CT repeat 2/23 right MCA large infarct with MLS 61mm  On 3% saline -> NS -> off  Na 160->157->160->153->154->151-> 147->145  Neurosurgery, Dr. 13m,  consulted and advised patient is not a candidate for surgical intervention.   Headache  Depakote d/c today as this may be contributing to somnolence/encephalopathy.    If headache persists will try Topamax  Hypertensive urgency  Home meds:  Lisinopril 5mg  daily   BP stable now  On amlodipine 10  Off lisinopril due to AKI . BP goal < 160 given hemorrhagic conversion . Long-term BP goal normotensive  HLD   No statin prior to admission  LDL 118, goal < 70  On lipitor 40mg  daily  Continue statin on discharge  Dysphagia   NPO after neurological decline  Swallow evaluation today.  Recommend NPO except meds.    May need PEG for SNF placement, continue cortrak for now  On TF and FW, Nutrition team following   AKI  Urinary retention d/t BPH present on admission  Creatinine 0.93->2.08->0.74->.72  Developed urinary retention and UTI about a month ago necessitating trial of intermittent self cath (failed) and then subsequent foley placement  S/p cystoscopy 2/15 with recommended surgical intervention for BPH by Dr. 09-14-1990 [facility])  Hematuria appears transient after attempts at self cath per urology notes review. Hematuria not present on UA at admission.  Continue foley catheter, Monitor for hematuria. No signs of active bleeding. hgb stable. Foley output remains clear yellow.   UA WBC 11-20, but urine culture neg  Blood culture - 1/2 contamination  CXR neg  Fever  Tmax 99.1 -> afebrile  WBC 12.1, repeat tomorrow  Pain  Bilateral feet pain  No calf tenderness  Tylenol prn  Other Stroke Risk Factors  ETOH use, alcohol level <10, family reports he drinks regularly but not CBC and BMP in a.m.  Long discussion with the patient's son and wife at the bedside and answered questions.  Greater than 50% time during the 35-minute visit was spent on counseling and coordination of care about the stroke and dysphagia answering questions problem.   Other Active  Problems  Hypokalemia - 3.4 ->3.5->3.8  Code status - DNR  Right ankle pain-unclear etiology we will check x-rays  Hospital day # 14  Patient is neurologically stable.  Speech therapy recommended by mouth except medicines.  Continue tube feeds.  We will need to make a decision about PEG tube prior to transfer to nursing home unless his swallowing function improves clinically over the next few days.  Check x-rays of the right ankle given persistent pain despite giving Tylenol  .  Discussed with patient, and family member at the bedside and answered questions.  Greater than 50% time during this 25-minute visit was spent counseling and coordination of care and discussion of care  3/15, MD Medical Director [facility]  Pager: [phone] 04/16/2020 1:52 PM     To contact Stroke Continuity provider, please refer to [facility]. After hours, contact General Neurology

## 2020-04-16 NOTE — Progress Notes (Signed)
This nurse contacted the provider to see if we could DC PICC line. Despite pt having no IV medications that the PICC could only be removed if there was an active PIV in place. This nurse could not find an appropriate site for PIV. Mayford Knife RN

## 2020-04-17 DIAGNOSIS — I6300 Cerebral infarction due to thrombosis of unspecified precerebral artery: Secondary | ICD-10-CM | POA: Diagnosis not present

## 2020-04-17 MED ORDER — RESOURCE THICKENUP CLEAR PO POWD
ORAL | Status: DC | PRN
  Filled 2020-04-17 (×3): qty 125

## 2020-04-17 MED ORDER — CHLORPROMAZINE HCL 25 MG PO TABS
12.5000 mg | ORAL_TABLET | Freq: Three times a day (TID) | ORAL | Status: DC
  Administered 2020-04-17 – 2020-04-21 (×13): 12.5 mg via ORAL
  Filled 2020-04-17 (×16): qty 1

## 2020-04-17 NOTE — TOC Initial Note (Addendum)
Transition of Care [facility]) - Initial/Assessment Note    Patient Details  Name: Gregory Osborn MRN: [id number] Date of Birth: 12-21-1945  [facility] CM/SW Contact:    Eduard Roux, LCSWA Phone Number: 04/17/2020, 3:16 PM  Clinical Narrative:                  CSW visit with patient and family at bedside. CSW spoke with patient's son Norva Pavlov. CSW introduced self and explained role. CSW discussed with family therapy recommendation of short term rehab at [facility]. Patient's family acknowledges the need for therapy and is in agreement with SNF placement. Family prefers SNF near their home. CSW explained the SNF process. CSW explained will begin search once cortrak is removed. Family states understanding. Patient's son gave CSW permission to talk with his spouse(Ivonne # [phone]) if unable to reach him. CSW gave patient's son medicare .gov SNF listing to review for possible placement.   Family is agreeable to SNF placement CSW will send referrals once cortrak has been removed.  CSW will continue to follow and assist with discharge planning.   Antony Blackbird, MSW, LCSW Clinical Social Worker  Expected Discharge Plan: Skilled Nursing Facility Barriers to Discharge: Continued Medical Work up   Patient Goals and CMS Choice        Expected Discharge Plan and Services Expected Discharge Plan: Skilled Nursing Facility   Discharge Planning Services: CM Consult   Living arrangements for the past 2 months: Single Family Home                                      Prior Living Arrangements/Services Living arrangements for the past 2 months: Single Family Home Lives with:: Self,Spouse,Adult Children Patient language and need for interpreter reviewed:: No        Need for Family Participation in Patient Care: Yes (Comment) Care giver support system in place?: Yes (comment)   Criminal Activity/Legal Involvement Pertinent to Current Situation/Hospitalization: No - Comment  as needed  Activities of Daily Living Home Assistive Devices/Equipment: None ADL Screening (condition at time of admission) Patient's cognitive ability adequate to safely complete daily activities?: No Is the patient deaf or have difficulty hearing?: No Does the patient have difficulty seeing, even when wearing glasses/contacts?: No Does the patient have difficulty concentrating, remembering, or making decisions?: Yes Patient able to express need for assistance with ADLs?: No Does the patient have difficulty dressing or bathing?: Yes Independently performs ADLs?: No Communication: Needs assistance Is this a change from baseline?: Change from baseline, expected to last >3 days Dressing (OT): Dependent Is this a change from baseline?: Change from baseline, expected to last >3 days Grooming: Dependent Is this a change from baseline?: Change from baseline, expected to last >3 days Feeding: Dependent Is this a change from baseline?: Change from baseline, expected to last >3 days Bathing: Dependent Is this a change from baseline?: Change from baseline, expected to last >3 days Toileting: Dependent Is this a change from baseline?: Change from baseline, expected to last >3days In/Out Bed: Dependent Is this a change from baseline?: Change from baseline, expected to last >3 days Walks in Home: Dependent Is this a change from baseline?: Change from baseline, expected to last >3 days Does the patient have difficulty walking or climbing stairs?: Yes Weakness of Legs: Left Weakness of Arms/Hands: Left  Permission Sought/Granted  Permission granted to share info w Relationship: Amauris Debois  Permission granted to share info w Contact Information: [phone]  Emotional Assessment Appearance:: Appears stated age Attitude/Demeanor/Rapport: Unable to Assess Affect (typically observed): Unable to Assess Orientation: : Oriented to Self,Oriented to Place,Oriented to  Time Alcohol /  Substance Use: Not Applicable Psych Involvement: No (comment)  Admission diagnosis:  Stroke (cerebrum) [facility]) [I63.9] Patient Active Problem List   Diagnosis Date Noted  . Aspiration into airway   . Stroke (cerebrum) (HCC) 04/02/2020   PCP:  Patient, No Pcp Per Pharmacy:   [id number] Rosalita Levan,  - [address] AT [facility] [address] [place] [address] Phone: [phone] Fax: [phone]     Social Determinants of Health (SDOH) Interventions    Readmission Risk Interventions No flowsheet data found.

## 2020-04-17 NOTE — Progress Notes (Signed)
STROKE TEAM PROGRESS NOTE   INTERVAL HISTORY No acute events Son and sister at bedside.  More alert today, able to speak in full sentences. Complaining about puree diet restriction and wanting real food per his son. Right ankle pain persists. Xray yesterday showed punctate bony density noted adjacent to the distal tip of the lateral malleolus. Neurologically stable awaiting SNF placement once feeding status has been sorted.   Vitals:   04/16/20 1935 04/16/20 2336 04/17/20 0310 04/17/20 0733  BP: 126/81 131/83 (!) 139/94 (!) 151/86  Pulse: 83 96  99  Resp: 14 17 19 17   Temp: 99.1 F (37.3 C) 99 F (37.2 C) 99.2 F (37.3 C) 100.1 F (37.8 C)  TempSrc: Axillary Axillary Axillary Axillary  SpO2: 100% 96%  96%  Weight:      Height:       CBC:  Recent Labs  Lab 04/12/20 0107 04/15/20 0227  WBC 10.6* 12.1*  HGB 13.4 13.1  HCT 40.3 38.5*  MCV 88.2 86.7  PLT 238 280   Basic Metabolic Panel:  Recent Labs  Lab 04/13/20 0550 04/15/20 0227  NA 145 138  K 3.5 3.8  CL 108 103  CO2 25 25  GLUCOSE 127* 101*  BUN 23 25*  CREATININE 0.74 0.72  CALCIUM 9.0 8.9    IMAGING past 24 hours DG Ankle 2 Views Right  Result Date: 04/16/2020 CLINICAL DATA:  Ankle pain. EXAM: RIGHT ANKLE - 2 VIEW COMPARISON:  No prior. FINDINGS: Soft tissue swelling. Punctate bony density noted adjacent to the distal tip of the lateral malleolus. This may represent a tiny fracture fragment, age undetermined. Density noted over the anterior ankle on lateral view may be extrinsic to the patient. Overlapping structures noted over the right ankle on lateral view. Repeat lateral view and oblique views of the ankle can be obtained without overlapping structures to further evaluate if need be. IMPRESSION: Soft tissue swelling. Punctate bony density noted adjacent to the distal tip of the lateral malleolus. This may represent a tiny punctate fracture fragment, age undetermined. Density noted over the anterior ankle on  lateral view may be extrinsic to the patient. Repeat lateral view and oblique views of the ankle without overlapping structures (sheets/dressings) can be obtained to further evaluate if need be. Electronically Signed   By: 06/16/2020  Register   On: 04/16/2020 13:51    PHYSICAL EXAM   Temp:  [98.7 F (37.1 C)-100.1 F (37.8 C)] 100.1 F (37.8 C) (03/03 0733) Pulse Rate:  [82-99] 99 (03/03 0733) Resp:  [14-20] 17 (03/03 0733) BP: (126-151)/(69-94) 151/86 (03/03 0733) SpO2:  [96 %-100 %] 96 % (03/03 0733)  General - Well nourished, well developed elderly Hispanic male, in no apparent distress.  Right ankle slight swelling and mild tenderness.  No redness no deformity Ophthalmologic - fundi not visualized due to noncooperation.  Neuro - easily arousable.  Answers orientation questions appropriately with mild dysarthria.  Fluent speech.  Left visual neglect vs. Hemianopia. Right gaze, not able to cross midline. Right pupil 33mm and left 69mm, sluggish to light. Left facial droop. Left UE and LE mild withdraw to pain. RUE and RLE follows commands 4/5 strength throughout.  Sensation and coordination diminished on the left and gait not tested.  ASSESSMENT/PLAN Gregory Osborn a 75 y.o.malewith a history of recent urologic issues including UTI (01/22), urinary retention with foley cath placed, hematuria (likely traumatic due to self cath) s/p cystoscopy 04/01/20 by Dr. 04/03/20 [facility])  showing BPH with recommended surgical treatment. Surgical  history includes appendectomy.He had been doing well up until 11:25am when he was noted to develop left hemiplegia. EMS was contacted, and took him to the [facility] ED where stroke workup was undertaken. He was noted to have left hemiplegia and neglect. NIHSS score 15.  He was not given tPA due to the hematuria. Transferred urgently to [facility] for thrombectomy which was aborted after multiple attempts which were unsuccessful.   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    MRI showed large right MCA infarct involving entire       right MCA, consistent with malignant right MCA    syndrome.  Small right SAH likely due to hemorrhagic conversion.    MRA head showed right M1 occluded.   CT repeat 2/23 right MCA large infarct with MLS 16mm  2D Echo EF 60-65%, No shunt   May consider 30 day cardiac event monitoring       as outpt to rule out afib  LDL 118  HgbA1c 5.6  VTE prophylaxis - heparin subq  No antithrombotics PTA, now on ASA 325mg   Therapy recommendations:  SNF  Disposition:  pending   Cerebral Edema  Repeat HCT 2/19 shows worsening shift and       edema, MLS at 17mm  CT repeat 2/23 right MCA large infarct with MLS      75mm  On 3% saline -> NS -> off  Na 160->157->160->153->154->151->             147->145  Neurosurgery, Dr. 13m, consulted and advised patient is not a candidate for surgical intervention.   Headache  Depakote d/c 3/2 as this may be contributing to somnolence/encephalopathy.    If headache persists will try Topamax  Hypertensive urgency  Home meds:  Lisinopril 5mg  daily   BP stable now  On amlodipine 10  Off lisinopril due to AKI . BP goal < 160 given hemorrhagic conversion . Long-term BP goal normotensive  HLD   No statin prior to admission  LDL 118, goal < 70  On lipitor 40mg  daily  Continue statin on discharge  Dysphagia   NPO after neurological decline  Swallow evaluation 3/2.  Failed and recommended NPO.   May need PEG for SNF placement, continue cortrak for now.   On TF and FW, Nutrition team following   AKI  Urinary retention d/t BPH present on admission  Creatinine 0.93->2.08->0.74->.72  Developed urinary retention and UTI about a month ago  necessitating trial of intermittent self cath (failed) and then subsequent foley placement  S/p cystoscopy 2/15 with recommended surgical intervention for BPH by Dr. [facility])  Hematuria appears transient after attempts at self cath per urology notes review. Hematuria not present on UA at admission.  Continue foley catheter, Monitor for hematuria. No signs of active bleeding. hgb stable. Foley output remains clear yellow.   UA WBC 11-20, but urine culture neg  Blood culture - 1/2 contamination  CXR neg  Tiny R ankle fracture Punctate bony density noted adjacent to the distal tip of the lateral malleolus. Ortho PA on call recommends Cam boot, WBAT and follow up with 3/15, PA in 2 weeks which was requested. No formal consult required per ortho PA.   Pain  Bilateral feet pain  No calf tenderness  Tylenol prn  Other Stroke Risk Factors  ETOH use, alcohol level <10, family reports he drinks regularly but not CBC and BMP in a.m.  Long discussion with the patient's son and wife at the bedside and answered questions.  Greater than 50% time during the 35-minute visit was spent on counseling and coordination of care about the stroke and dysphagia answering questions problem.   Other Active Problems  Hypokalemia - 3.4 ->3.5->3.8  Code status - DNR  Right ankle pain-unclear etiology we will check x-rays  I have personally obtained history,examined this patient, reviewed notes, independently viewed imaging studies, participated in medical decision making and plan of care.ROS completed by me personally and pertinent positives fully documented  I have made any additions or clarifications directly to the above note. Agree with note above.  Patient still complaining of right ankle pain and x-ray shows possible small bony fracture.  Will ask orthopedics to consult fine.  Patient encouraged to increase oral intake so that core track tube can be removed soon.  Await transfer to  rehab in a skilled nursing setting.  Long discussion with the patient's son and wife at the bedside and answered questions.  Greater than 50% time during the 35-minute visit was spent in counseling and coordination of care of his stroke and dysphagia leg pain and answering questions. Delia Heady, MD  Delia Heady, MD Medical Director [facility] Pager: [phone] 04/17/2020 4:04 PM  To contact Stroke Continuity provider, please refer to [email or web address]. After hours, contact General Neurology

## 2020-04-17 NOTE — Progress Notes (Signed)
  Speech Language Pathology Treatment: Dysphagia;Cognitive-Linquistic  Patient Details Name: Gregory Osborn MRN: [id number] DOB: 09/29/1945 Today's Date: 04/17/2020 Time: 2694-8546 SLP Time Calculation (min) (ACUTE ONLY): 25 min  Assessment / Plan / Recommendation Clinical Impression  Pt calm and participatory, easily distracted by blanket or activity in right visual field, but able to follow one step commands with repetition. SLP and sister fed pt carefully, nectar thick liquids with hand over hand assist, occasional verbal cues to slow down. Also pudding with a spoon. Occasional anterior spillage observed, residue in left buccal cavity removed with suction. No signs of aspiration. Intake minimal but good. Discussed benefit of modified texture for safety at this time, then plans to advance when appropriate. Family can drink in pudding/puree texture from home. Thickener will be ordered for bedside. Encouraged pt to eat well so feeding tube can be removed. Will f/u for tolerance.   HPI HPI: Gregory Osborn is a 75 y.o. male with a history of UTI, urinary retention, hematuria, and appendectomy. He was seen by his urologist yesterday, and was doing well. Plans were made to undergo ultrasound, and to treat for UTI. He has been doing well up until 11:25am when he was noted to develop left hemiplegia. EMS was contacted, and took him to the hospital ED where stroke workup was undertaken. He was noted to have left hemiplegia and neglect. He was not given tPA due to the hematuria. Transferred urgently for thrombectomy. 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 2/19 presented with clinical decline, somnolence, not following commands. CT head showed increase midline shift to 12 mm and edema.  2/21 with some improved mentation.      SLP Plan  Continue with current plan of care       Recommendations  Diet recommendations: Dysphagia 1  (puree);Nectar-thick liquid Liquids provided via: Cup;Teaspoon Medication Administration: Crushed with puree Supervision: Staff to assist with self feeding;Full supervision/cueing for compensatory strategies;Trained caregiver to feed patient Compensations: Lingual sweep for clearance of pocketing;Minimize environmental distractions;Monitor for anterior loss                General recommendations: Rehab consult Oral Care Recommendations: Oral care BID Follow up Recommendations: Inpatient Rehab SLP Visit Diagnosis: Dysphagia, oropharyngeal phase (R13.12) Plan: Continue with current plan of care       GO               Harlon Ditty, MA CCC-SLP  Acute Rehabilitation Services Pager [phone] Office [phone]  Claudine Mouton 04/17/2020, 4:45 PM

## 2020-04-17 NOTE — Progress Notes (Addendum)
Nutrition Follow-up  DOCUMENTATION CODES:   Not applicable  INTERVENTION:  Continue tube feeds via Cortrak NGT using Osmolite 1.2 cal formula at goal rate of 65 ml/hr.   Continue 45 ml Prosource TF once daily per tube.   Free water flushes of 200 ml q 6 hours per tube. (MD to adjust as appropriate)  Tube feeding regimen provides 1912 kcal, 98 grams of protein, and 2079 ml free water.   NUTRITION DIAGNOSIS:   Inadequate oral intake related to lethargy/confusion,dysphagia as evidenced by NPO status; ongoing  GOAL:   Patient will meet greater than or equal to 90% of their needs; met with TF  MONITOR:   TF tolerance,Skin,Weight trends,Labs,I & O's  REASON FOR ASSESSMENT:   Consult Enteral/tube feeding initiation and management  ASSESSMENT:   75 year old male who presented on 2/16 with stroke with left hemiplegia. PMH of UTI, urinary retention, hematuria, and appendectomy.   2/16 - s/p diagnostic cerebral angiogram and unsuccessful mechanical thrombectomy attempt in IR 2/19 NPO due to mental decline 2/21 Cortrak NGT placed for TF 2/25 Cortrak NGT replaced after pt had pulled out tube  Pt underwent MBS yesterday. Pt with moderate oropharyngeal dysphagia impacted by cognitive deficits. SLP recommends NPO status. Pt continues to have Cortrak NGT in placed with tube feeds infusing at goal. Per MD, plans to continue tube feeds with plans for possible PEG for SNF placement. Will continue with current tube feeding orders.   Labs and medications reviewed.   Diet Order:   Diet Order            Diet NPO time specified Except for: Ice Chips  Diet effective now                 EDUCATION NEEDS:   No education needs have been identified at this time  Skin:  Skin Assessment: Reviewed RN Assessment Skin Integrity Issues:: Incisions Incisions: right groin  Last BM:  3/1  Height:   Ht Readings from Last 1 Encounters:  04/02/20 5' 5" (1.651 m)    Weight:   Wt  Readings from Last 1 Encounters:  04/02/20 76.3 kg   BMI:  Body mass index is 27.99 kg/m.  Estimated Nutritional Needs:   Kcal:  1800-2000  Protein:  85-100 grams  Fluid:  1.8-2.0 L  Corrin Parker, MS, RD, LDN RD pager number/after hours weekend pager number on Amion.

## 2020-04-18 DIAGNOSIS — J029 Acute pharyngitis, unspecified: Secondary | ICD-10-CM | POA: Diagnosis not present

## 2020-04-18 DIAGNOSIS — R066 Hiccough: Secondary | ICD-10-CM

## 2020-04-18 DIAGNOSIS — Z781 Physical restraint status: Secondary | ICD-10-CM | POA: Diagnosis not present

## 2020-04-18 LAB — BASIC METABOLIC PANEL
Anion gap: 10 (ref 5–15)
Anion gap: 10 (ref 5–15)
BUN: 27 mg/dL — ABNORMAL HIGH (ref 8–23)
BUN: 30 mg/dL — ABNORMAL HIGH (ref 8–23)
CO2: 23 mmol/L (ref 22–32)
CO2: 24 mmol/L (ref 22–32)
Calcium: 9.3 mg/dL (ref 8.9–10.3)
Calcium: 9.6 mg/dL (ref 8.9–10.3)
Chloride: 97 mmol/L — ABNORMAL LOW (ref 98–111)
Chloride: 97 mmol/L — ABNORMAL LOW (ref 98–111)
Creatinine, Ser: 0.78 mg/dL (ref 0.61–1.24)
Creatinine, Ser: 0.85 mg/dL (ref 0.61–1.24)
GFR, Estimated: >60 mL/min (ref >60)
GFR, Estimated: >60 mL/min (ref >60)
Glucose, Bld: 132 mg/dL — ABNORMAL HIGH (ref 70–99)
Glucose, Bld: 127 mg/dL — ABNORMAL HIGH (ref 70–99)
Potassium: 4.7 mmol/L (ref 3.5–5.1)
Potassium: 4.7 mmol/L (ref 3.5–5.1)
Sodium: 130 mmol/L — ABNORMAL LOW (ref 135–145)
Sodium: 131 mmol/L — ABNORMAL LOW (ref 135–145)

## 2020-04-18 MED ORDER — SODIUM CHLORIDE 0.9 % IV SOLN
25.0000 mg | Freq: Four times a day (QID) | INTRAVENOUS | Status: DC | PRN
  Administered 2020-04-19: 03:00:00 25 mg via INTRAVENOUS
  Filled 2020-04-18 (×3): qty 1

## 2020-04-18 MED ORDER — SODIUM CHLORIDE 0.9 % IV SOLN: INTRAVENOUS | Status: DC

## 2020-04-18 MED ORDER — BENZOCAINE 20 % MT AERO
INHALATION_SPRAY | Freq: Four times a day (QID) | OROMUCOSAL | Status: DC | PRN
  Administered 2020-04-18 – 2020-04-20 (×2): 1 via OROMUCOSAL
  Filled 2020-04-18: qty 57

## 2020-04-18 MED ORDER — ENSURE ENLIVE PO LIQD
237.0000 mL | Freq: Three times a day (TID) | ORAL | Status: DC
  Administered 2020-04-19 – 2020-04-25 (×15): 237 mL via ORAL
  Filled 2020-04-18: qty 237

## 2020-04-18 NOTE — Progress Notes (Signed)
Renewed non-violent restraints Chlorpromazine 25mg  IV q6h for intractable hiccups PRN Benzocaine spray for sore throat PRN

## 2020-04-18 NOTE — Progress Notes (Signed)
STROKE TEAM PROGRESS NOTE   INTERVAL HISTORY No acute events Denies pain today. No family at bedside.Remains drowsy. Following some commands on the right.  Ortho provided CAM boot for small R ankle fx with WBAT.    Vitals:   04/17/20 1928 04/17/20 2356 04/18/20 0333 04/18/20 0733  BP: 123/69   (!) 145/87  Pulse: 94   99  Resp: 18   18  Temp: 98.5 F (36.9 C) 98 F (36.7 C) 98.4 F (36.9 C) 99.1 F (37.3 C)  TempSrc: Oral Oral Oral Oral  SpO2: 94%   91%  Weight:      Height:       CBC:  Recent Labs  Lab 04/12/20 0107 04/15/20 0227  WBC 10.6* 12.1*  HGB 13.4 13.1  HCT 40.3 38.5*  MCV 88.2 86.7  PLT 238 280   Basic Metabolic Panel:  Recent Labs  Lab 04/15/20 0227 04/18/20 0209  NA 138 130*  K 3.8 4.7  CL 103 97*  CO2 25 23  GLUCOSE 101* 132*  BUN 25* 27*  CREATININE 0.72 0.78  CALCIUM 8.9 9.3   IMAGING past 24 hours No results found.  PHYSICAL EXAM   Temp:  [98 F (36.7 C)-99.1 F (37.3 C)] 99.1 F (37.3 C) (03/04 0733) Pulse Rate:  [87-99] 99 (03/04 0733) Resp:  [15-19] 18 (03/04 0733) BP: (112-145)/(69-87) 145/87 (03/04 0733) SpO2:  [91 %-97 %] 91 % (03/04 0733)  General - Well nourished, well developed elderly Hispanic male, in no apparent distress.  Right ankle slight swelling and mild tenderness.  No redness no deformity Ophthalmologic - fundi not visualized due to noncooperation.  Neuro - easily arousable.  Answers orientation questions appropriately with mild dysarthria.  Fluent speech.  Left visual neglect vs. Hemianopia. Right gaze, not able to cross midline. Right pupil 20mm and left 23mm, sluggish to light. Left facial droop. Left UE and LE mild withdraw to pain. RUE and RLE follows commands 4/5 strength throughout.  Sensation and coordination diminished on the left and gait not tested.  ASSESSMENT/PLAN Raji Glinski a 75 y.o.malewith a history of recent urologic issues including UTI (01/22), urinary retention with foley cath placed,  hematuria (likely traumatic due to self cath) s/p cystoscopy 04/01/20 by Dr. Pete Glatter [facility])  showing BPH with recommended surgical treatment. Surgical history includes appendectomy.He had been doing well up until 11:25am when he was noted to develop left hemiplegia. EMS was contacted, and took him to the [facility] ED where stroke workup was undertaken. He was noted to have left hemiplegia and neglect. NIHSS score 15.  He was not given tPA due to the hematuria. Transferred urgently to [facility] for thrombectomy which was aborted after multiple attempts which were unsuccessful.   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    MRI showed large right MCA infarct involving entire       right MCA, consistent with malignant right MCA    syndrome.  Small right SAH likely due to hemorrhagic conversion.    MRA head showed right M1 occluded.   CT repeat 2/23 right MCA large infarct with MLS 8mm  2D Echo EF 60-65%, No shunt   May consider 30 day cardiac event monitoring       as outpt to rule out afib  LDL 118  HgbA1c 5.6  VTE prophylaxis - heparin subq  No antithrombotics PTA, now on ASA 325mg   Therapy recommendations:  SNF  Disposition:  pending   Cerebral Edema  Repeat HCT 2/19 shows worsening shift and       edema, MLS at 41mm  CT repeat 2/23 right MCA large infarct with MLS      60mm  On 3% saline -> NS -> off  Na 160->157->160->153->154->151->             147->145  Neurosurgery, Dr. 13m, consulted and advised patient is not a candidate for surgical intervention.   Headache  Depakote d/c 3/2 as this may be contributing to somnolence/encephalopathy.    If headache persists will try Topamax  Hypertensive urgency  Home meds:  Lisinopril 5mg  daily   BP  stable now  On amlodipine 10  Off lisinopril due to AKI . BP goal < 160 given hemorrhagic conversion . Long-term BP goal normotensive  HLD   No statin prior to admission  LDL 118, goal < 70  On lipitor 40mg  daily  Continue statin on discharge  Dysphagia   NPO after neurological decline  Pulled out cortrak 3/2, tube feeds off since then  Swallow evaluation 3/3, passed for dysphagia nectar thick liquid diet  Calorie count/RD consulted and Pending  May need PEG for SNF placement.  On TF and FW, Nutrition team following   Hyponatremia  Na 131  MIVF NS at 75cc/hr and monitor po intake  Strict intake and output   AKI  Urinary retention d/t BPH present on admission  Creatinine 0.93->2.08->0.74->.72->0.78->0.85  Developed urinary retention and UTI about a month ago necessitating trial of intermittent self cath (failed) and then subsequent foley placement  S/p cystoscopy 2/15 with recommended surgical intervention for BPH by Dr. [facility])  Hematuria appears transient after attempts at self cath per urology notes review. Hematuria not present on UA at admission.  Continue foley catheter, Monitor for hematuria. No signs of active bleeding. hgb stable. Foley output remains clear yellow.   Tiny R ankle fracture  Complaining of right ankle pain   Punctate bony density noted adjacent to the distal tip of       the lateral malleolus on xray   Ortho PA on call recommends Cam boot, WBAT and      follow up with 3/15, PA in 2 weeks which was requested. No formal consult required per ortho PA.    Other Stroke Risk Factors  ETOH use, alcohol level <10, family reports he drinks regularly but not CBC and BMP in a.m.  Long discussion with the patient's son and wife at the bedside and answered questions.  Greater than 50% time during the 35-minute visit was spent on counseling and coordination of care about the stroke and dysphagia answering questions problem.    Other Active Problems  Hypokalemia - 3.4 ->3.5->3.8->4.7  Code status - DNR   Continue ongoing physical occupational therapy.  Patient encouraged to increase oral intake so that core track tube can come out.  Hopefully transfer to skilled nursing facility early next week when bed available and patient able to eat adequately.  No family at the bedside.  Greater than 50% time during the visit was spent counseling and care about stroke and dysphagia and discussion with care team.  Pete Glatter, MD  To contact Stroke Continuity provider, please refer to [facility]. After hours, contact General Neurology

## 2020-04-18 NOTE — Progress Notes (Addendum)
Occupational Therapy Treatment Patient Details Name: Gregory Osborn MRN: [id number] DOB: 1945-07-16 Today's Date: 04/18/2020    History of present illness 75 yo male admmitted to [facility] ED on 2/16 with L hemiplegia and facial drop, imaging reveals R M1 occlusion with attempted thrombectomy (unsuccessful). Small R SAH, likely due to hemorrhagic conversion. ETT 2/16-present. PMH includes HTN, urinary retention with frequent UTIs, BPH, hematuria.   OT comments  Pt progressing slowly towards established OT goals. Continue to present with decreased cognition, vision, balance, strength, and safety. Pt highly distracted by visual stimuli in R visual field. Pt washing his face with Min A for safety and prevent pt from pulling at lines. Pt sitting at EOB with Mod-Max A and bilateral knees blocked; pt with intermittent pushing to R and responding well to cues for decreasing pushing. Pt requiring Max A +2 for sit<>stand with bilateral knees blocked; x3. Continue to recommend dc to SNF and will continue to follow acutely as admitted.    Follow Up Recommendations  SNF    Equipment Recommendations  Other (comment) (Defer to next venue)    Recommendations for Other Services PT consult;Speech consult    Precautions / Restrictions Precautions Precautions: Fall Precaution Comments: L hemiplegia, L inattention, pusher       Mobility Bed Mobility Overal bed mobility: Needs Assistance Bed Mobility: Sit to Supine     Supine to sit: Max assist;+2 for physical assistance Sit to supine: Max assist;+2 for physical assistance   General bed mobility comments: Max A +2 for bringing BLES over EOB and then elevate trunk. Max A +2 for laying back into supine    Transfers Overall transfer level: Needs assistance Equipment used: 2 person hand held assist Transfers: Sit to/from Stand Sit to Stand: Max assist;+2 physical assistance         General transfer comment: pt requries BUE support and bilateral  knee block, demonstrates L knee buckling    Balance Overall balance assessment: Needs assistance Sitting-balance support: No upper extremity supported;Feet supported Sitting balance-Leahy Scale: Poor Sitting balance - Comments: mod-maxA, pushes to left with RUE support, otherwise requires modA due to posterior lean   Standing balance support: Bilateral upper extremity supported Standing balance-Leahy Scale: Zero Standing balance comment: maxA x2-totalA, bilateral knee block                           ADL either performed or assessed with clinical judgement   ADL Overall ADL's : Needs assistance/impaired     Grooming: Wash/dry face;Minimal assistance;Bed level Grooming Details (indicate cue type and reason): Pt washing his face with MIn A for preventing him from pulling cortrack and Mod cues for sequencing.             Lower Body Dressing: Total assistance;Bed level Lower Body Dressing Details (indicate cue type and reason): Don socks Toilet Transfer: Maximal assistance;+2 for physical assistance;+2 for safety/equipment (sti<>Stand at EOB)           Functional mobility during ADLs: Maximal assistance;+2 for physical assistance (sit<>stand) General ADL Comments: Pt presenting with poor cognition, balance, strength, functional use of LUE, and vision.     Vision       Perception     Praxis      Cognition Arousal/Alertness: Awake/alert Behavior During Therapy: Impulsive;Restless Overall Cognitive Status: Impaired/Different from baseline Area of Impairment: Attention;Memory;Following commands;Safety/judgement;Awareness;Problem solving                   Current  Attention Level: Focused (very easily distracted) Memory: Decreased recall of precautions;Decreased short-term memory Following Commands: Follows one step commands with increased time (requires multiple cues at times) Safety/Judgement: Decreased awareness of safety;Decreased awareness of  deficits Awareness: Intellectual Problem Solving: Slow processing;Decreased initiation;Difficulty sequencing;Requires verbal cues;Requires tactile cues General Comments: Pt continues to be easily distracted by visual stimuli in R visual field. Pt requiring increased time adn cues for following simple commands. Pt with decreased pushing this session compared to last session; however, requiring Mod cues for decreasing pushing        Exercises     Shoulder Instructions       General Comments VSS on RA    Pertinent Vitals/ Pain       Pain Assessment: Faces Faces Pain Scale: Hurts even more Pain Location: R foot and LE Pain Descriptors / Indicators: Aching Pain Intervention(s): Monitored during session;Limited activity within patient's tolerance;Repositioned  Home Living                                          Prior Functioning/Environment              Frequency  Min 2X/week        Progress Toward Goals  OT Goals(current goals can now be found in the care plan section)  Progress towards OT goals: Progressing toward goals  Acute Rehab OT Goals Patient Stated Goal: none stated OT Goal Formulation: Patient unable to participate in goal setting Time For Goal Achievement: 04/21/20 Potential to Achieve Goals: Fair ADL Goals Pt Will Perform Grooming: sitting;with min assist Pt Will Perform Upper Body Bathing: with min assist;sitting Pt Will Transfer to Toilet: with mod assist;with +2 assist;stand pivot transfer;bedside commode Additional ADL Goal #1: Pt will locate 2/3 ADL items in L visual field with Mod cues Additional ADL Goal #2: Pt will maintain sitting balance with Min A in preparation for ADLs Additional ADL Goal #3: Pt will follow one step commands during ADLs with Min cues  Plan Discharge plan remains appropriate    Co-evaluation    PT/OT/SLP Co-Evaluation/Treatment: Yes Reason for Co-Treatment: For patient/therapist safety;To address  functional/ADL transfers;Complexity of the patient's impairments (multi-system involvement)   OT goals addressed during session: ADL's and self-care      AM-PAC OT "6 Clicks" Daily Activity     Outcome Measure   Help from another person eating meals?: Total Help from another person taking care of personal grooming?: A Little Help from another person toileting, which includes using toliet, bedpan, or urinal?: Total Help from another person bathing (including washing, rinsing, drying)?: Total Help from another person to put on and taking off regular upper body clothing?: A Lot Help from another person to put on and taking off regular lower body clothing?: Total 6 Click Score: 9    End of Session    OT Visit Diagnosis: Unsteadiness on feet (R26.81);Other abnormalities of gait and mobility (R26.89);Muscle weakness (generalized) (M62.81);Pain;Hemiplegia and hemiparesis;Low vision, both eyes (H54.2);Other symptoms and signs involving cognitive function Hemiplegia - Right/Left: Right Hemiplegia - caused by: Cerebral infarction   Activity Tolerance Patient tolerated treatment well   Patient Left in bed;with call bell/phone within reach;with restraints reapplied (with PT)   Nurse Communication Mobility status;Precautions        Time: 0932-3557 OT Time Calculation (min): 26 min  Charges: OT General Charges $OT Visit: 1 Visit OT Treatments $Self Care/Home Management :  8-22 mins  Kaytlyn Din MSOT, OTR/L Acute Rehab Pager: [phone] Office: [phone]   Theodoro Grist Pamella Samons 04/18/2020, 2:40 PM

## 2020-04-18 NOTE — Progress Notes (Signed)
Nutrition Follow-up  DOCUMENTATION CODES:   Not applicable  INTERVENTION:  48 calorie count initiated.   Provide Ensure Enlive po TID (thickened to nectar thick consistency), each supplement provides 350 kcal and 20 grams of protein  Provide Magic cup TID with meals, each supplement provides 290 kcal and 9 grams of protein  NUTRITION DIAGNOSIS:   Inadequate oral intake related to lethargy/confusion,dysphagia as evidenced by NPO status; diet advanced; progressing  GOAL:   Patient will meet greater than or equal to 90% of their needs; progressing  MONITOR:   PO intake,Supplement acceptance,Diet advancement,Skin,TF tolerance,Weight trends,I & O's,Labs  REASON FOR ASSESSMENT:   Consult Enteral/tube feeding initiation and management  ASSESSMENT:   75 year old male who presented on 2/16 with stroke with left hemiplegia. PMH of UTI, urinary retention, hematuria, and appendectomy.  Diet advanced to a dysphagia 1 diet with nectar thick liquids yesterday afternoon. Meal completion 60% this morning. Family at bedside report pt has been mostly asleep today. Awaiting arrive at lunch during time of visit. Family to encourage pt po intake as appropriate when pt alert to eat. Tube feeds were off/paused during time of visit. Cortrak NGT in place. Calorie count ordered via MD. RD to order nutritional supplements to aid in caloric and protein needs. Possible PEG if pt unable to po adequately. RD to follow up with full calorie count results on Monday, 3/6.  Labs and medications reviewed.   Diet Order:   Diet Order            DIET - DYS 1 Room service appropriate? Yes; Fluid consistency: Nectar Thick  Diet effective now                 EDUCATION NEEDS:   No education needs have been identified at this time  Skin:  Skin Assessment: Reviewed RN Assessment Skin Integrity Issues:: Incisions Incisions: right groin  Last BM:  3/2  Height:   Ht Readings from Last 1 Encounters:   04/02/20 5\' 5"  (1.651 m)    Weight:   Wt Readings from Last 1 Encounters:  04/02/20 76.3 kg   BMI:  Body mass index is 27.99 kg/m.  Estimated Nutritional Needs:   Kcal:  1800-2000  Protein:  85-100 grams  Fluid:  1.8-2.0 L  04/04/20, MS, RD, LDN RD pager number/after hours weekend pager number on Amion.

## 2020-04-18 NOTE — Progress Notes (Signed)
Physical Therapy Treatment Patient Details Name: Gregory Osborn MRN: [id number] DOB: September 09, 1945 Today's Date: 04/18/2020    History of Present Illness 75 yo male admmitted to [place] ED on 2/16 with L hemiplegia and facial drop, imaging reveals R M1 occlusion with attempted thrombectomy (unsuccessful). Small R SAH, likely due to hemorrhagic conversion. ETT 2/16-present. PMH includes HTN, urinary retention with frequent UTIs, BPH, hematuria.    PT Comments    Pt tolerates treatment well, continues to remain very restless and impulsive. Pt continues to need significant physical assistance of 2 individuals to maintain balance and mobilize safely at edge of bed to prevent falls. Pt demonstrates L neglect and pushes to L side with RUE, placing him at a high risk for falls. Pt will benefit from continued acute PT POC to improve mobility quality and to reduce caregiver burden. PT continues to recommend SNF placement at this time.   Follow Up Recommendations  SNF     Equipment Recommendations  Wheelchair (measurements PT);Wheelchair cushion (measurements PT);Hospital bed (mechanical lift)    Recommendations for Other Services       Precautions / Restrictions Precautions Precautions: Fall Precaution Comments: L hemiplegia, L inattention, pusher Restrictions Weight Bearing Restrictions: No    Mobility  Bed Mobility Overal bed mobility: Needs Assistance Bed Mobility: Sit to Supine       Sit to supine: Max assist;+2 for physical assistance        Transfers Overall transfer level: Needs assistance Equipment used: 2 person hand held assist Transfers: Sit to/from Stand Sit to Stand: Max assist;+2 physical assistance         General transfer comment: pt requries BUE support and bilateral knee block, demonstrates L knee buckling  Ambulation/Gait                 Stairs             Wheelchair Mobility    Modified Rankin (Stroke Patients Only) Modified Rankin  (Stroke Patients Only) Pre-Morbid Rankin Score: No symptoms Modified Rankin: Severe disability     Balance Overall balance assessment: Needs assistance Sitting-balance support: No upper extremity supported;Feet supported Sitting balance-Leahy Scale: Poor Sitting balance - Comments: mod-maxA, pushes to left with RUE support, otherwise requires modA due to posterior lean   Standing balance support: Bilateral upper extremity supported Standing balance-Leahy Scale: Zero Standing balance comment: maxA x2-totalA, bilateral knee block                            Cognition Arousal/Alertness: Awake/alert Behavior During Therapy: Impulsive;Restless Overall Cognitive Status: Impaired/Different from baseline Area of Impairment: Attention;Memory;Following commands;Safety/judgement;Awareness;Problem solving                   Current Attention Level: Focused (very easily distracted) Memory: Decreased recall of precautions;Decreased short-term memory Following Commands: Follows one step commands with increased time (requires multiple cues at times) Safety/Judgement: Decreased awareness of safety;Decreased awareness of deficits Awareness: Intellectual Problem Solving: Slow processing;Decreased initiation;Difficulty sequencing;Requires verbal cues;Requires tactile cues        Exercises General Exercises - Lower Extremity Ankle Circles/Pumps: AROM;Right;5 reps Heel Slides: AROM;Right;10 reps Straight Leg Raises: AROM;Right;5 reps Other Exercises Other Exercises: PT performs PROM of LLE in knee flexion/extension, hip flexion/extension/abduction/adduction    General Comments General comments (skin integrity, edema, etc.): VSS on RA      Pertinent Vitals/Pain Pain Assessment: Faces Faces Pain Scale: Hurts even more Pain Location: R foot and LE Pain Descriptors / Indicators:  Aching Pain Intervention(s): Monitored during session    Home Living                       Prior Function            PT Goals (current goals can now be found in the care plan section) Acute Rehab PT Goals Patient Stated Goal: none stated Time For Goal Achievement: 05/02/20 Potential to Achieve Goals: Fair Progress towards PT goals: Progressing toward goals    Frequency    Min 3X/week      PT Plan Current plan remains appropriate    Co-evaluation PT/OT/SLP Co-Evaluation/Treatment: Yes Reason for Co-Treatment: Complexity of the patient's impairments (multi-system involvement);Necessary to address cognition/behavior during functional activity;For patient/therapist safety;To address functional/ADL transfers PT goals addressed during session: Mobility/safety with mobility;Balance;Strengthening/ROM        AM-PAC PT "6 Clicks" Mobility   Outcome Measure  Help needed turning from your back to your side while in a flat bed without using bedrails?: A Lot Help needed moving from lying on your back to sitting on the side of a flat bed without using bedrails?: A Lot Help needed moving to and from a bed to a chair (including a wheelchair)?: Total Help needed standing up from a chair using your arms (e.g., wheelchair or bedside chair)?: A Lot Help needed to walk in hospital room?: Total Help needed climbing 3-5 steps with a railing? : Total 6 Click Score: 9    End of Session Equipment Utilized During Treatment: Gait belt Activity Tolerance: Patient tolerated treatment well Patient left: in bed;with call bell/phone within reach;with bed alarm set;with restraints reapplied Nurse Communication: Mobility status;Need for lift equipment PT Visit Diagnosis: Other abnormalities of gait and mobility (R26.89);Hemiplegia and hemiparesis Hemiplegia - Right/Left: Left Hemiplegia - dominant/non-dominant: Dominant Hemiplegia - caused by: Cerebral infarction     Time: 1001-1024 PT Time Calculation (min) (ACUTE ONLY): 23 min  Charges:  $Therapeutic Activity: 8-22 mins                      Arlyss Gandy, PT, DPT Acute Rehabilitation Pager: [phone]    Arlyss Gandy 04/18/2020, 12:31 PM

## 2020-04-18 NOTE — Progress Notes (Signed)
  Speech Language Pathology Treatment: Dysphagia  Patient Details Name: Gregory Osborn MRN: [id number] DOB: [birth year] Today's Date: 04/18/2020 Time: 6761-9509 SLP Time Calculation (min) (ACUTE ONLY): 18 min  Assessment / Plan / Recommendation Clinical Impression  Pt seen with am meal, initially refused with RN offering food on the left where he is very neglectful. Spanish speaking SLP offered food on his right side with rationale and pt fully agreeable. He is still highly distracted by his right hand, often impedes his own success by pulling at gown, pushing from the right etc. With removal of distractions, gentle verbal cueing pt able to participate in hand over hand assisted feeding though additional cues still needed to take small sips. There was severe oral residue on the left, pt rolling around purees and needing pressure to left side of mouth to eventually swallow these. Session lasted about 20 minutes and pt consumed 50% of meal. Added percentages to meal ticket.  Targeted pts awareness of deficits pointing out needs and rationale for restraint, assist with feeding, why he is not ready to go home yet, what his goals for dysphagia therapy are and how to achieve them. Pt would benefit from CIR at this time, he needs extensive intervention and would always benefit from use of in person interpreter given his cognitive deficits.   HPI HPI: Gregory Osborn is a 75 y.o. male with a history of UTI, urinary retention, hematuria, and appendectomy. He was seen by his urologist yesterday, and was doing well. Plans were made to undergo ultrasound, and to treat for UTI. He has been doing well up until 11:25am when he was noted to develop left hemiplegia. EMS was contacted, and took him to the hospital ED where stroke workup was undertaken. He was noted to have left hemiplegia and neglect. He was not given tPA due to the hematuria. Transferred urgently for thrombectomy. 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 2/19 presented with clinical decline, somnolence, not following commands. CT head showed increase midline shift to 12 mm and edema.  2/21 with some improved mentation.      SLP Plan  Continue with current plan of care       Recommendations  Diet recommendations: Dysphagia 1 (puree);Nectar-thick liquid Liquids provided via: Cup;Teaspoon Medication Administration: Crushed with puree Supervision: Staff to assist with self feeding;Full supervision/cueing for compensatory strategies;Trained caregiver to feed patient Compensations: Lingual sweep for clearance of pocketing;Minimize environmental distractions;Monitor for anterior loss Postural Changes and/or Swallow Maneuvers: Seated upright 90 degrees                General recommendations: Rehab consult Oral Care Recommendations: Oral care BID Follow up Recommendations: Inpatient Rehab SLP Visit Diagnosis: Dysphagia, oropharyngeal phase (R13.12) Plan: Continue with current plan of care       GO               Harlon Ditty, MA CCC-SLP  Acute Rehabilitation Services Pager [phone] Office [phone]  Claudine Mouton 04/18/2020, 11:06 AM

## 2020-04-18 NOTE — Progress Notes (Signed)
Called [facility] for report. Will be transferring and transporting to [facility] Room 37. Family notified.

## 2020-04-19 DIAGNOSIS — I63511 Cerebral infarction due to unspecified occlusion or stenosis of right middle cerebral artery: Secondary | ICD-10-CM | POA: Diagnosis not present

## 2020-04-19 DIAGNOSIS — E871 Hypo-osmolality and hyponatremia: Secondary | ICD-10-CM

## 2020-04-19 DIAGNOSIS — R066 Hiccough: Secondary | ICD-10-CM | POA: Diagnosis not present

## 2020-04-19 DIAGNOSIS — G936 Cerebral edema: Secondary | ICD-10-CM | POA: Diagnosis not present

## 2020-04-19 LAB — BASIC METABOLIC PANEL
Anion gap: 10 (ref 5–15)
BUN: 23 mg/dL (ref 8–23)
CO2: 23 mmol/L (ref 22–32)
Calcium: 9.4 mg/dL (ref 8.9–10.3)
Chloride: 97 mmol/L — ABNORMAL LOW (ref 98–111)
Creatinine, Ser: 0.76 mg/dL (ref 0.61–1.24)
GFR, Estimated: >60 mL/min (ref >60)
Glucose, Bld: 127 mg/dL — ABNORMAL HIGH (ref 70–99)
Potassium: 4.4 mmol/L (ref 3.5–5.1)
Sodium: 130 mmol/L — ABNORMAL LOW (ref 135–145)

## 2020-04-19 LAB — GLUCOSE, CAPILLARY
Glucose-Capillary: 107 mg/dL — ABNORMAL HIGH (ref 70–99)
Glucose-Capillary: 130 mg/dL — ABNORMAL HIGH (ref 70–99)
Glucose-Capillary: 151 mg/dL — ABNORMAL HIGH (ref 70–99)
Glucose-Capillary: 143 mg/dL — ABNORMAL HIGH (ref 70–99)
Glucose-Capillary: 112 mg/dL — ABNORMAL HIGH (ref 70–99)
Glucose-Capillary: 118 mg/dL — ABNORMAL HIGH (ref 70–99)

## 2020-04-19 LAB — CBC
HCT: 40.8 % (ref 39.0–52.0)
Hemoglobin: 13.6 g/dL (ref 13.0–17.0)
MCH: 28.7 pg (ref 26.0–34.0)
MCHC: 33.3 g/dL (ref 30.0–36.0)
MCV: 86.1 fL (ref 80.0–100.0)
Platelets: 422 10*3/uL — ABNORMAL HIGH (ref 150–400)
RBC: 4.74 MIL/uL (ref 4.22–5.81)
RDW: 11.9 % (ref 11.5–15.5)
WBC: 12.6 10*3/uL — ABNORMAL HIGH (ref 4.0–10.5)
nRBC: 0 % (ref 0.0–0.2)

## 2020-04-19 MED ORDER — OSMOLITE 1.2 CAL PO LIQD
840.0000 mL | ORAL | Status: DC
  Administered 2020-04-19: 840 mL

## 2020-04-19 NOTE — NC FL2 (Signed)
[place] MEDICAID FL2 LEVEL OF CARE SCREENING TOOL     IDENTIFICATION  Patient Name: Gregory Osborn Birthdate: 28-Oct-1945 Sex: male Admission Date (Current Location): 04/02/2020  [facility] and [place] Number:  Producer, television/film/video and Address:  [facility], [address], [place], [place] [address]      Provider Number: [id number]  Attending Physician Name and Address:  Marvel Plan, MD  Relative Name and Phone Number:  Truett Mcfarlan, [phone]    Current Level of Care: Hospital Recommended Level of Care: Skilled Nursing Facility Prior Approval Number:    Date Approved/Denied:   PASRR Number: [id number]  Discharge Plan: SNF    Current Diagnoses: Patient Active Problem List   Diagnosis Date Noted  . Aspiration into airway   . Stroke (cerebrum) (HCC) 04/02/2020    Orientation RESPIRATION BLADDER Height & Weight     Self,Time,Place    Incontinent Weight: 168 lb 3.4 oz (76.3 kg) Height:  5\' 5"  (165.1 cm)  BEHAVIORAL SYMPTOMS/MOOD NEUROLOGICAL BOWEL NUTRITION STATUS      Incontinent Diet (See DC summary)  AMBULATORY STATUS COMMUNICATION OF NEEDS Skin   Extensive Assist Verbally Surgical wounds (R groin Incision)                       Personal Care Assistance Level of Assistance  Bathing,Feeding,Dressing Bathing Assistance: Maximum assistance Feeding assistance: Maximum assistance Dressing Assistance: Maximum assistance     Functional Limitations Info  Sight,Hearing,Speech Sight Info: Adequate Hearing Info: Adequate Speech Info: Adequate    SPECIAL CARE FACTORS FREQUENCY  PT (By licensed PT),OT (By licensed OT),Speech therapy     PT Frequency: 5x week OT Frequency: 5x week     Speech Therapy Frequency: 3x week      Contractures Contractures Info: Not present    Additional Factors Info  Code Status,Allergies Code Status Info: DNR Allergies Info: NKA           Current Medications (04/19/2020):  This is the current  hospital active medication list Current Facility-Administered Medications  Medication Dose Route Frequency Provider Last Rate Last Admin  . 0.9 %  sodium chloride infusion   Intravenous Continuous 06/19/2020, MD 20 mL/hr at 04/19/20 0028 Rate Change at 04/19/20 0028  . acetaminophen (TYLENOL) tablet 650 mg  650 mg Oral Q4H PRN Bhagat, Srishti L, MD   650 mg at 04/12/20 2132   Or  . acetaminophen (TYLENOL) 160 MG/5ML solution 650 mg  650 mg Per Tube Q4H PRN Bhagat, Srishti L, MD   650 mg at 04/17/20 1647   Or  . acetaminophen (TYLENOL) suppository 650 mg  650 mg Rectal Q4H PRN Bhagat, Srishti L, MD   650 mg at 04/07/20 1159  . amLODipine (NORVASC) tablet 10 mg  10 mg Per Tube Daily Bhagat, Srishti L, MD   10 mg at 04/19/20 0915  . aspirin tablet 325 mg  325 mg Per Tube Daily Bhagat, Srishti L, MD   325 mg at 04/19/20 0914  . atorvastatin (LIPITOR) tablet 40 mg  40 mg Per Tube Daily Bhagat, Srishti L, MD   40 mg at 04/19/20 0914  . Benzocaine (HURRCAINE) 20 % mouth spray   Mouth/Throat QID PRN 06/19/20, MD   1 application at 04/18/20 2327  . Chlorhexidine Gluconate Cloth 2 % PADS 6 each  6 each Topical Daily Bhagat, Srishti L, MD   6 each at 04/19/20 0916  . chlorproMAZINE (THORAZINE) 25 mg in sodium chloride  0.9 % 25 mL IVPB  25 mg Intravenous Q6H PRN Rica Mote, MD 50 mL/hr at 04/19/20 0300 25 mg at 04/19/20 0300  . chlorproMAZINE (THORAZINE) tablet 12.5 mg  12.5 mg Oral TID Micki Riley, MD   12.5 mg at 04/19/20 1505  . feeding supplement (ENSURE ENLIVE / ENSURE PLUS) liquid 237 mL  237 mL Oral TID BM Micki Riley, MD   237 mL at 04/19/20 0917  . feeding supplement (OSMOLITE 1.2 CAL) liquid 1,000 mL  1,000 mL Per Tube Continuous Bhagat, Srishti L, MD 65 mL/hr at 04/13/20 1832 1,000 mL at 04/13/20 1832  . feeding supplement (PROSource TF) liquid 45 mL  45 mL Per Tube Daily Bhagat, Srishti L, MD   45 mL at 04/19/20 0915  . heparin injection 5,000 Units  5,000 Units  Subcutaneous Q8H Bhagat, Srishti L, MD   5,000 Units at 04/19/20 1505  . MEDLINE mouth rinse  15 mL Mouth Rinse BID Bhagat, Srishti L, MD   15 mL at 04/19/20 0916  . polyethylene glycol (MIRALAX / GLYCOLAX) packet 17 g  17 g Per Tube Daily Bhagat, Srishti L, MD   17 g at 04/19/20 0915  . Resource ThickenUp Clear   Oral PRN Micki Riley, MD      . senna-docusate (Senokot-S) tablet 2 tablet  2 tablet Per Tube BID Gordy Councilman, MD   2 tablet at 04/19/20 0914  . sodium chloride flush (NS) 0.9 % injection 3 mL  3 mL Intravenous Q12H Bhagat, Srishti L, MD   3 mL at 04/19/20 5277     Discharge Medications: Please see discharge summary for a list of discharge medications.  Relevant Imaging Results:  Relevant Lab Results:   Additional Information SS# [id number], [address]

## 2020-04-19 NOTE — TOC Progression Note (Signed)
Transition of Care [facility]) - Progression Note    Patient Details  Name: Gregory Osborn MRN: [id number] Date of Birth: 07-14-1945  [facility]) CM/SW Contact  Carley Hammed, [place] Phone Number: 04/19/2020, 5:06 PM  Clinical Narrative:    CSW was notified by MD that pt's Cortrak had been removed and the faxout process for SNF could be initiated. CSW completed FL2 and faxout. Will follow for bed offers and DC planning.   Expected Discharge Plan: Skilled Nursing Facility Barriers to Discharge: Continued Medical Work up  Expected Discharge Plan and Services Expected Discharge Plan: Skilled Nursing Facility   Discharge Planning Services: CM Consult   Living arrangements for the past 2 months: Single Family Home                                       Social Determinants of Health (SDOH) Interventions    Readmission Risk Interventions No flowsheet data found.

## 2020-04-20 DIAGNOSIS — R066 Hiccough: Secondary | ICD-10-CM | POA: Diagnosis not present

## 2020-04-20 DIAGNOSIS — I63511 Cerebral infarction due to unspecified occlusion or stenosis of right middle cerebral artery: Secondary | ICD-10-CM | POA: Diagnosis not present

## 2020-04-20 DIAGNOSIS — E876 Hypokalemia: Secondary | ICD-10-CM | POA: Diagnosis not present

## 2020-04-20 DIAGNOSIS — E871 Hypo-osmolality and hyponatremia: Secondary | ICD-10-CM | POA: Diagnosis not present

## 2020-04-20 LAB — BASIC METABOLIC PANEL
Anion gap: 11 (ref 5–15)
BUN: 24 mg/dL — ABNORMAL HIGH (ref 8–23)
CO2: 24 mmol/L (ref 22–32)
Calcium: 9.4 mg/dL (ref 8.9–10.3)
Chloride: 97 mmol/L — ABNORMAL LOW (ref 98–111)
Creatinine, Ser: 0.77 mg/dL (ref 0.61–1.24)
GFR, Estimated: >60 mL/min (ref >60)
Glucose, Bld: 106 mg/dL — ABNORMAL HIGH (ref 70–99)
Potassium: 4.4 mmol/L (ref 3.5–5.1)
Sodium: 132 mmol/L — ABNORMAL LOW (ref 135–145)

## 2020-04-20 LAB — GLUCOSE, CAPILLARY
Glucose-Capillary: 100 mg/dL — ABNORMAL HIGH (ref 70–99)
Glucose-Capillary: 117 mg/dL — ABNORMAL HIGH (ref 70–99)
Glucose-Capillary: 134 mg/dL — ABNORMAL HIGH (ref 70–99)
Glucose-Capillary: 140 mg/dL — ABNORMAL HIGH (ref 70–99)
Glucose-Capillary: 145 mg/dL — ABNORMAL HIGH (ref 70–99)
Glucose-Capillary: 145 mg/dL — ABNORMAL HIGH (ref 70–99)
Glucose-Capillary: 167 mg/dL — ABNORMAL HIGH (ref 70–99)

## 2020-04-20 LAB — CBC
HCT: 39.7 % (ref 39.0–52.0)
Hemoglobin: 13.8 g/dL (ref 13.0–17.0)
MCH: 29.4 pg (ref 26.0–34.0)
MCHC: 34.8 g/dL (ref 30.0–36.0)
MCV: 84.6 fL (ref 80.0–100.0)
Platelets: 458 10*3/uL — ABNORMAL HIGH (ref 150–400)
RBC: 4.69 MIL/uL (ref 4.22–5.81)
RDW: 12.2 % (ref 11.5–15.5)
WBC: 9.9 10*3/uL (ref 4.0–10.5)
nRBC: 0 % (ref 0.0–0.2)

## 2020-04-20 MED ORDER — OSMOLITE 1.2 CAL PO LIQD
840.0000 mL | ORAL | Status: DC
  Administered 2020-04-20 – 2020-04-23 (×3): 840 mL
  Filled 2020-04-20 (×2): qty 948

## 2020-04-20 MED ORDER — BACLOFEN 10 MG PO TABS
5.0000 mg | ORAL_TABLET | Freq: Three times a day (TID) | ORAL | Status: DC
  Administered 2020-04-20 – 2020-04-21 (×4): 5 mg via ORAL
  Filled 2020-04-20 (×4): qty 1

## 2020-04-20 NOTE — Progress Notes (Signed)
Patient was interfering with medical equipment and needed soft restraint to right wrist.

## 2020-04-20 NOTE — Progress Notes (Signed)
STROKE TEAM PROGRESS NOTE   INTERVAL HISTORY No family is at bedside, pt sleepy but easily arousable, no neuro changes. Still has right gaze and left hemiplegia. On nocturnal TF, encourage po intake, will do calorie count to see if cortrak can be d/c, otherwise, he needs PEG for SNF placement.  Vitals:   04/20/20 0800 04/20/20 1153 04/20/20 1652 04/20/20 2002  BP: 125/75 127/75 131/82 127/76  Pulse: 89 98 90 83  Resp: 14 18 18 16   Temp: 98.7 F (37.1 C) 98.5 F (36.9 C) 98.4 F (36.9 C) 98 F (36.7 C)  TempSrc: Oral Oral Oral Oral  SpO2: 96% 95% 99% 98%  Weight:      Height:       CBC:  Recent Labs  Lab 04/19/20 0458 04/20/20 0223  WBC 12.6* 9.9  HGB 13.6 13.8  HCT 40.8 39.7  MCV 86.1 84.6  PLT 422* 458*   Basic Metabolic Panel:  Recent Labs  Lab 04/19/20 0458 04/20/20 0223  NA 130* 132*  K 4.4 4.4  CL 97* 97*  CO2 23 24  GLUCOSE 127* 106*  BUN 23 24*  CREATININE 0.76 0.77  CALCIUM 9.4 9.4   IMAGING past 24 hours No results found.  PHYSICAL EXAM   Temp:  [98 F (36.7 C)-98.7 F (37.1 C)] 98 F (36.7 C) (03/06 2002) Pulse Rate:  [83-99] 83 (03/06 2002) Resp:  [14-20] 16 (03/06 2002) BP: (107-137)/(73-82) 127/76 (03/06 2002) SpO2:  [93 %-99 %] 98 % (03/06 2002)  General - Well nourished, well developed elderly Hispanic male, in no apparent distress.   Ophthalmologic - fundi not visualized due to noncooperation.  Heart - regular rhythm and rate  Neuro - awake alert, eyes open.  Answers orientation questions appropriately with mild dysarthria.  Fluent speech. However, not able to follow peripheral commands. Left visual neglect vs. Hemianopia. Right gaze, not able to cross midline. Right pupil 63mm and left 37mm, sluggish to light. Left facial droop. Left UE and LE mild withdraw to pain. RUE and RLE follows commands 4/5 strength throughout.  Sensation and diminished on the left, coordination not cooperative and gait not tested.  ASSESSMENT/PLAN Jayzon Taras a 75 y.o.malewith a history of recent urologic issues including UTI (01/22), urinary retention with foley cath placed, hematuria (likely traumatic due to self cath) s/p cystoscopy 04/01/20 by Dr. 04/03/20 [facility])  showing BPH with recommended surgical treatment. Surgical history includes appendectomy.He had been doing well up until 11:25am when he was noted to develop left hemiplegia. EMS was contacted, and took him to the [facility] ED where stroke workup was undertaken. He was noted to have left hemiplegia and neglect. NIHSS score 15.  He was not given tPA due to the hematuria. Transferred urgently to [facility] for thrombectomy which was aborted after multiple attempts which were unsuccessful.   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    MRI showed large right MCA infarct involving entire right MCA, consistent with malignant right MCA syndrome.  Small right SAH likely due to hemorrhagic conversion.    MRA head showed right M1 occluded.   CT repeat 2/23 right MCA large infarct with MLS 31mm  2D Echo EF 60-65%, No shunt   May consider 30 day cardiac event monitoring as outpt to rule out afib  LDL 118  HgbA1c 5.6  VTE prophylaxis - heparin subq  No antithrombotics PTA, now on ASA 325mg   Therapy recommendations:  SNF  Disposition:  pending   Cerebral Edema  Repeat HCT 2/19 shows worsening shift and edema, MLS at 69mm  CT repeat 2/23 right MCA large infarct with MLS 7mm  On 3% saline -> NS -> off  Neurosurgery, Dr. 13m, consulted and advised patient is not a candidate for surgical intervention.   Hypertensive urgency  Home meds:  Lisinopril 5mg  daily   BP on the low end  On amlodipine 10 -> d/c  Off lisinopril due to AKI . BP goal < 160  given hemorrhagic conversion . Long-term BP goal normotensive  HLD   No statin prior to admission  LDL 118, goal < 70  On lipitor 40mg  daily  Continue statin on discharge  Dysphagia   NPO after neurological decline  Swallow evaluation 3/3, passed for dysphagia nectar thick liquid diet  Calorie count Pending  May need PEG for SNF placement.  On nocturnal TF, Nutrition team following  Encourage po intake  Hyponatremia, improving  Na 131->130->132  MIVF NS at 20cc/hr and monitor po intake  FW discontinued  On TF nocturnal   Encourage po intake  Strict intake and output    Intractable hiccups, improved  On thorazine 12.5mg  tid  Added baclofen 5mg  tid  Continue to monitor  Urinary retention d/t BPH present on admission  Creatinine 0.93->2.08->0.74->.72->0.78->0.85  Developed urinary retention and UTI about a month ago necessitating trial of intermittent self cath (failed) and then subsequent foley placement  S/p cystoscopy 2/15 with recommended surgical intervention for BPH by Dr. [facility])  Hematuria appears transient after attempts at self cath per urology notes review. Hematuria not present on UA at admission.  Continue foley catheter, Monitor for hematuria. No signs of active bleeding. hgb stable. Foley output remains clear yellow.   Tiny R ankle fracture  Complaining of right ankle pain   Punctate bony density noted adjacent to the distal tip of the lateral malleolus on xray   Ortho PA on call recommends Cam boot, WBAT and follow up with , PA in 2 weeks which was requested. No formal consult required per ortho PA.    Other Stroke Risk Factors  ETOH use, alcohol level <10, family reports he drinks regularly   Other Active Problems  Hypokalemia - 3.4 ->3.5->3.8->4.7->4.4  Code status - DNR  Hospital day # 18  3/15, MD PhD Stroke Neurology 04/20/2020 9:17 PM  To contact Stroke Continuity provider, please refer  to [facility]. After hours, contact General Neurology

## 2020-04-20 NOTE — Progress Notes (Signed)
STROKE TEAM PROGRESS NOTE   INTERVAL HISTORY A male family member at bedside, not able to speak [place]. Pt lying in bed, eyes open, awake alert, still has right gaze and left hemiplegia. Passed swallow on diet but not adequate, will change TF to nocturnal TF. Encourage po intake. Na still low at 130.   Vitals:   04/19/20 1154 04/19/20 1608 04/19/20 1938 04/19/20 2353  BP: 108/74 133/76 126/80 107/73  Pulse: (!) 110 (!) 101 (!) 102 97  Resp: 18 18 16 20   Temp: 98.6 F (37 C) 98.3 F (36.8 C) 98.3 F (36.8 C) 98.1 F (36.7 C)  TempSrc: Axillary Oral Axillary Axillary  SpO2: 93% 95% 95% 93%  Weight:      Height:       CBC:  Recent Labs  Lab 04/15/20 0227 04/19/20 0458  WBC 12.1* 12.6*  HGB 13.1 13.6  HCT 38.5* 40.8  MCV 86.7 86.1  PLT 280 422*   Basic Metabolic Panel:  Recent Labs  Lab 04/18/20 1126 04/19/20 0458  NA 131* 130*  K 4.7 4.4  CL 97* 97*  CO2 24 23  GLUCOSE 127* 127*  BUN 30* 23  CREATININE 0.85 0.76  CALCIUM 9.6 9.4   IMAGING past 24 hours No results found.  PHYSICAL EXAM   Temp:  [98.1 F (36.7 C)-98.7 F (37.1 C)] 98.1 F (36.7 C) (03/05 2353) Pulse Rate:  [97-110] 97 (03/05 2353) Resp:  [16-20] 20 (03/05 2353) BP: (107-141)/(73-85) 107/73 (03/05 2353) SpO2:  [93 %-99 %] 93 % (03/05 2353)  General - Well nourished, well developed elderly Hispanic male, in no apparent distress.   Ophthalmologic - fundi not visualized due to noncooperation.  Heart - regular rhythm and rate  Neuro - awake alert, eyes open.  Answers orientation questions appropriately with mild dysarthria.  Fluent speech. However, not able to follow peripheral commands. Left visual neglect vs. Hemianopia. Right gaze, not able to cross midline. Right pupil 61mm and left 70mm, sluggish to light. Left facial droop. Left UE and LE mild withdraw to pain. RUE and RLE follows commands 4/5 strength throughout.  Sensation and diminished on the left, coordination not cooperative and gait  not tested.  ASSESSMENT/PLAN Gregory Osborn a 75 y.o.malewith a history of recent urologic issues including UTI (01/22), urinary retention with foley cath placed, hematuria (likely traumatic due to self cath) s/p cystoscopy 04/01/20 by Dr. 04/03/20 [facility])  showing BPH with recommended surgical treatment. Surgical history includes appendectomy.He had been doing well up until 11:25am when he was noted to develop left hemiplegia. EMS was contacted, and took him to the [facility] ED where stroke workup was undertaken. He was noted to have left hemiplegia and neglect. NIHSS score 15.  He was not given tPA due to the hematuria. Transferred urgently to [facility] for thrombectomy which was aborted after multiple attempts which were unsuccessful.   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    MRI showed large right MCA infarct involving entire right MCA, consistent with malignant right MCA syndrome.  Small right SAH likely due to hemorrhagic conversion.    MRA head showed right M1 occluded.   CT repeat 2/23 right MCA large infarct with MLS 60mm  2D Echo EF 60-65%, No shunt   May consider 30 day cardiac event monitoring  as outpt to rule out afib  LDL 118  HgbA1c 5.6  VTE prophylaxis - heparin subq  No antithrombotics PTA, now on ASA 325mg   Therapy recommendations:  SNF  Disposition:  pending   Cerebral Edema  Repeat HCT 2/19 shows worsening shift and edema, MLS at 73mm  CT repeat 2/23 right MCA large infarct with MLS 61mm  On 3% saline -> NS -> off  Neurosurgery, Dr. 13m, consulted and advised patient is not a candidate for surgical intervention.   Hypertensive urgency  Home meds:  Lisinopril 5mg  daily   BP on the low end  On amlodipine 10 -> d/c  Off  lisinopril due to AKI . BP goal < 160 given hemorrhagic conversion . Long-term BP goal normotensive  HLD   No statin prior to admission  LDL 118, goal < 70  On lipitor 40mg  daily  Continue statin on discharge  Dysphagia   NPO after neurological decline  Swallow evaluation 3/3, passed for dysphagia nectar thick liquid diet  Calorie count Pending  May need PEG for SNF placement.  On nocturnal TF, Nutrition team following  Hyponatremia  Na 131->130  MIVF NS at 20cc/hr and monitor po intake  Strict intake and output   Intractable hiccups   On thorazine 12.5mg  tid  Will add baclofen 5mg  tid  Continue to monitor  Urinary retention d/t BPH present on admission  Creatinine 0.93->2.08->0.74->.72->0.78->0.85  Developed urinary retention and UTI about a month ago necessitating trial of intermittent self cath (failed) and then subsequent foley placement  S/p cystoscopy 2/15 with recommended surgical intervention for BPH [facility])  Hematuria appears transient after attempts at self cath per urology notes review. Hematuria not present on UA at admission.  Continue foley catheter, Monitor for hematuria. No signs of active bleeding. hgb stable. Foley output remains clear yellow.   Tiny R ankle fracture  Complaining of right ankle pain   Punctate bony density noted adjacent to the distal tip of the lateral malleolus on xray   Ortho PA on call recommends Cam boot, WBAT and follow up with 3/15, PA in 2 weeks which was requested. No formal consult required per ortho PA.    Other Stroke Risk Factors  ETOH use, alcohol level <10, family reports he drinks regularly   Other Active Problems  Hypokalemia - 3.4 ->3.5->3.8->4.7->4.4  Code status - DNR  Hospital day # 18  Pete Glatter, MD PhD Stroke Neurology 04/20/2020 1:23 AM  To contact Stroke Continuity provider, please refer to [facility]. After hours, contact General Neurology

## 2020-04-21 DIAGNOSIS — I63511 Cerebral infarction due to unspecified occlusion or stenosis of right middle cerebral artery: Secondary | ICD-10-CM | POA: Diagnosis not present

## 2020-04-21 LAB — GLUCOSE, CAPILLARY
Glucose-Capillary: 108 mg/dL — ABNORMAL HIGH (ref 70–99)
Glucose-Capillary: 113 mg/dL — ABNORMAL HIGH (ref 70–99)
Glucose-Capillary: 123 mg/dL — ABNORMAL HIGH (ref 70–99)
Glucose-Capillary: 136 mg/dL — ABNORMAL HIGH (ref 70–99)
Glucose-Capillary: 92 mg/dL (ref 70–99)
Glucose-Capillary: 99 mg/dL (ref 70–99)

## 2020-04-21 LAB — CBC
HCT: 40.1 % (ref 39.0–52.0)
Hemoglobin: 13.4 g/dL (ref 13.0–17.0)
MCH: 28.7 pg (ref 26.0–34.0)
MCHC: 33.4 g/dL (ref 30.0–36.0)
MCV: 85.9 fL (ref 80.0–100.0)
Platelets: 494 10*3/uL — ABNORMAL HIGH (ref 150–400)
RBC: 4.67 MIL/uL (ref 4.22–5.81)
RDW: 12.1 % (ref 11.5–15.5)
WBC: 8.3 10*3/uL (ref 4.0–10.5)
nRBC: 0 % (ref 0.0–0.2)

## 2020-04-21 LAB — BASIC METABOLIC PANEL
Anion gap: 12 (ref 5–15)
BUN: 24 mg/dL — ABNORMAL HIGH (ref 8–23)
CO2: 22 mmol/L (ref 22–32)
Calcium: 9.2 mg/dL (ref 8.9–10.3)
Chloride: 97 mmol/L — ABNORMAL LOW (ref 98–111)
Creatinine, Ser: 0.78 mg/dL (ref 0.61–1.24)
GFR, Estimated: >60 mL/min (ref >60)
Glucose, Bld: 152 mg/dL — ABNORMAL HIGH (ref 70–99)
Potassium: 4.2 mmol/L (ref 3.5–5.1)
Sodium: 131 mmol/L — ABNORMAL LOW (ref 135–145)

## 2020-04-21 MED ORDER — BACLOFEN 10 MG PO TABS
5.0000 mg | ORAL_TABLET | Freq: Three times a day (TID) | ORAL | Status: DC
  Administered 2020-04-21 – 2020-04-25 (×11): 5 mg
  Filled 2020-04-21 (×11): qty 1

## 2020-04-21 MED ORDER — CHLORPROMAZINE HCL 25 MG PO TABS
12.5000 mg | ORAL_TABLET | Freq: Three times a day (TID) | ORAL | Status: DC
  Administered 2020-04-21 – 2020-04-25 (×11): 12.5 mg
  Filled 2020-04-21 (×14): qty 1

## 2020-04-21 NOTE — Anesthesia Preprocedure Evaluation (Addendum)
Anesthesia Evaluation  Patient identified by MRN, date of birth, ID band Patient awake    Reviewed: Allergy & Precautions, NPO status , Patient's Chart, lab work & pertinent test results  Airway Mallampati: II  TM Distance: >3 FB Neck ROM: Full    Dental  (+) Teeth Intact   Pulmonary neg pulmonary ROS,    Pulmonary exam normal        Cardiovascular hypertension, Pt. on medications  Rhythm:Regular Rate:Normal     Neuro/Psych CVA, Residual Symptoms negative psych ROS   GI/Hepatic negative GI ROS, Neg liver ROS,   Endo/Other  negative endocrine ROS  Renal/GU negative Renal ROS   Urinary retention     Musculoskeletal negative musculoskeletal ROS (+)   Abdominal (+)  Abdomen: soft. Bowel sounds: normal.  Peds  Hematology negative hematology ROS (+)   Anesthesia Other Findings   Reproductive/Obstetrics                            Anesthesia Physical Anesthesia Plan  ASA: IV  Anesthesia Plan: MAC   Post-op Pain Management:    Induction: Intravenous  PONV Risk Score and Plan: 1 and Propofol infusion  Airway Management Planned: Simple Face Mask, Natural Airway and Nasal Cannula  Additional Equipment: None  Intra-op Plan:   Post-operative Plan:   Informed Consent: I have reviewed the patients History and Physical, chart, labs and discussed the procedure including the risks, benefits and alternatives for the proposed anesthesia with the patient or authorized representative who has indicated his/her understanding and acceptance.   Patient has DNR.   Dental advisory given  Plan Discussed with: CRNA  Anesthesia Plan Comments: (ECHO 04/04/20: 1. Left ventricular ejection fraction, by estimation, is 60 to 65%. The  left ventricle has normal function. The left ventricle has no regional  wall motion abnormalities. Left ventricular diastolic parameters are  consistent with Grade I  diastolic  dysfunction (impaired relaxation).  2. Right ventricular systolic function is normal. The right ventricular  size is normal.  3. The mitral valve is normal in structure. No evidence of mitral valve  regurgitation. No evidence of mitral stenosis.  4. The aortic valve is normal in structure. Aortic valve regurgitation is  not visualized. No aortic stenosis is present.  5. The inferior vena cava is normal in size with greater than 50%  respiratory variability, suggesting right atrial pressure of 3 mmHg.  Lab Results      Component                Value               Date                      WBC                      8.3                 04/21/2020                HGB                      13.4                04/21/2020                HCT  40.1                04/21/2020                MCV                      85.9                04/21/2020                PLT                      494 (H)             04/21/2020           Lab Results      Component                Value               Date                      NA                       131 (L)             04/21/2020                K                        4.2                 04/21/2020                CO2                      22                  04/21/2020                GLUCOSE                  152 (H)             04/21/2020                BUN                      24 (H)              04/21/2020                CREATININE               0.78                04/21/2020                CALCIUM                  9.2                 04/21/2020                GFRNONAA                 >60                 04/21/2020          )  Anesthesia Quick Evaluation  

## 2020-04-21 NOTE — TOC Progression Note (Signed)
Transition of Care [facility]) - Progression Note    Patient Details  Name: Gregory Osborn MRN: [id number] Date of Birth: Jul 08, 1945  [facility]) CM/SW Contact  Kermit Balo, RN Phone Number: 04/21/2020, 1:45 PM  Clinical Narrative:    Plan is for PEG prior to d/c to SNF. CM provided the family the bed offers for SNF rehab. Daughter asked to look them over with her brother and they will give [place] their choice. TOC following.    Expected Discharge Plan: Skilled Nursing Facility Barriers to Discharge: Continued Medical Work up  Expected Discharge Plan and Services Expected Discharge Plan: Skilled Nursing Facility   Discharge Planning Services: CM Consult   Living arrangements for the past 2 months: Single Family Home                                       Social Determinants of Health (SDOH) Interventions    Readmission Risk Interventions No flowsheet data found.

## 2020-04-21 NOTE — Progress Notes (Addendum)
STROKE TEAM PROGRESS NOTE   INTERVAL HISTORY No acute events overnight. Wife and daughter at bedside. He was awake sitting up in bed. His neuro exam is unchanged- left hemiplegia and neglect and right gaze. He continues to eat very little PO. Plan is still to discharge to SNF when able to. Discussed with family that PEG tube would most likely be needed before discharge because he is still unable to take in enough PO. They will call family and make a decision about PEG tube placement this afternoon or tomorrow. Stressed the need to get him to a SNF for more in-depth Rehab and that he would not be able to go while still having the CorTeck. They reported understanding.  Vitals:   04/21/20 0300 04/21/20 0400 04/21/20 0420 04/21/20 0805  BP:   123/70 125/71  Pulse:   98 90  Resp: 17 19 15 14   Temp:   (!) 97.5 F (36.4 C) 97.9 F (36.6 C)  TempSrc:   Oral Oral  SpO2:   96% 96%  Weight:      Height:       CBC:  Recent Labs  Lab 04/20/20 0223 04/21/20 0345  WBC 9.9 8.3  HGB 13.8 13.4  HCT 39.7 40.1  MCV 84.6 85.9  PLT 458* 494*   Basic Metabolic Panel:  Recent Labs  Lab 04/20/20 0223 04/21/20 0345  NA 132* 131*  K 4.4 4.2  CL 97* 97*  CO2 24 22  GLUCOSE 106* 152*  BUN 24* 24*  CREATININE 0.77 0.78  CALCIUM 9.4 9.2   Lipid Panel: No results for input(s): CHOL, TRIG, HDL, CHOLHDL, VLDL, LDLCALC in the last 168 hours. HgbA1c: No results for input(s): HGBA1C in the last 168 hours. Urine Drug Screen: No results for input(s): LABOPIA, COCAINSCRNUR, LABBENZ, AMPHETMU, THCU, LABBARB in the last 168 hours.  Alcohol Level No results for input(s): ETH in the last 168 hours.  IMAGING past 24 hours No results found.  PHYSICAL EXAM Blood pressure 125/71, pulse 90, temperature 97.9 F (36.6 C), temperature source Oral, resp. rate 14, height 5\' 5"  (1.651 m), weight 76.3 kg, SpO2 96 %.  General: alert and awake, elderly Hispanic male, no apparent distress  Lungs: Symmetrical chest  rise, no labored breathing  Cardio: Regular rate and Rhythm  Abdomen: soft, non-tender  Neuro: Alert and awake. Answers questions appropriately, mild dysarthria present, fluent speech, Right Gaze unable to cross midline Left facial droop Strength: Left Upper and Lower extremities-unable to lift, withdraws to noxious stimuli Right Upper and Lower Extremities: 4/5  ASSESSMENT/PLAN Gregory Osborn is a 75 y.o. male with history of recent urologic issues including UTI (01/22), urinary retention with foley cath placed, hematuria (likely traumatic due to self cath) s/p cystoscopy 04/01/20 by Dr[facility]12-[facility] showing BPH with recommended surgical treatment. Surgical history includes appendectomy.He had been doing well up until 11:25am when he was noted to develop left hemiplegia. EMS was contacted, and took him to the [facility] ED where stroke workup was undertaken. He was noted to have left hemiplegia and neglect. NIHSS score 15. He was not given tPA due to the hematuria. Transferred urgently to [facility] for thrombectomy which was aborted after multiple attempts which were unsuccessful.   CorTeck still in place. Poor PO intake- Friday and Saturday only ate 60% and 50% of meals. Family will make a decision about PEG tube placement this afternoon or tomorrow. Most likely will have placement tomorrow or Wednesday.   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   MRI showed large right MCA infarct involving entire right MCA, consistent with malignant right MCA syndrome. Small right SAH likely due to hemorrhagic conversion.   MRA head showed right M1 occluded.   CT repeat 2/23 right MCA large infarct with MLS 97mm  2D Echo EF 60-65%, No shunt   May consider 30 day cardiac  event monitoring as outpt to rule out afib  LDL 118  HgbA1c 5.6  VTE prophylaxis - heparin subq  No antithrombotics PTA, now on ASA 325mg   Therapy recommendations:  SNF  Disposition:  pending   Cerebral Edema  Repeat HCT 2/19 shows worsening shift and edema, MLS at 33mm  CT repeat 2/23 right MCA large infarct with MLS 90mm  On 3% saline -> NS -> off  Neurosurgery, Dr. 13m, consulted and advised patient is not a candidate for surgical intervention.   Hypertensive urgency  Home meds:  Lisinopril 5mg  daily   BP on the low end  On amlodipine 10 -> d/c  Off lisinopril due to AKI  BP goal < 160 given hemorrhagic conversion  Long-term BP goal normotensive  HLD   No statin prior to admission  LDL 118, goal < 70  On lipitor 40mg  daily  Continue statin on discharge  Dysphagia   NPO after neurological decline  Swallow evaluation 3/3, passed for dysphagia nectar thick liquid diet  Calorie count Pending  May need PEG for SNF placement.  Family initially hesitant but finally agreed.  General surgery contacted.  On nocturnal TF, Nutrition team following  Encourage po intake  Hyponatremia, improving  Na 131->130->132  MIVF NS at 20cc/hr and monitor po intake  FW discontinued  On TF nocturnal   Encourage po intake  Strict intake and output    Intractable hiccups, improved  On thorazine 12.5mg  tid  Added baclofen 5mg  tid  Continue to monitor  Urinary retention d/t BPH present on admission  Creatinine 0.93->2.08->0.74->.72->0.78->0.85  Developed urinary retention and UTI about a month ago necessitating trial of intermittent self cath (failed) and then subsequent foley placement  S/p cystoscopy 2/15 with recommended surgical intervention for BPH by Dr. [facility])  Hematuria appears transient after attempts at self cath per urology notes review. Hematuria not present on UA at admission.  Continue foley catheter, Monitor for  hematuria. No signs of active bleeding. hgb stable. Foley output remains clear yellow.   Tiny R ankle fracture  Complaining of right ankle pain   Punctate bony density noted adjacent to the distal tip of the lateral malleolus on xray   Ortho PA on call recommends Cam boot, WBAT and follow up with , PA in 2 weeks which was requested. No formal consult required per ortho PA.    Other Stroke Risk Factors  ETOH use, alcohol level <10, family reports he drinks regularly   Other Active Problems  Hypokalemia - 3.4 ->3.5->3.8->4.7->4.4>4.2  Code status - DNR  Hospital day # 19    To contact Stroke Continuity provider, please refer to 3/15. After hours, contact General Neurology   Attending Neurohospitalist Addendum Patient seen and examined with APP/Resident. Agree with the history and physical as documented above. Agree with the plan as documented, which I helped formulate. I have independently reviewed the chart, obtained history, review of systems and examined  the patient.I have personally reviewed pertinent head/neck/spine imaging (CT/MRI).  The family has decided to pursue PEG tube. Consulted general surgery-Dr. Janee Morn.  General surgery will evaluate the patient today and possibly PEG tube placement tomorrow. Once he has a PEG tube, can be transferred to SNF at that time. Spoke with the daughter, wife at bedside.  Answered all the questions.   Please feel free to call with any questions. --- Milon Dikes, MD [facility] Pager: [phone]  If 7pm to 7am, please call on call as listed on AMION.

## 2020-04-21 NOTE — Progress Notes (Signed)
PT Cancellation Note  Patient Details Name: Gregory Osborn MRN: [id number] DOB: 02-27-1945   Cancelled Treatment:    Reason Eval/Treat Not Completed: Other (comment). Pt with nursing at bedside for procedure/care at treatment attempt.  Will follow up as time allows today vs another date. Acute PT to continue.   Sallyanne Kuster, PTA, CLT Acute Rehab Services [phone] 04/21/20, 11:56 AM   Sallyanne Kuster 04/21/2020, 11:54 AM

## 2020-04-21 NOTE — Consult Note (Signed)
Reason for Consult:PEG placement Referring Physician: [name] is an 75 y.o. male.  HPI: 75yo M admitted with R MCA infarct and resultant L hemiplegia and neglect. He has passed for a diet but is not taking in enough. Currently has CorTrak. He cannot provide history.  History reviewed. No pertinent past medical history.  Past Surgical History:  Procedure Laterality Date  . IR CT HEAD LTD  04/03/2020  . IR CT HEAD LTD  04/03/2020  . IR PERCUTANEOUS ART THROMBECTOMY/INFUSION INTRACRANIAL INC DIAG ANGIO  04/03/2020  . RADIOLOGY WITH ANESTHESIA N/A 04/02/2020   Procedure: IR WITH ANESTHESIA;  Surgeon: Baldemar Lenis, MD;  Location: [facility] OR;  Service: Radiology;  Laterality: N/A;    History reviewed. No pertinent family history.  Social History:  reports that he has never smoked. He has never used smokeless tobacco. No history on file for alcohol use and drug use.  Allergies: No Known Allergies  Medications: I have reviewed the patient's current medications.  Results for orders placed or performed during the hospital encounter of 04/02/20 (from the past 48 hour(s))  Glucose, capillary     Status: Abnormal   Collection Time: 04/19/20  4:24 PM  Result Value Ref Range   Glucose-Capillary 112 (H) 70 - 99 mg/dL    Comment: Glucose reference range applies only to samples taken after fasting for at least 8 hours.  Glucose, capillary     Status: Abnormal   Collection Time: 04/19/20  8:19 PM  Result Value Ref Range   Glucose-Capillary 118 (H) 70 - 99 mg/dL    Comment: Glucose reference range applies only to samples taken after fasting for at least 8 hours.   Comment 1 Notify RN    Comment 2 Document in Chart   Glucose, capillary     Status: Abnormal   Collection Time: 04/20/20 12:22 AM  Result Value Ref Range   Glucose-Capillary 140 (H) 70 - 99 mg/dL    Comment: Glucose reference range applies only to samples taken after fasting for at least 8 hours.   Comment 1  Notify RN    Comment 2 Document in Chart   Basic metabolic panel     Status: Abnormal   Collection Time: 04/20/20  2:23 AM  Result Value Ref Range   Sodium 132 (L) 135 - 145 mmol/L   Potassium 4.4 3.5 - 5.1 mmol/L   Chloride 97 (L) 98 - 111 mmol/L   CO2 24 22 - 32 mmol/L   Glucose, Bld 106 (H) 70 - 99 mg/dL    Comment: Glucose reference range applies only to samples taken after fasting for at least 8 hours.   BUN 24 (H) 8 - 23 mg/dL   Creatinine, Ser 8.14 0.61 - 1.24 mg/dL   Calcium 9.4 8.9 - 48.1 mg/dL   GFR, Estimated >85 >63 mL/min    Comment: (NOTE) Calculated using the CKD-EPI Creatinine Equation (2021)    Anion gap 11 5 - 15    Comment: Performed at [facility], [address] [place], [place] [address]  CBC     Status: Abnormal   Collection Time: 04/20/20  2:23 AM  Result Value Ref Range   WBC 9.9 4.0 - 10.5 K/uL   RBC 4.69 4.22 - 5.81 MIL/uL   Hemoglobin 13.8 13.0 - 17.0 g/dL   HCT 26.3 78.5 - 88.5 %   MCV 84.6 80.0 - 100.0 fL   MCH 29.4 26.0 - 34.0 pg   MCHC  34.8 30.0 - 36.0 g/dL   RDW 59.5 63.8 - 75.6 %   Platelets 458 (H) 150 - 400 K/uL   nRBC 0.0 0.0 - 0.2 %    Comment: Performed at [facility], [address] [place], [place] [address]  Glucose, capillary     Status: Abnormal   Collection Time: 04/20/20  4:07 AM  Result Value Ref Range   Glucose-Capillary 145 (H) 70 - 99 mg/dL    Comment: Glucose reference range applies only to samples taken after fasting for at least 8 hours.   Comment 1 Notify RN    Comment 2 Document in Chart   Glucose, capillary     Status: Abnormal   Collection Time: 04/20/20  8:03 AM  Result Value Ref Range   Glucose-Capillary 134 (H) 70 - 99 mg/dL    Comment: Glucose reference range applies only to samples taken after fasting for at least 8 hours.  Glucose, capillary     Status: Abnormal   Collection Time: 04/20/20  1:07 PM  Result Value Ref Range   Glucose-Capillary 167 (H) 70 - 99 mg/dL    Comment: Glucose  reference range applies only to samples taken after fasting for at least 8 hours.  Glucose, capillary     Status: Abnormal   Collection Time: 04/20/20  4:52 PM  Result Value Ref Range   Glucose-Capillary 100 (H) 70 - 99 mg/dL    Comment: Glucose reference range applies only to samples taken after fasting for at least 8 hours.  Glucose, capillary     Status: Abnormal   Collection Time: 04/20/20  8:03 PM  Result Value Ref Range   Glucose-Capillary 117 (H) 70 - 99 mg/dL    Comment: Glucose reference range applies only to samples taken after fasting for at least 8 hours.   Comment 1 Notify RN    Comment 2 Document in Chart   Glucose, capillary     Status: Abnormal   Collection Time: 04/20/20 11:59 PM  Result Value Ref Range   Glucose-Capillary 145 (H) 70 - 99 mg/dL    Comment: Glucose reference range applies only to samples taken after fasting for at least 8 hours.   Comment 1 Notify RN    Comment 2 Document in Chart   Basic metabolic panel     Status: Abnormal   Collection Time: 04/21/20  3:45 AM  Result Value Ref Range   Sodium 131 (L) 135 - 145 mmol/L   Potassium 4.2 3.5 - 5.1 mmol/L   Chloride 97 (L) 98 - 111 mmol/L   CO2 22 22 - 32 mmol/L   Glucose, Bld 152 (H) 70 - 99 mg/dL    Comment: Glucose reference range applies only to samples taken after fasting for at least 8 hours.   BUN 24 (H) 8 - 23 mg/dL   Creatinine, Ser 5.18 0.61 - 1.24 mg/dL   Calcium 9.2 8.9 - 84.1 mg/dL   GFR, Estimated >66 >06 mL/min    Comment: (NOTE) Calculated using the CKD-EPI Creatinine Equation (2021)    Anion gap 12 5 - 15    Comment: Performed at [facility], [address] [place], [place] [address]  CBC     Status: Abnormal   Collection Time: 04/21/20  3:45 AM  Result Value Ref Range   WBC 8.3 4.0 - 10.5 K/uL   RBC 4.67 4.22 - 5.81 MIL/uL   Hemoglobin 13.4 13.0 - 17.0 g/dL   HCT 10.9 32.3 -  52.0 %   MCV 85.9 80.0 - 100.0 fL   MCH 28.7 26.0 - 34.0 pg   MCHC 33.4 30.0 - 36.0 g/dL    RDW 37.1 06.2 - 69.4 %   Platelets 494 (H) 150 - 400 K/uL   nRBC 0.0 0.0 - 0.2 %    Comment: Performed at [facility], [address] [place], [place] [address]  Glucose, capillary     Status: Abnormal   Collection Time: 04/21/20  4:25 AM  Result Value Ref Range   Glucose-Capillary 136 (H) 70 - 99 mg/dL    Comment: Glucose reference range applies only to samples taken after fasting for at least 8 hours.   Comment 1 Notify RN    Comment 2 Document in Chart   Glucose, capillary     Status: Abnormal   Collection Time: 04/21/20  8:10 AM  Result Value Ref Range   Glucose-Capillary 123 (H) 70 - 99 mg/dL    Comment: Glucose reference range applies only to samples taken after fasting for at least 8 hours.  Glucose, capillary     Status: Abnormal   Collection Time: 04/21/20 12:24 PM  Result Value Ref Range   Glucose-Capillary 113 (H) 70 - 99 mg/dL    Comment: Glucose reference range applies only to samples taken after fasting for at least 8 hours.    No results found.  Review of Systems Blood pressure 123/70, pulse 87, temperature 97.8 F (36.6 C), temperature source Oral, resp. rate 17, height 5\' 5"  (1.651 m), weight 76.3 kg, SpO2 99 %. Physical Exam Constitutional:      General: He is not in acute distress. HENT:     Nose: Nose normal.     Comments: CorTrak    Mouth/Throat:     Mouth: Mucous membranes are moist.  Cardiovascular:     Rate and Rhythm: Normal rate and regular rhythm.  Pulmonary:     Effort: Pulmonary effort is normal.     Breath sounds: Normal breath sounds.  Abdominal:     General: Abdomen is flat. There is no distension.     Palpations: Abdomen is soft. There is no mass.     Tenderness: There is no abdominal tenderness. There is no guarding.  Musculoskeletal:     Cervical back: Neck supple.  Skin:    General: Skin is warm.     Capillary Refill: Capillary refill takes less than 2 seconds.  Neurological:     Comments: L hemiplegia and neglect, Will  answer Y/N question in Spanish     Assessment/Plan: S/P R MCA infarct and resultant L hemiplegia and neglect - need for enteral access for nutrition and meds. Plan SNF placement. I called his son and discussed the recommended PEG placement including the procedure, risks, and benefits. He says the family was still deciding about the PEG but they will discuss this evening. I have scheduled it for tomorrow at 11 in Endo with anesthesia support should they decide to go ahead. Hold TF at MN.  04/21/2020, 3:05 PM

## 2020-04-21 NOTE — Progress Notes (Signed)
Nutrition Follow-up  DOCUMENTATION CODES:   Not applicable  INTERVENTION:   -Continue Ensure Enlive po BID, each supplement provides 350 kcal and 20 grams of protein  -Continue nocturnal TF:  Osmolite 1.2 @ 70 ml/hr x 12 hours via cortrak tube  45 ml Prosource TF daily  Tube feeding regimen provides 1048 kcal (58% of needs), 58 grams of protein, and 689 ml of H2O.   NUTRITION DIAGNOSIS:   Inadequate oral intake related to lethargy/confusion,dysphagia as evidenced by NPO status.  Ongoing  GOAL:   Patient will meet greater than or equal to 90% of their needs  Progressing   MONITOR:   PO intake,Supplement acceptance,Diet advancement,Skin,TF tolerance,Weight trends,I & O's,Labs  REASON FOR ASSESSMENT:   Consult Enteral/tube feeding initiation and management  ASSESSMENT:   75 year old male who presented on 2/16 with stroke with left hemiplegia. PMH of UTI, urinary retention, hematuria, and appendectomy.  Reviewed I/O's: -2.6 L x 24 hours and -10.3 L since 04/07/20  UOP: 3.1 L x 24 hours  Pt unavailable at time of visit.   No calorie count data to assess. Per MD notes, pt eating minimally. Noted pt consuming less than 50% of meals. Per general surgery notes, plan for PEG placement tomorrow (04/22/20).  Medications reviewed and include miralax and senokot.  Labs reviewed: Na: 131, CBGS: 108-136 (inpatient orders for glycemic control are none).   Diet Order:   Diet Order            DIET - DYS 1 Room service appropriate? Yes; Fluid consistency: Nectar Thick  Diet effective now                 EDUCATION NEEDS:   No education needs have been identified at this time  Skin:  Skin Assessment: Reviewed RN Assessment Skin Integrity Issues:: Incisions Incisions: right groin  Last BM:  04/21/20  Height:   Ht Readings from Last 1 Encounters:  04/02/20 5\' 5"  (1.651 m)    Weight:   Wt Readings from Last 1 Encounters:  04/02/20 76.3 kg   BMI:  Body mass  index is 27.99 kg/m.  Estimated Nutritional Needs:   Kcal:  1800-2000  Protein:  85-100 grams  Fluid:  1.8-2.0 L    04/04/20, RD, LDN, CDCES Registered Dietitian II Certified Diabetes Care and Education Specialist Please refer to [facility] for RD and/or RD on-call/weekend/after hours pager

## 2020-04-22 ENCOUNTER — Encounter (HOSPITAL_COMMUNITY)
Admission: EM | Disposition: A | Discharge status: Skilled Nursing Facility | Payer: Self-pay | Source: Home / Self Care | Attending: Neurology

## 2020-04-22 ENCOUNTER — Encounter (HOSPITAL_COMMUNITY): Payer: Self-pay | Admitting: Neurology

## 2020-04-22 ENCOUNTER — Inpatient Hospital Stay (HOSPITAL_COMMUNITY): Payer: Medicare HMO | Admitting: Anesthesiology

## 2020-04-22 DIAGNOSIS — I63511 Cerebral infarction due to unspecified occlusion or stenosis of right middle cerebral artery: Secondary | ICD-10-CM | POA: Diagnosis not present

## 2020-04-22 HISTORY — PX: ESOPHAGOGASTRODUODENOSCOPY (EGD) WITH PROPOFOL: SHX5813

## 2020-04-22 HISTORY — PX: PEG PLACEMENT: SHX5437

## 2020-04-22 LAB — CBC
HCT: 41 % (ref 39.0–52.0)
Hemoglobin: 13.7 g/dL (ref 13.0–17.0)
MCH: 28.7 pg (ref 26.0–34.0)
MCHC: 33.4 g/dL (ref 30.0–36.0)
MCV: 85.8 fL (ref 80.0–100.0)
Platelets: 517 10*3/uL — ABNORMAL HIGH (ref 150–400)
RBC: 4.78 MIL/uL (ref 4.22–5.81)
RDW: 12.1 % (ref 11.5–15.5)
WBC: 9.4 10*3/uL (ref 4.0–10.5)
nRBC: 0 % (ref 0.0–0.2)

## 2020-04-22 LAB — BASIC METABOLIC PANEL
Anion gap: 11 (ref 5–15)
BUN: 26 mg/dL — ABNORMAL HIGH (ref 8–23)
CO2: 24 mmol/L (ref 22–32)
Calcium: 9.9 mg/dL (ref 8.9–10.3)
Chloride: 97 mmol/L — ABNORMAL LOW (ref 98–111)
Creatinine, Ser: 0.81 mg/dL (ref 0.61–1.24)
GFR, Estimated: >60 mL/min (ref >60)
Glucose, Bld: 90 mg/dL (ref 70–99)
Potassium: 4 mmol/L (ref 3.5–5.1)
Sodium: 132 mmol/L — ABNORMAL LOW (ref 135–145)

## 2020-04-22 LAB — SURGICAL PCR SCREEN
MRSA, PCR: NEGATIVE
Staphylococcus aureus: POSITIVE — AB

## 2020-04-22 LAB — GLUCOSE, CAPILLARY
Glucose-Capillary: 98 mg/dL (ref 70–99)
Glucose-Capillary: 102 mg/dL — ABNORMAL HIGH (ref 70–99)
Glucose-Capillary: 94 mg/dL (ref 70–99)
Glucose-Capillary: 106 mg/dL — ABNORMAL HIGH (ref 70–99)
Glucose-Capillary: 132 mg/dL — ABNORMAL HIGH (ref 70–99)

## 2020-04-22 SURGERY — ESOPHAGOGASTRODUODENOSCOPY (EGD) WITH PROPOFOL
Anesthesia: Monitor Anesthesia Care

## 2020-04-22 MED ORDER — PROPOFOL 500 MG/50ML IV EMUL
INTRAVENOUS | Status: DC | PRN
  Administered 2020-04-22: 100 ug/kg/min via INTRAVENOUS

## 2020-04-22 MED ORDER — LIDOCAINE 2% (20 MG/ML) 5 ML SYRINGE
INTRAMUSCULAR | Status: DC | PRN
  Administered 2020-04-22: 100 mg via INTRAVENOUS

## 2020-04-22 MED ORDER — PROPOFOL 10 MG/ML IV BOLUS
INTRAVENOUS | Status: DC | PRN
  Administered 2020-04-22 (×3): 20 mg via INTRAVENOUS

## 2020-04-22 MED ORDER — LACTATED RINGERS IV SOLN: INTRAVENOUS | Status: DC | PRN

## 2020-04-22 NOTE — Progress Notes (Addendum)
STROKE TEAM PROGRESS NOTE   INTERVAL HISTORY Last night family decided that PEG tube placement should be done. Surgery will place PEG tube today.  He is sitting in bed. He reports that he is feeling ok today. Asked why he is not able to eat or drink. Discussed that he will need to be put to sleep for his procedure today and that any food or drink in his stomach would pose an aspiration or choking hazard. He expressed understanding. He asked when he would be able to go to Rehab. Discussed most likely tomorrow. He had no other concerns at present.  His labs are mostly steady. His neuro exam is unchanged from yesterday. Will have PEG tube placed today. Hopefully will be able to discharge to SNF soon to begin Rehab.  Vitals:   04/21/20 1538 04/21/20 1947 04/21/20 2346 04/22/20 0338  BP: (!) 111/54 115/72 114/75 125/80  Pulse: 86 77 84 85  Resp: 18 17 17 17   Temp: 97.9 F (36.6 C) 98.2 F (36.8 C) 98.1 F (36.7 C) 98.6 F (37 C)  TempSrc: Oral Oral Oral Oral  SpO2: 99% 96% 97% 97%  Weight:      Height:       CBC:  Recent Labs  Lab 04/21/20 0345 04/22/20 0340  WBC 8.3 9.4  HGB 13.4 13.7  HCT 40.1 41.0  MCV 85.9 85.8  PLT 494* 517*   Basic Metabolic Panel:  Recent Labs  Lab 04/21/20 0345 04/22/20 0340  NA 131* 132*  K 4.2 4.0  CL 97* 97*  CO2 22 24  GLUCOSE 152* 90  BUN 24* 26*  CREATININE 0.78 0.81  CALCIUM 9.2 9.9   Lipid Panel: No results for input(s): CHOL, TRIG, HDL, CHOLHDL, VLDL, LDLCALC in the last 168 hours. HgbA1c: No results for input(s): HGBA1C in the last 168 hours. Urine Drug Screen: No results for input(s): LABOPIA, COCAINSCRNUR, LABBENZ, AMPHETMU, THCU, LABBARB in the last 168 hours.  Alcohol Level No results for input(s): ETH in the last 168 hours.  IMAGING past 24 hours No results found.  PHYSICAL EXAM Blood pressure 125/80, pulse 85, temperature 98.6 F (37 C), temperature source Oral, resp. rate 17, height 5\' 5"  (1.651 m), weight 76.3 kg, SpO2  97 %.  General: alert and awake, elderly Hispanic male, no apparent distress  Lungs: Symmetrical chest rise, no labored breathing  Cardio: Regular Rate and Rhythm  Abdomen: Soft, non-tender  Neuro: Alert and awake. Answers questions appropriately, mild dysarthria present, fluent speech, Right Gaze unable to cross midline Left facial droop Strength: Left Upper and Lower extremities-unable to lift, withdraws to noxious stimuli Right Upper and Lower Extremities: 4/5  ASSESSMENT/PLAN [name] is a 75 y.o. male with history of recent urologic issues including UTI (01/22), urinary retention with foley cath placed, hematuria (likely traumatic due to self cath) s/p cystoscopy 04/01/20 by Dr. [facility]12-[facility] showing BPH with recommended surgical treatment. Surgical history includes appendectomy.He had been doing well up until 11:25am when he was noted to develop left hemiplegia. EMS was contacted, and took him to the [facility] ED where stroke workup was undertaken. He was noted to have left hemiplegia and neglect. NIHSS score 15. He was not given tPA due to the hematuria. Transferred urgently to [facility] for thrombectomy which was aborted after multiple attempts which were unsuccessful.   Still has CorTrak in place but is NPO since midnight because he will have PEG tube placed by Surgery today. Should be ready for discharge  to SNF as soon as tomorrow. Neuro exam is unchanged. Otherwise he is stable. Will make no changes at this time.  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   MRI showed large right MCA infarct involving entire right MCA, consistent with malignant right MCA syndrome. Small right SAH likely due to hemorrhagic conversion.   MRA head showed right M1  occluded.   CT repeat 2/23 right MCA large infarct with MLS 77mm  2D EchoEF 60-65%, No shunt  May consider 30 day cardiac event monitoring as outpt to rule out afib  LDL118  HgbA1c5.6  VTE prophylaxis - heparin subq  No antithrombotics PTA, now on ASA 325mg   Therapy recommendations: SNF  Disposition: Pending PEG tube placement prior to SNF placement.  Cerebral Edema  Repeat HCT 2/19 shows worsening shift and edema, MLS at 84mm  CT repeat 2/23 right MCA large infarct with MLS 54mm  On 3% saline -> NS -> off  Neurosurgery, Dr. 13m, consulted and advised patient is not a candidate for surgical intervention.   Clinically seems to have resolved cerebral edema  Hypertensive urgency  Home meds: Lisinopril 5mg  daily  BP on the low end  On amlodipine 10 -> d/c  Off lisinopril due to AKI  BP goal < 160 given hemorrhagic conversion  Long-term BP goal normotensive  HLD   No statin prior to admission  LDL 118, goal < 70  On lipitor 40mg  daily  Continue statin on discharge  Dysphagia   NPO after neurological decline  Swallow evaluation 3/3, passed for dysphagia nectar thick liquid diet  Will have PEG tube placed today.  On nocturnal TF, Nutrition team following  Encourage po intake  Hyponatremia, improving  Na 131->130->132  MIVF NS at 20cc/hr and monitor po intake  FW discontinued  On TF nocturnal   Encourage po intake  Strict intake and output   Trend Na  Intractable hiccups, improved  On thorazine 12.5mg  tid  Addedbaclofen 5mg  tid  Continue to monitor  Urinary retention d/t BPH present on admission  Creatinine 0.93->2.08->0.74->.72->0.78->0.85>0.81  Developed urinary retention and UTI about a month ago necessitating trial of intermittent self cath (failed) and then subsequent foley placement  S/p cystoscopy 2/15 with recommended surgical intervention for BPH [facility]  Hematuria appears transient  after attempts at self cath per urology notes review. Hematuria not present on UA at admission.  Continue foley catheter, Monitor for hematuria. No signs of active bleeding. hgb stable. Foley output remains clear yellow.   Tiny R ankle fracture  Complaining of right ankle pain   Punctate bony density noted adjacent to the distal tip of the lateral malleolus on xray   Ortho PA on call recommends Cam boot, WBAT and follow up with 3/15, PA in 2 weeks which was requested. No formal consult required per ortho PA.   Other Stroke Risk Factors  ETOH use, alcohol level <10, family reports he drinks regularly   Other Active Problems  Hypokalemia -3.4 ->3.5->3.8->4.7->4.4>4.2  Code status - DNR   Hospital day # 20  Attending Neurohospitalist Addendum Patient seen and examined with APP/Resident. Agree with the history and physical as documented above. Agree with the plan as documented, which I helped formulate. I have independently reviewed the chart, obtained history, review of systems and examined the  patient.I have personally reviewed pertinent head/neck/spine imaging (CT/MRI). Please feel free to call with any questions.  -- Milon Dikes, MD Stroke Neurology Pager: [phone]    To contact Stroke Continuity provider, please refer to [email or web address]. After hours, contact General Neurology

## 2020-04-22 NOTE — Progress Notes (Signed)
Occupational Therapy Treatment Patient Details Name: Gregory Osborn MRN: [id number] DOB: 01/22/1946 Today's Date: 04/22/2020    History of present illness 75 yo male admmitted to [facility] ED on 2/16 with L hemiplegia and facial drop, imaging reveals R M1 occlusion with attempted thrombectomy (unsuccessful). Small R SAH, likely due to hemorrhagic conversion. ETT 2/16-present. PMH includes HTN, urinary retention with frequent UTIs, BPH, hematuria.   OT comments  Patient supine in bed and agreeable to OT/PT session, Graciela present for translation.  He continues to require max assist +2 for bed mobility, mod- max assist for EOB sitting.  Engaged in functional ADL tasks at EOB, max simple cueing with increased time with repetition within R visual field to locate spoon/wash cloth, successfully reaching items but minimal success swallowing (spitting most food back out).  Transitioned to weightlifting laterally, reaching with R hand then to forearm on L side.  Fatigues easily and limited by abdominal pain today, at completion of session pt pulling at abdominal binder and RN notified.  Will follow acutely. DC plan remains appropriate.       Follow Up Recommendations  SNF    Equipment Recommendations  Other (comment) (defer to next venue of care)    Recommendations for Other Services      Precautions / Restrictions Precautions Precautions: Fall Precaution Comments: L hemiplegia, L inattention, pusher Restrictions Weight Bearing Restrictions: No       Mobility Bed Mobility Overal bed mobility: Needs Assistance Bed Mobility: Sit to Supine;Supine to Sit     Supine to sit: Max assist;+2 for physical assistance Sit to supine: Max assist;+2 for physical assistance   General bed mobility comments: patient requires HOH support to reach for rail, support for LBs and trunk to ascend    Transfers                 General transfer comment: deferred due to fatigue    Balance Overall  balance assessment: Needs assistance Sitting-balance support: No upper extremity supported;Feet supported Sitting balance-Leahy Scale: Poor Sitting balance - Comments: mod to max assist at EOB, pushes to L with R UE support; focused on static sitting balance, then lateral weightshifting with reaching R UE to rail and then on L forearm. Postural control: Posterior lean;Left lateral lean (due to pushing)                                 ADL either performed or assessed with clinical judgement   ADL Overall ADL's : Needs assistance/impaired Eating/Feeding: Maximal assistance;Sitting Eating/Feeding Details (indicate cue type and reason): pt able to hold spoon and bring to mouth with R UE after setup but requires total assist to scoop; max cueing to locate spoon in R visual field; noted pt not swallowing food during session Grooming: Wash/dry face;Wash/dry hands;Maximal assistance;Sitting Grooming Details (indicate cue type and reason): washing face with setup assist, max assist to wash hands given HOH support; face EOB, hands supine                             Functional mobility during ADLs: Maximal assistance;+2 for physical assistance General ADL Comments: attempted engagement in self feeding at EOB, grooming at EOB and supine; remains limited by cognition, L flaccidity/inattention, impaired balance     Vision   Additional Comments: continues to present with L inattention/neglect, poor tracking and only able to locate items in R visual  field (not near midline)   Perception     Praxis      Cognition Arousal/Alertness: Awake/alert Behavior During Therapy: Impulsive;Restless Overall Cognitive Status: Impaired/Different from baseline Area of Impairment: Attention;Memory;Following commands;Safety/judgement;Awareness;Problem solving;Orientation                 Orientation Level: Disoriented to;Place Current Attention Level: Focused (very easily  distracted) Memory: Decreased recall of precautions;Decreased short-term memory Following Commands: Follows one step commands with increased time (requires multiple cues at times; repitition required) Safety/Judgement: Decreased awareness of safety;Decreased awareness of deficits Awareness: Intellectual Problem Solving: Slow processing;Decreased initiation;Difficulty sequencing;Requires verbal cues;Requires tactile cues General Comments: patient impulsive and requires constant redirection, no attention to L enviornment but able to consistently find L arm during session.        Exercises General Exercises - Upper Extremity Shoulder Flexion: PROM;Left;5 reps;Supine Elbow Flexion: PROM;Left;5 reps;Supine Elbow Extension: PROM;Left;5 reps;Supine Wrist Flexion: PROM;Right;5 reps;Supine Wrist Extension: PROM;Left;5 reps;Supine Digit Composite Flexion: PROM;Left;5 reps;Supine Composite Extension: PROM;Right;5 reps;Supine General Exercises - Lower Extremity Ankle Circles/Pumps: PROM;Left;10 reps;Supine Long Arc Quad: AROM;Right;10 reps;Seated Heel Slides: 10 reps;PROM;Left;Supine Other Exercises Other Exercises: LLE WB'ing in supine with LLE in bridged position   Shoulder Instructions       General Comments worked on feeding self in sitting, needed hand over hand assist, as well as use of washcloth.    Pertinent Vitals/ Pain       Pain Assessment: 0-10 Pain Score: 10-Worst pain ever Pain Location: abdomen Pain Descriptors / Indicators: Sore Pain Intervention(s): Limited activity within patient's tolerance;Monitored during session;Repositioned;Patient requesting pain meds-RN notified  Home Living                                          Prior Functioning/Environment              Frequency  Min 2X/week        Progress Toward Goals  OT Goals(current goals can now be found in the care plan section)  Progress towards OT goals: Progressing toward  goals  Acute Rehab OT Goals Patient Stated Goal: none stated OT Goal Formulation: Patient unable to participate in goal setting  Plan Frequency remains appropriate;Discharge plan remains appropriate    Co-evaluation    PT/OT/SLP Co-Evaluation/Treatment: Yes Reason for Co-Treatment: Complexity of the patient's impairments (multi-system involvement);Necessary to address cognition/behavior during functional activity;For patient/therapist safety PT goals addressed during session: Mobility/safety with mobility;Balance OT goals addressed during session: ADL's and self-care      AM-PAC OT "6 Clicks" Daily Activity     Outcome Measure   Help from another person eating meals?: A Lot Help from another person taking care of personal grooming?: A Lot Help from another person toileting, which includes using toliet, bedpan, or urinal?: Total Help from another person bathing (including washing, rinsing, drying)?: Total Help from another person to put on and taking off regular upper body clothing?: A Lot Help from another person to put on and taking off regular lower body clothing?: Total 6 Click Score: 9    End of Session    OT Visit Diagnosis: Unsteadiness on feet (R26.81);Other abnormalities of gait and mobility (R26.89);Muscle weakness (generalized) (M62.81);Pain;Hemiplegia and hemiparesis;Low vision, both eyes (H54.2);Other symptoms and signs involving cognitive function Hemiplegia - Right/Left: Right Hemiplegia - caused by: Cerebral infarction   Activity Tolerance Patient tolerated treatment well   Patient Left in bed;with call bell/phone within reach;with  bed alarm set   Nurse Communication Mobility status;Precautions;Other (comment) (pulling at abdominal binder)        Time: 9532-0233 OT Time Calculation (min): 29 min  Charges: OT General Charges $OT Visit: 1 Visit OT Treatments $Self Care/Home Management : 8-22 mins  Barry Brunner, OT Acute Rehabilitation Services Pager  [phone] Office [phone]    Chancy Milroy 04/22/2020, 3:30 PM

## 2020-04-22 NOTE — Anesthesia Postprocedure Evaluation (Signed)
Anesthesia Post Note  Patient: Gregory Osborn  Procedure(s) Performed: ESOPHAGOGASTRODUODENOSCOPY (EGD) WITH PROPOFOL (N/A ) PERCUTANEOUS ENDOSCOPIC GASTROSTOMY (PEG) PLACEMENT (N/A )     Patient location during evaluation: Endoscopy Anesthesia Type: MAC Level of consciousness: awake and alert Pain management: pain level controlled Vital Signs Assessment: post-procedure vital signs reviewed and stable Respiratory status: spontaneous breathing, nonlabored ventilation, respiratory function stable and patient connected to nasal cannula oxygen Cardiovascular status: stable and blood pressure returned to baseline Postop Assessment: no apparent nausea or vomiting Anesthetic complications: no   No complications documented.  Last Vitals:  Vitals:   04/22/20 1136 04/22/20 1158  BP: 127/64 122/74  Pulse: 83 76  Resp: 12 16  Temp:  36.7 C  SpO2: 100% 100%    Last Pain:  Vitals:   04/22/20 1158  TempSrc: Axillary  PainSc:                  March Rummage Evann Koelzer

## 2020-04-22 NOTE — Progress Notes (Signed)
Day of Surgery   Subjective/Chief Complaint: In Endo  NSE overnight.   Objective: Vital signs in last 24 hours: Temp:  [97.5 F (36.4 C)-98.6 F (37 C)] 97.5 F (36.4 C) (03/08 1008) Pulse Rate:  [77-87] 78 (03/08 0830) Resp:  [14-18] 14 (03/08 1008) BP: (105-142)/(48-85) 142/48 (03/08 1008) SpO2:  [96 %-99 %] 99 % (03/08 1008) Last BM Date: 04/20/20  Intake/Output from previous day: 03/07 0701 - 03/08 0700 In: 523.8 [NG/GT:523.8] Out: 1575 [Urine:1575] Intake/Output this shift: No intake/output data recorded.  General appearance: cooperative Nose: cortrak Resp: clear to auscultation bilaterally Cardio: regular rate and rhythm GI: soft, non-tender; bowel sounds normal; no masses,  no organomegaly Neurologic: Motor: L hemiparesis with neglect  Lab Results:  Recent Labs    04/21/20 0345 04/22/20 0340  WBC 8.3 9.4  HGB 13.4 13.7  HCT 40.1 41.0  PLT 494* 517*   BMET Recent Labs    04/21/20 0345 04/22/20 0340  NA 131* 132*  K 4.2 4.0  CL 97* 97*  CO2 22 24  GLUCOSE 152* 90  BUN 24* 26*  CREATININE 0.78 0.81  CALCIUM 9.2 9.9   PT/INR No results for input(s): LABPROT, INR in the last 72 hours. ABG No results for input(s): PHART, HCO3 in the last 72 hours.  Invalid input(s): PCO2, PO2  Studies/Results: No results found.  Anti-infectives: Anti-infectives (From admission, onward)   None      Assessment/Plan: R MCA infarct with L hemiplegia and neglect - for PEG this AM. Procedure, risks, and benefits D/W family again. They agree.  LOS: 20 days    Liz Malady 04/22/2020

## 2020-04-22 NOTE — Progress Notes (Signed)
Physical Therapy Treatment Patient Details Name: Gregory Osborn MRN: [id number] DOB: August 20, 1945 Today's Date: 04/22/2020    History of Present Illness 75 yo male admmitted to [place] ED on 2/16 with L hemiplegia and facial drop, imaging reveals R M1 occlusion with attempted thrombectomy (unsuccessful). Small R SAH, likely due to hemorrhagic conversion. ETT 2/16-present. PMH includes HTN, urinary retention with frequent UTIs, BPH, hematuria.    PT Comments    Pt seen with OT to maximize function, Gregory Osborn present for interpretation. Pt required hand over hand assist for R hand to L bed rail and max A +2 to come to sitting EOB. Pt reflexively pulls RLE back into bed at times but when cued could not bring RLE into bed and required max A +2 to return to supine as well. Session focused on sitting balance and using RUE in meaningful way, attempting to assist pt with self feeding. Pt able to bring spoon to mouth at times but minimally successful at swallowing food and spit most back out. WB'ing LUE performed in sitting with abdominal activation to return to upright sitting. PT will continue to follow.    Follow Up Recommendations  SNF     Equipment Recommendations  Wheelchair (measurements PT);Wheelchair cushion (measurements PT);Hospital bed (mechanical lift)    Recommendations for Other Services       Precautions / Restrictions Precautions Precautions: Fall Precaution Comments: L hemiplegia, L inattention, pusher Restrictions Weight Bearing Restrictions: No    Mobility  Bed Mobility Overal bed mobility: Needs Assistance Bed Mobility: Sit to Supine;Supine to Sit     Supine to sit: Max assist;+2 for physical assistance Sit to supine: Max assist;+2 for physical assistance   General bed mobility comments: pt able to bring RLE in and out of bed but seems to do so reflexively. When asked to bring RLE back into bed, pt reports he could not lift it into bed. Hand over hand assist and vc's for  pt to grasp L rail with R hand for sup to sit    Transfers                 General transfer comment: pt fatigues after working on sitting balance and requested to lie back down  Ambulation/Gait             General Gait Details: unable   Stairs             Wheelchair Mobility    Modified Rankin (Stroke Patients Only) Modified Rankin (Stroke Patients Only) Pre-Morbid Rankin Score: No symptoms Modified Rankin: Severe disability     Balance Overall balance assessment: Needs assistance Sitting-balance support: No upper extremity supported;Feet supported Sitting balance-Leahy Scale: Poor Sitting balance - Comments: mod-maxA, pushes to left with RUE support, otherwise requires modA due to posterior lean. Worked on [employer] through LUE in sitting and core activation to R self from L lean. Pt unable to attend to objects out of R visual field and even then has trouble converging on object Postural control: Posterior lean;Left lateral lean (due to pushing)                                  Cognition Arousal/Alertness: Awake/alert Behavior During Therapy: Impulsive;Restless Overall Cognitive Status: Impaired/Different from baseline Area of Impairment: Attention;Memory;Following commands;Safety/judgement;Awareness;Problem solving;Orientation                 Orientation Level: Disoriented to;Place Current Attention Level: Focused (very easily distracted)  Memory: Decreased recall of precautions;Decreased short-term memory Following Commands: Follows one step commands with increased time (requires multiple cues at times) Safety/Judgement: Decreased awareness of safety;Decreased awareness of deficits Awareness: Intellectual Problem Solving: Slow processing;Decreased initiation;Difficulty sequencing;Requires verbal cues;Requires tactile cues General Comments: pt very impulsive and moves quicky with strong R side. Pushes with RUE but also with RLE       Exercises General Exercises - Lower Extremity Ankle Circles/Pumps: PROM;Left;10 reps;Supine Long Arc Quad: AROM;Right;10 reps;Seated Heel Slides: 10 reps;PROM;Left;Supine Other Exercises Other Exercises: LLE WB'ing in supine with LLE in bridged position    General Comments General comments (skin integrity, edema, etc.): worked on feeding self in sitting, needed hand over hand assist, as well as use of washcloth.      Pertinent Vitals/Pain Pain Assessment: 0-10 Pain Score: 10-Worst pain ever Pain Location: abdomen Pain Descriptors / Indicators: Sore Pain Intervention(s): Limited activity within patient's tolerance;Monitored during session;Patient requesting pain meds-RN notified    Home Living                      Prior Function            PT Goals (current goals can now be found in the care plan section) Acute Rehab PT Goals Patient Stated Goal: none stated PT Goal Formulation: Patient unable to participate in goal setting Time For Goal Achievement: 05/02/20 Potential to Achieve Goals: Fair Progress towards PT goals: Progressing toward goals    Frequency    Min 3X/week      PT Plan Current plan remains appropriate    Co-evaluation PT/OT/SLP Co-Evaluation/Treatment: Yes Reason for Co-Treatment: Complexity of the patient's impairments (multi-system involvement);Necessary to address cognition/behavior during functional activity;For patient/therapist safety PT goals addressed during session: Mobility/safety with mobility;Balance        AM-PAC PT "6 Clicks" Mobility   Outcome Measure  Help needed turning from your back to your side while in a flat bed without using bedrails?: A Lot Help needed moving from lying on your back to sitting on the side of a flat bed without using bedrails?: A Lot Help needed moving to and from a bed to a chair (including a wheelchair)?: Total Help needed standing up from a chair using your arms (e.g., wheelchair or bedside  chair)?: A Lot Help needed to walk in hospital room?: Total Help needed climbing 3-5 steps with a railing? : Total 6 Click Score: 9    End of Session   Activity Tolerance: Patient tolerated treatment well Patient left: in bed;with call bell/phone within reach;with bed alarm set Nurse Communication: Mobility status;Patient requests pain meds;Other (comment) (pt pulling at abdominal binder) PT Visit Diagnosis: Other abnormalities of gait and mobility (R26.89);Hemiplegia and hemiparesis Hemiplegia - Right/Left: Left Hemiplegia - dominant/non-dominant: Dominant Hemiplegia - caused by: Cerebral infarction     Time: 9518-8416 PT Time Calculation (min) (ACUTE ONLY): 26 min  Charges:  $Therapeutic Activity: 8-22 mins                     Lyanne Co, PT  Acute Rehab Services  Pager [phone] Office [phone]    Gregory Osborn 04/22/2020, 2:18 PM

## 2020-04-22 NOTE — Progress Notes (Signed)
SLP Cancellation Note  Patient Details Name: Gregory Osborn MRN: [id number] DOB: [birth year]   Cancelled treatment:       Reason Eval/Treat Not Completed: Patient at procedure or test/unavailable. Pt for PEG tube placement today   [name], [name] 04/22/2020, 8:09 AM

## 2020-04-22 NOTE — Op Note (Signed)
[facility] Patient Name: Gregory Osborn Procedure Date : 04/22/2020 MRN: [id number] Attending MD: Georganna Skeans , MD Date of Birth: 1945-05-04 CSN: [id number] Age: 75 Admit Type: Inpatient Procedure:                Upper GI endoscopy Indications:              Place PEG because patient is unable to eat due to                            stroke (CVA) Providers:                Georganna Skeans, MD, Margie Billet, PA-C, Jobe Igo, RN, Elspeth Cho Tech., Technician, Referring MD:              Medicines:                Propofol per Anesthesia Complications:            No immediate complications. Estimated Blood Loss:     Estimated blood loss: none. Procedure:                Pre-Anesthesia Assessment:                           - Prior to the procedure, a History and Physical                            was performed, and patient medications and                            allergies were reviewed. The patient is unable to                            give consent secondary to the patient's altered                            mental status. The risks and benefits of the                            procedure and the sedation options and risks were                            discussed with the patient's spouse. All questions                            were answered and informed consent was obtained.                            Patient identification and proposed procedure were                            verified by the physician, the nurse, the  anesthetist and the technician in the endoscopy                            suite. Mental Status Examination: alert but                            confused. ASA Grade Assessment: III - A patient                            with severe systemic disease. After reviewing the                            risks and benefits, the patient was deemed in                            satisfactory condition  to undergo the procedure.                            The anesthesia plan was to use monitored anesthesia                            care (MAC). Immediately prior to administration of                            medications, the patient was re-assessed for                            adequacy to receive sedatives. The heart rate,                            respiratory rate, oxygen saturations, blood                            pressure, adequacy of pulmonary ventilation, and                            response to care were monitored throughout the                            procedure. The physical status of the patient was                            re-assessed after the procedure.                           After obtaining informed consent, the endoscope was                            passed under direct vision. Throughout the                            procedure, the patient's blood pressure, pulse, and  oxygen saturations were monitored continuously. The                            GIF-H190 ([id number] Olympus gastroscope was                            introduced through the mouth, and advanced to the                            duodenal bulb. The upper GI endoscopy was                            accomplished without difficulty. The patient                            tolerated the procedure well. Scope In: Scope Out: Findings:      No gross lesions were noted in the esophagus.      No gross lesions were noted in the stomach. Estimated blood loss: none.       Placement of an externally removable PEG with no T-fasteners was       successfully completed. The external bumper was at the 3.0 cm marking on       the tube.      No gross lesions were noted in the duodenal bulb. Impression:               - No gross lesions in esophagus.                           - No gross lesions in the stomach.                           - No gross lesions in the duodenal bulb.                            - An externally removable PEG placement was                            successfully completed.                           - No specimens collected. Recommendation:           meds OK now, resume tube feeds in 4h Procedure Code(s):        --- Professional ---                           [phone], Esophagogastroduodenoscopy, flexible,                            transoral; with directed placement of percutaneous                            gastrostomy tube Diagnosis Code(s):        --- Professional ---  Y64.158, Other sequelae of cerebral infarction                           R63.3, Feeding difficulties                           Z43.1, Encounter for attention to gastrostomy CPT copyright 2019 [employer]. All rights reserved. The codes documented in this report are preliminary and upon coder review may  be revised to meet current compliance requirements. Georganna Skeans, MD 04/22/2020 11:37:25 AM This report has been signed electronically. Number of Addenda: 0

## 2020-04-22 NOTE — Progress Notes (Signed)
Pt returned from surgery, vitals taken, PEG tube dressing clean and intact, at 3 cm. Patient resting comfortably, all needs addressed.

## 2020-04-22 NOTE — Plan of Care (Signed)
  Problem: Education: Goal: Knowledge of General Education information will improve Description: Including pain rating scale, medication(s)/side effects and non-pharmacologic comfort measures Outcome: Progressing   Problem: Health Behavior/Discharge Planning: Goal: Ability to manage health-related needs will improve Outcome: Progressing   Problem: Clinical Measurements: Goal: Ability to maintain clinical measurements within normal limits will improve Outcome: Progressing Goal: Will remain free from infection Outcome: Progressing Goal: Diagnostic test results will improve Outcome: Progressing Goal: Respiratory complications will improve Outcome: Progressing Goal: Cardiovascular complication will be avoided Outcome: Progressing   Problem: Activity: Goal: Risk for activity intolerance will decrease Outcome: Progressing   Problem: Nutrition: Goal: Adequate nutrition will be maintained Outcome: Progressing   Problem: Coping: Goal: Level of anxiety will decrease Outcome: Progressing   Problem: Elimination: Goal: Will not experience complications related to bowel motility Outcome: Progressing Goal: Will not experience complications related to urinary retention Outcome: Progressing   Problem: Pain Managment: Goal: General experience of comfort will improve Outcome: Progressing   Problem: Safety: Goal: Ability to remain free from injury will improve Outcome: Progressing   Problem: Skin Integrity: Goal: Risk for impaired skin integrity will decrease Outcome: Progressing   Problem: Education: Goal: Knowledge of disease or condition will improve Outcome: Progressing Goal: Knowledge of secondary prevention will improve Outcome: Progressing Goal: Knowledge of patient specific risk factors addressed and post discharge goals established will improve Outcome: Progressing Goal: Individualized Educational Video(s) Outcome: Progressing   Problem: Coping: Goal: Will verbalize  positive feelings about self Outcome: Progressing Goal: Will identify appropriate support needs Outcome: Progressing   Problem: Health Behavior/Discharge Planning: Goal: Ability to manage health-related needs will improve Outcome: Progressing   Problem: Self-Care: Goal: Ability to participate in self-care as condition permits will improve Outcome: Progressing Goal: Verbalization of feelings and concerns over difficulty with self-care will improve Outcome: Progressing Goal: Ability to communicate needs accurately will improve Outcome: Progressing   Problem: Nutrition: Goal: Risk of aspiration will decrease Outcome: Progressing Goal: Dietary intake will improve Outcome: Progressing   Problem: Ischemic Stroke/TIA Tissue Perfusion: Goal: Complications of ischemic stroke/TIA will be minimized Outcome: Progressing   Problem: Safety: Goal: Non-violent Restraint(s) Outcome: Progressing

## 2020-04-22 NOTE — Transfer of Care (Signed)
Immediate Anesthesia Transfer of Care Note  Patient: Gregory Osborn  Procedure(s) Performed: ESOPHAGOGASTRODUODENOSCOPY (EGD) WITH PROPOFOL (N/A ) PERCUTANEOUS ENDOSCOPIC GASTROSTOMY (PEG) PLACEMENT (N/A )  Patient Location: PACU and Endoscopy Unit  Anesthesia Type:MAC  Level of Consciousness: awake and confused  Airway & Oxygen Therapy: Patient Spontanous Breathing and Patient connected to nasal cannula oxygen  Post-op Assessment: Report given to RN and Post -op Vital signs reviewed and stable  Post vital signs: Reviewed and stable  Last Vitals:  Vitals Value Taken Time  BP 133/76 04/22/20 1128  Temp 36.8 C 04/22/20 1128  Pulse 81 04/22/20 1130  Resp 17 04/22/20 1130  SpO2 99 % 04/22/20 1130  Vitals shown include unvalidated device data.  Last Pain:  Vitals:   04/22/20 1128  TempSrc: Oral  PainSc: 0-No pain      Patients Stated Pain Goal: 0 (43/32/95 1884)  Complications: No complications documented.

## 2020-04-22 NOTE — Anesthesia Procedure Notes (Signed)
Procedure Name: MAC Date/Time: 04/22/2020 11:03 AM Performed by: Janene Harvey, CRNA Pre-anesthesia Checklist: Patient identified, Emergency Drugs available, Suction available and Patient being monitored Oxygen Delivery Method: Nasal cannula Induction Type: IV induction Placement Confirmation: positive ETCO2 Dental Injury: Teeth and Oropharynx as per pre-operative assessment

## 2020-04-23 ENCOUNTER — Encounter (HOSPITAL_COMMUNITY): Payer: Self-pay | Admitting: General Surgery

## 2020-04-23 DIAGNOSIS — I63511 Cerebral infarction due to unspecified occlusion or stenosis of right middle cerebral artery: Secondary | ICD-10-CM | POA: Diagnosis not present

## 2020-04-23 LAB — GLUCOSE, CAPILLARY
Glucose-Capillary: 122 mg/dL — ABNORMAL HIGH (ref 70–99)
Glucose-Capillary: 125 mg/dL — ABNORMAL HIGH (ref 70–99)
Glucose-Capillary: 127 mg/dL — ABNORMAL HIGH (ref 70–99)
Glucose-Capillary: 137 mg/dL — ABNORMAL HIGH (ref 70–99)
Glucose-Capillary: 139 mg/dL — ABNORMAL HIGH (ref 70–99)
Glucose-Capillary: 139 mg/dL — ABNORMAL HIGH (ref 70–99)
Glucose-Capillary: 157 mg/dL — ABNORMAL HIGH (ref 70–99)

## 2020-04-23 LAB — BASIC METABOLIC PANEL
Anion gap: 9 (ref 5–15)
BUN: 26 mg/dL — ABNORMAL HIGH (ref 8–23)
CO2: 24 mmol/L (ref 22–32)
Calcium: 9.2 mg/dL (ref 8.9–10.3)
Chloride: 99 mmol/L (ref 98–111)
Creatinine, Ser: 0.8 mg/dL (ref 0.61–1.24)
GFR, Estimated: >60 mL/min (ref >60)
Glucose, Bld: 140 mg/dL — ABNORMAL HIGH (ref 70–99)
Potassium: 4.2 mmol/L (ref 3.5–5.1)
Sodium: 132 mmol/L — ABNORMAL LOW (ref 135–145)

## 2020-04-23 LAB — CBC
HCT: 39.9 % (ref 39.0–52.0)
Hemoglobin: 13.6 g/dL (ref 13.0–17.0)
MCH: 28.9 pg (ref 26.0–34.0)
MCHC: 34.1 g/dL (ref 30.0–36.0)
MCV: 84.9 fL (ref 80.0–100.0)
Platelets: 492 10*3/uL — ABNORMAL HIGH (ref 150–400)
RBC: 4.7 MIL/uL (ref 4.22–5.81)
RDW: 11.9 % (ref 11.5–15.5)
WBC: 11.2 10*3/uL — ABNORMAL HIGH (ref 4.0–10.5)
nRBC: 0 % (ref 0.0–0.2)

## 2020-04-23 MED ORDER — MUPIROCIN 2 % EX OINT
1.0000 "application " | TOPICAL_OINTMENT | Freq: Two times a day (BID) | CUTANEOUS | Status: DC
  Administered 2020-04-23 – 2020-04-25 (×5): 1 via NASAL
  Filled 2020-04-23: qty 22

## 2020-04-23 MED ORDER — OSMOLITE 1.2 CAL PO LIQD
840.0000 mL | ORAL | Status: DC
  Administered 2020-04-23 – 2020-04-24 (×2): 840 mL
  Filled 2020-04-23: qty 948

## 2020-04-23 NOTE — Progress Notes (Signed)
Nutrition Follow-up  DOCUMENTATION CODES:   Not applicable  INTERVENTION:   Continue Ensure Enlive po BID, each supplement provides 350 kcal and 20 grams of protein (thicken to appropriate consistency)   Continue Nocturnal TF via PEG:  Osmolite 1.2 @ 70 ml/hr x 12 hours (from 1800-0600)  45 ml Prosource TF daily  Tube feeding regimen provides 1048 kcal (58% of needs), 58 grams of protein, and 689 ml of H2O.   NUTRITION DIAGNOSIS:   Inadequate oral intake related to lethargy/confusion,dysphagia as evidenced by NPO status. -- Ongoing  GOAL:   Patient will meet greater than or equal to 90% of their needs -- Progressing   MONITOR:   PO intake,Supplement acceptance,Diet advancement,Skin,TF tolerance,Weight trends,I & O's,Labs  REASON FOR ASSESSMENT:   Consult Enteral/tube feeding initiation and management  ASSESSMENT:   75 year old male who presented on 2/16 with stroke with left hemiplegia. PMH of UTI, urinary retention, hematuria, and appendectomy.  RD received page from RN regarding TF orders. Pt was receiving nocturnal TF via Cortrak -- Osmolite 1.2 cal @ 43ml/hr x 12 hours with 54ml Prosource TF daily. Pt was assessed by RD on 04/21/20 and recommended this nocturnal TF regimen be continued via Cortrak and then via PEG once ready for use. Today, RN reports that pt had been switched over to continuous TF yesterday, but was unsure why it was switched or who ordered the change. This change caused the pt's po intake to decrease significantly and pt was requesting appetite stimulants today. Upon further investigation, it appears order was modified by a provider (reason unclear) and now reads "Continuous @ 70 mL/hr over 12 Hours;" however, order dose still corresponded with amount provided by nocturnal TF regimen detailed above.  Will re-order nocturnal TF to clarify instructions. Discussed plan with RN. RN to turn off TF at this time; will be re-started tonight.   Note pt had EGD  and PEG placement on 3/8. Cortrak removed today. TF to be given via PEG.   UOP: 1L x24 hours  Medications: Ensure BID, Miralax, Senokot-S Labs: Na 132 (L) CBGs [phone]  Diet Order:   Diet Order            DIET - DYS 1 Room service appropriate? Yes; Fluid consistency: Nectar Thick  Diet effective now                 EDUCATION NEEDS:   No education needs have been identified at this time  Skin:  Skin Assessment: Reviewed RN Assessment Skin Integrity Issues:: Incisions Incisions: right groin  Last BM:  04/21/20  Height:   Ht Readings from Last 1 Encounters:  04/02/20 5\' 5"  (1.651 m)    Weight:   Wt Readings from Last 1 Encounters:  04/02/20 76.3 kg   BMI:  Body mass index is 27.99 kg/m.  Estimated Nutritional Needs:   Kcal:  1800-2000  Protein:  85-100 grams  Fluid:  1.8-2.0 L  04/04/20, MS, RD, LDN RD pager number and weekend/on-call pager number located in Amion.

## 2020-04-23 NOTE — Progress Notes (Signed)
  Speech Language Pathology Treatment: Cognitive-Linquistic  Patient Details Name: Collie Wernick MRN: [id number] DOB: May 16, 1945 Today's Date: 04/23/2020 Time: 3419-3790 SLP Time Calculation (min) (ACUTE ONLY): 18 min  Assessment / Plan / Recommendation Clinical Impression  Session focused on cognition, targeting awareness of right visal neglect and working memory in a basic functional task. Pt able to achieve eye contact on left with intermittent cueing, locate a box on his left filled with 4 objects, remove them, name them, find them and replace them with frequent cues for one step commands. Highly distracted by anything in right visual field. Pt sustained eye contact on left at end of session for 30 seconds and maintained topic during conversation about Izola Price. Making progress. Will continue efforts.   HPI HPI: Izsak Meir is a 75 y.o. male with a history of UTI, urinary retention, hematuria, and appendectomy. He was seen by his urologist yesterday, and was doing well. Plans were made to undergo ultrasound, and to treat for UTI. He has been doing well up until 11:25am when he was noted to develop left hemiplegia. EMS was contacted, and took him to the hospital ED where stroke workup was undertaken. He was noted to have left hemiplegia and neglect. He was not given tPA due to the hematuria. Transferred urgently for thrombectomy. 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 2/19 presented with clinical decline, somnolence, not following commands. CT head showed increase midline shift to 12 mm and edema.  2/21 with some improved mentation.      SLP Plan          Recommendations                   Oral Care Recommendations: Oral care BID Follow up Recommendations: Inpatient Rehab       GO                DeBlois, [name] 04/23/2020, 2:22 PM

## 2020-04-23 NOTE — Progress Notes (Addendum)
STROKE TEAM PROGRESS NOTE   INTERVAL HISTORY No acute events overnight.  He reports doing well. He states he has some mild pain in his stomach but it is no that bad. He reports no other concerns.  His abdominal binder is clean, dry, and intact. He will begin feeding today. Social Work will reach out to his family for a decision of SNF placement. Neuro exam is unchanged.  Surgery is signing off.  Vitals:   04/22/20 2011 04/23/20 0003 04/23/20 0336 04/23/20 0747  BP: 128/82 130/90 127/80 134/75  Pulse: 92 97 93 91  Resp: 16 18 18 18   Temp: (!) 97.5 F (36.4 C) 97.8 F (36.6 C) 98.5 F (36.9 C) 98.4 F (36.9 C)  TempSrc: Oral   Oral  SpO2: 97% 100% 97% 98%  Weight:      Height:       CBC:  Recent Labs  Lab 04/22/20 0340 04/23/20 0441  WBC 9.4 11.2*  HGB 13.7 13.6  HCT 41.0 39.9  MCV 85.8 84.9  PLT 517* 492*   Basic Metabolic Panel:  Recent Labs  Lab 04/22/20 0340 04/23/20 0441  NA 132* 132*  K 4.0 4.2  CL 97* 99  CO2 24 24  GLUCOSE 90 140*  BUN 26* 26*  CREATININE 0.81 0.80  CALCIUM 9.9 9.2   Lipid Panel: No results for input(s): CHOL, TRIG, HDL, CHOLHDL, VLDL, LDLCALC in the last 168 hours. HgbA1c: No results for input(s): HGBA1C in the last 168 hours. Urine Drug Screen: No results for input(s): LABOPIA, COCAINSCRNUR, LABBENZ, AMPHETMU, THCU, LABBARB in the last 168 hours.  Alcohol Level No results for input(s): ETH in the last 168 hours.  IMAGING past 24 hours No results found.  PHYSICAL EXAM Blood pressure 134/75, pulse 91, temperature 98.4 F (36.9 C), temperature source Oral, resp. rate 18, height 5\' 5"  (1.651 m), weight 76.3 kg, SpO2 98 %.  General: alert and awake, elderly Hispanic male, no apparent distress  Lungs: Symmetrical Chest rise, no labored breathing  Cardio: Regular Rate and Rhythm  Abdomen: Soft, non-tender, Abdominal Binder, clean dry, and in place  Neuro: Alert and awake. Answers questions appropriately, mild dysarthria present,  fluent speech, Right Gaze unable to cross midline Left facial droop Strength: Left Upper and Lower extremities-unable to lift, withdraws to noxious stimuli Right Upper and Lower Extremities: 4/5   ASSESSMENT/PLAN Gregory Osborn is a 75 y.o. male with history of recent urologic issues including UTI (01/22), urinary retention with foley cath placed, hematuria (likely traumatic due to self cath) s/p cystoscopy 04/01/20 by Dr. [facility]12-[facility] showing BPH with recommended surgical treatment. Surgical history includes appendectomy.He had been doing well up until 11:25am when he was noted to develop left hemiplegia. EMS was contacted, and took him to the [facility] ED where stroke workup was undertaken. He was noted to have left hemiplegia and neglect. NIHSS score 15. He was not given tPA due to the hematuria. Transferred urgently to [facility] for thrombectomy which was aborted after multiple attempts which were unsuccessful.   He tolerated the PEG tube placement well yesterday. He will begin receiving feeding through it today. He reports only mild pain in his abdomen which is expected post procedure. His family is choosing a SNF and had not called Social Work yesterday, Social Work will reach out to them today. Hopefully will be able to discharge to SNF tomorrow. Neuro exam is unchanged from yesterday.  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   MRI showed large right MCA infarct involving entire right MCA, consistent with malignant right MCA syndrome. Small right SAH likely due to hemorrhagic conversion.   MRA head showed right M1 occluded.   CT repeat 2/23 right MCA large infarct with MLS 92mm  2D EchoEF 60-65%, No shunt  May consider 30 day cardiac event monitoring as  outpt to rule out afib  LDL118  HgbA1c5.6  VTE prophylaxis - heparin subq  No antithrombotics PTA, now on ASA 325mg   Therapy recommendations: SNF  Disposition: P  Cerebral Edema  Repeat HCT 2/19 shows worsening shift and edema, MLS at 69mm  CT repeat 2/23 right MCA large infarct with MLS 53mm  On 3% saline -> NS -> off  Neurosurgery, Dr. 13m, consulted and advised patient is not a candidate for surgical intervention.    Clinically seems to have resolved cerebral edema  Hypertensive urgency  Home meds: Lisinopril 5mg  daily  BP on the low end  On amlodipine 10 -> d/c  Off lisinopril due to AKI  BP goal < 160 given hemorrhagic conversion  Long-term BP goal normotensive  HLD   No statin prior to admission  LDL 118, goal < 70  On lipitor 40mg  daily  Continue statin on discharge  Dysphagia   NPO after neurological decline  Swallow evaluation 3/3, passed for dysphagia nectar thick liquid diet  Will have PEG tube placed today.  On nocturnal TF, Nutrition team following  Encourage po intake  Hyponatremia, improving  Na 131->130->132  MIVF NS at 20cc/hr and monitor po intake  FW discontinued  On TF nocturnal   Encourage po intake  Strict intake and output   Trend Na  Intractable hiccups, improved  On thorazine 12.5mg  tid  Addedbaclofen 5mg  tid  Continue to monitor  Urinary retention d/t BPH present on admission  Creatinine 0.93->2.08->0.74->.72->0.78->0.85>0.81  Developed urinary retention and UTI about a month ago necessitating trial of intermittent self cath (failed) and then subsequent foley placement  S/p cystoscopy 2/15 with recommended surgical [facility]PH [facility]  Hematuria appears transient after attempts at self cath per urology notes review. Hematuria not present on UA at admission.  Continue foley catheter, Monitor for hematuria. No signs of active bleeding. hgb stable. Foley  output remains clear yellow.   Tiny R ankle fracture  Complaining of right ankle pain   Punctate bony density noted adjacent to the distal tip of the lateral malleolus on xray   Ortho PA on call recommends Cam boot, WBAT and follow up with 3/15, PA in 2 weeks which was requested. No formal consult required per ortho PA.   Other Stroke Risk Factors  ETOH use, alcohol level <10, family reports he drinks regularly   Other Active Problems  Hypokalemia -3.4 ->3.5->3.8->4.7->4.4>4.2  Code status - DNR   Hospital day # 21   To contact Stroke Continuity provider, please refer to Pete Glatter. After hours, contact General Neurology   Attending Neurohospitalist Addendum Patient seen and examined with APP/Resident. Agree with the history and physical as documented above. Agree with the plan as documented, which I helped formulate. I have independently reviewed the chart, obtained history, review of systems and examined the patient.I have personally reviewed pertinent head/neck/spine imaging (CT/MRI). Please feel free to call with any questions.  -- [facility], MD Stroke  Neurology Pager: [phone]

## 2020-04-23 NOTE — Progress Notes (Signed)
Physical Therapy Treatment Patient Details Name: Gregory Osborn MRN: [id number] DOB: 07-22-1945 Today's Date: 04/23/2020    History of Present Illness 75 yo male admmitted to [place] ED on 2/16 with L hemiplegia and facial drop, imaging reveals R M1 occlusion with attempted thrombectomy (unsuccessful). Small R SAH, likely due to hemorrhagic conversion. PMH includes HTN, urinary retention with frequent UTIs, BPH, hematuria.    PT Comments    Pt progressing towards goals. Graciela assisted with interpretation for pt. Worked on static and dynamic sitting tasks this session. Continuous cues for placement of RUE to avoid pushing posture. Max A +2 for bed mobility tasks. Current recommendations appropriate. Will continue to follow acutely.   Follow Up Recommendations  SNF     Equipment Recommendations  Wheelchair (measurements PT);Wheelchair cushion (measurements PT);Hospital bed;Other (comment) (mechanical lift)    Recommendations for Other Services       Precautions / Restrictions Precautions Precautions: Fall Precaution Comments: L hemiplegia, L inattention, pusher Restrictions Weight Bearing Restrictions: No    Mobility  Bed Mobility Overal bed mobility: Needs Assistance Bed Mobility: Sit to Supine;Supine to Sit     Supine to sit: Max assist;+2 for physical assistance Sit to supine: Max assist;+2 for physical assistance   General bed mobility comments: patient requires HOH support to reach for rail. Required assist for trunk and LE. Pt pusing to L in sitting. HOH assist to move RUE to knee.    Transfers                 General transfer comment: deferred due to fatigue  Ambulation/Gait                 Stairs             Wheelchair Mobility    Modified Rankin (Stroke Patients Only)       Balance Overall balance assessment: Needs assistance Sitting-balance support: No upper extremity supported;Feet supported Sitting balance-Leahy Scale:  Poor Sitting balance - Comments: mod to max assist at EOB, pushes to L with R UE support; focused on static sitting balance Postural control: Posterior lean;Left lateral lean (due to pushing)                                  Cognition Arousal/Alertness: Awake/alert Behavior During Therapy: Impulsive;Restless Overall Cognitive Status: Impaired/Different from baseline Area of Impairment: Attention;Memory;Following commands;Safety/judgement;Awareness;Problem solving                   Current Attention Level: Focused (very easily distracted) Memory: Decreased recall of precautions;Decreased short-term memory Following Commands: Follows one step commands with increased time (requires multiple cues at times; repitition required) Safety/Judgement: Decreased awareness of safety;Decreased awareness of deficits Awareness: Intellectual Problem Solving: Slow processing;Decreased initiation;Difficulty sequencing;Requires verbal cues;Requires tactile cues General Comments: patient impulsive and requires constant redirection, no attention to L enviornment but able to consistently find L arm during session.      Exercises Other Exercises Other Exercises: Worked on dynamic reaching task using RUE X10 Other Exercises: Worked on lateral propping to R and L X10.    General Comments        Pertinent Vitals/Pain Pain Assessment: Faces Faces Pain Scale: Hurts little more Pain Location: abdomen Pain Descriptors / Indicators: Sore Pain Intervention(s): Limited activity within patient's tolerance;Monitored during session;Repositioned    Home Living  Prior Function            PT Goals (current goals can now be found in the care plan section) Acute Rehab PT Goals Patient Stated Goal: none stated PT Goal Formulation: Patient unable to participate in goal setting Time For Goal Achievement: 05/02/20 Potential to Achieve Goals: Fair Progress towards  PT goals: Progressing toward goals    Frequency    Min 3X/week      PT Plan Current plan remains appropriate    Co-evaluation              AM-PAC PT "6 Clicks" Mobility   Outcome Measure  Help needed turning from your back to your side while in a flat bed without using bedrails?: A Lot Help needed moving from lying on your back to sitting on the side of a flat bed without using bedrails?: A Lot Help needed moving to and from a bed to a chair (including a wheelchair)?: A Lot Help needed standing up from a chair using your arms (e.g., wheelchair or bedside chair)?: A Lot Help needed to walk in hospital room?: Total Help needed climbing 3-5 steps with a railing? : Total 6 Click Score: 10    End of Session   Activity Tolerance: Patient tolerated treatment well Patient left: in bed;with call bell/phone within reach;with bed alarm set Nurse Communication: Mobility status PT Visit Diagnosis: Other abnormalities of gait and mobility (R26.89);Hemiplegia and hemiparesis Hemiplegia - Right/Left: Left Hemiplegia - dominant/non-dominant: Dominant Hemiplegia - caused by: Cerebral infarction     Time: 4098-1191 PT Time Calculation (min) (ACUTE ONLY): 16 min  Charges:  $Neuromuscular Re-education: 8-22 mins                     Cindee Salt, DPT  Acute Rehabilitation Services  Pager: [phone] Office: [phone]    Gregory Osborn 04/23/2020, 4:09 PM

## 2020-04-23 NOTE — Progress Notes (Signed)
1 Day Post-Op   Subjective/Chief Complaint: Reports he is in the hospital because he had a stroke. No comp.   Objective: Vital signs in last 24 hours: Temp:  [97.5 F (36.4 C)-98.5 F (36.9 C)] 98.4 F (36.9 C) (03/09 0747) Pulse Rate:  [76-97] 91 (03/09 0747) Resp:  [12-27] 18 (03/09 0747) BP: (105-142)/(48-90) 134/75 (03/09 0747) SpO2:  [96 %-100 %] 98 % (03/09 0747) Last BM Date: 04/20/20  Intake/Output from previous day: 03/08 0701 - 03/09 0700 In: 200 [I.V.:200] Out: 1000 [Urine:1000] Intake/Output this shift: No intake/output data recorded.  GI: PEG in place, binder was on but open, soft, NT  Lab Results:  Recent Labs    04/22/20 0340 04/23/20 0441  WBC 9.4 11.2*  HGB 13.7 13.6  HCT 41.0 39.9  PLT 517* 492*   BMET Recent Labs    04/22/20 0340 04/23/20 0441  NA 132* 132*  K 4.0 4.2  CL 97* 99  CO2 24 24  GLUCOSE 90 140*  BUN 26* 26*  CREATININE 0.81 0.80  CALCIUM 9.9 9.2   PT/INR No results for input(s): LABPROT, INR in the last 72 hours. ABG No results for input(s): PHART, HCO3 in the last 72 hours.  Invalid input(s): PCO2, PO2  Studies/Results: No results found.  Anti-infectives: Anti-infectives (From admission, onward)   None      Assessment/Plan: S/P PEG 3/8 - doing well. Binder loosely at all times to protect PEG. I also instructed him to not mess with it. Please cvall Trauma PRN. I am happy to see him in the office once he does not need the PEG anymore to remove it.  LOS: 21 days    Liz Malady 04/23/2020

## 2020-04-24 DIAGNOSIS — I63511 Cerebral infarction due to unspecified occlusion or stenosis of right middle cerebral artery: Secondary | ICD-10-CM | POA: Diagnosis not present

## 2020-04-24 LAB — GLUCOSE, CAPILLARY
Glucose-Capillary: 162 mg/dL — ABNORMAL HIGH (ref 70–99)
Glucose-Capillary: 112 mg/dL — ABNORMAL HIGH (ref 70–99)
Glucose-Capillary: 106 mg/dL — ABNORMAL HIGH (ref 70–99)
Glucose-Capillary: 118 mg/dL — ABNORMAL HIGH (ref 70–99)
Glucose-Capillary: 109 mg/dL — ABNORMAL HIGH (ref 70–99)

## 2020-04-24 NOTE — Progress Notes (Addendum)
STROKE TEAM PROGRESS NOTE   INTERVAL HISTORY No acute events overnight.  He reports doing ok today. He reports only mild pain from his PEG tube that is improved from yesterday. He has no other concerns at present.  Neuro exam is unchanged from yesterday. Social Work will contact family for SNF placement. Hope to discharge tomorrow.   Vitals:   04/23/20 1935 04/23/20 2342 04/24/20 0333 04/24/20 0800  BP: 132/77 130/81 136/75 128/76  Pulse: 99 100 97 97  Resp: 18 18 18 18   Temp: 97.9 F (36.6 C) 98.3 F (36.8 C) 98.7 F (37.1 C) 98.2 F (36.8 C)  TempSrc: Oral Oral Oral Oral  SpO2: 98% 95% 98% 100%  Weight:      Height:       CBC:  Recent Labs  Lab 04/22/20 0340 04/23/20 0441  WBC 9.4 11.2*  HGB 13.7 13.6  HCT 41.0 39.9  MCV 85.8 84.9  PLT 517* 492*   Basic Metabolic Panel:  Recent Labs  Lab 04/22/20 0340 04/23/20 0441  NA 132* 132*  K 4.0 4.2  CL 97* 99  CO2 24 24  GLUCOSE 90 140*  BUN 26* 26*  CREATININE 0.81 0.80  CALCIUM 9.9 9.2   Lipid Panel: No results for input(s): CHOL, TRIG, HDL, CHOLHDL, VLDL, LDLCALC in the last 168 hours. HgbA1c: No results for input(s): HGBA1C in the last 168 hours. Urine Drug Screen: No results for input(s): LABOPIA, COCAINSCRNUR, LABBENZ, AMPHETMU, THCU, LABBARB in the last 168 hours.  Alcohol Level No results for input(s): ETH in the last 168 hours.  IMAGING past 24 hours No results found.  PHYSICAL EXAM Blood pressure 128/76, pulse 97, temperature 98.2 F (36.8 C), temperature source Oral, resp. rate 18, height 5\' 5"  (1.651 m), weight 76.3 kg, SpO2 100 %.  General: alert and awake, elderly Hispanic male, no apparent distress  Lungs: Symmetrical Chest rise, no labored breathing  Cardio: Regular Rate and Rhythm  Abdomen: Soft, non-tender, Abdominal Binder clean, dry, and in place.  Neuro:Alert and awake. Answers questions appropriately, mild dysarthria present, fluent speech, Right Gaze unable to cross midline Left  facial droop Strength: Left Upper and Lower extremities-unable to lift, withdraws to noxious stimuli Right Upper and Lower Extremities: 4/5    ASSESSMENT/PLAN [name] is a 75 y.o. male with history of recent urologic issues including UTI (01/22), urinary retention with foley cath placed, hematuria (likely traumatic due to self cath) s/p cystoscopy 04/01/20 [facility] Dr[facility] showing BPH with recommended surgical treatment. Surgical history includes appendectomy.He had been doing well up until 11:25am when he was noted to develop left hemiplegia. EMS was contacted, and took him to [facility] ED where stroke workup was undertaken. He was noted to have left hemiplegia and neglect. NIHSS score 15. He was not given tPA due to the hematuria. Transferred urgently to [facility] for thrombectomy which was aborted after multiple attempts which were unsuccessful.   He is still stable. Tolerating PEG tube feeds. Neuro exam unchanged. Social Work will contact family for decision on SNF placement. Hope for discharge to SNF tomorrow.   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   MRI showed large right MCA infarct involving entire right MCA, consistent with malignant right MCA syndrome. Small right SAH likely due to hemorrhagic  conversion.   MRA head showed right M1 occluded.   CT repeat 2/23 right MCA large infarct with MLS 83mm  2D EchoEF 60-65%, No shunt  May consider 30 day cardiac event monitoring as outpt to rule out afib  LDL118  HgbA1c5.6  VTE prophylaxis - heparin subq  No antithrombotics PTA, now on ASA 325mg   Therapy recommendations: SNF  Disposition:Pending SNF placement  Cerebral Edema  Repeat HCT 2/19 shows worsening shift and edema,  MLS at 36mm  CT repeat 2/23 right MCA large infarct with MLS 46mm  On 3% saline -> NS -> off  Neurosurgery, Dr. 13m, consulted and advised patient is not a candidate for surgical intervention.   Clinically seems to have resolved cerebral edema  Hypertensive urgency  Home meds: Lisinopril 5mg  daily  BP on the low end  On amlodipine 10 -> d/c  Off lisinopril due to AKI  BP goal < 160 given hemorrhagic conversion  Long-term BP goal normotensive  HLD   No statin prior to admission  LDL 118, goal < 70  On lipitor 40mg  daily  Continue statin on discharge  Dysphagia   Swallow evaluation 3/3, passed for dysphagia nectar thick liquid diet  PEG tube succesfully placed 3/8  Encourage po intake Reported during day of some discomfort and bleeding around PEG site.  Requested general surgery to revisit.  Dressing change.  Doing well.  Hyponatremia, improving  Na 131->130->132  MIVF NS at 20cc/hr and monitor po intake  FW discontinued   Encourage po intake  Strict intake and output   Trend Na  Intractable hiccups, improved  On thorazine 12.5mg  tid  Addedbaclofen 5mg  tid  Continue to monitor  Urinary retention d/t BPH present on admission  Creatinine 0.93->2.08->0.74->.72->0.78->0.85>0.81  Developed urinary retention and UTI about a month ago necessitating trial of intermittent self cath (failed) and then subsequent foley placement  S/p cystoscopy 2/15 with recommended surgical [facility] for BPH [facility])  Hematuria appears transient after attempts at self cath per urology notes review. Hematuria not present on UA at admission.  Continue foley catheter, Monitor for hematuria. No signs of active bleeding. hgb stable. Foley output remains clear yellow.   Tiny R ankle fracture  Complaining of right ankle pain   Punctate bony density noted adjacent to the distal tip of the lateral malleolus on xray   Ortho PA on call  recommends Cam boot, WBAT and follow up with 3/15, PA in 2 weeks which was requested. No formal consult required per ortho PA.   Other Stroke Risk Factors  ETOH use, alcohol level <10, family reports he drinks regularly   Other Active Problems  Hypokalemia (resolved)  Code status - DNR  Hospital day # 22  Pete Glatter MD Resident  Attending Neurohospitalist Addendum Patient seen and examined with APP/Resident. Agree with the history and physical as documented above. Agree with the plan as documented, which I helped formulate. I have independently reviewed the chart, obtained history, review of systems and examined the patient.I have personally reviewed pertinent head/neck/spine imaging (CT/MRI). Please feel free to call with any questions.  -- [facility], MD Stroke Neurology Pager: [phone]   To contact Stroke Continuity provider, please refer to Lucia Estelle. After hours, contact General Neurology

## 2020-04-24 NOTE — Progress Notes (Signed)
Pt PEG tube assessed, scant new bloody drainage, 2" x 2" split gauze saturated, MD Janee Morn notified, gave verbal order hold tube meds until surgical staff assess/treat. Will continue to monitor.

## 2020-04-24 NOTE — Plan of Care (Signed)
  Problem: Education: Goal: Knowledge of General Education information will improve Description: Including pain rating scale, medication(s)/side effects and non-pharmacologic comfort measures Outcome: Progressing   Problem: Health Behavior/Discharge Planning: Goal: Ability to manage health-related needs will improve Outcome: Progressing   Problem: Clinical Measurements: Goal: Ability to maintain clinical measurements within normal limits will improve Outcome: Progressing Goal: Will remain free from infection Outcome: Progressing Goal: Diagnostic test results will improve Outcome: Progressing Goal: Respiratory complications will improve Outcome: Progressing Goal: Cardiovascular complication will be avoided Outcome: Progressing   Problem: Activity: Goal: Risk for activity intolerance will decrease Outcome: Progressing   Problem: Nutrition: Goal: Adequate nutrition will be maintained Outcome: Progressing   Problem: Coping: Goal: Level of anxiety will decrease Outcome: Progressing   Problem: Elimination: Goal: Will not experience complications related to bowel motility Outcome: Progressing Goal: Will not experience complications related to urinary retention Outcome: Progressing   Problem: Pain Managment: Goal: General experience of comfort will improve Outcome: Progressing   Problem: Safety: Goal: Ability to remain free from injury will improve Outcome: Progressing   Problem: Skin Integrity: Goal: Risk for impaired skin integrity will decrease Outcome: Progressing   Problem: Education: Goal: Knowledge of disease or condition will improve Outcome: Progressing Goal: Knowledge of secondary prevention will improve Outcome: Progressing Goal: Knowledge of patient specific risk factors addressed and post discharge goals established will improve Outcome: Progressing Goal: Individualized Educational Video(s) Outcome: Progressing   Problem: Coping: Goal: Will verbalize  positive feelings about self Outcome: Progressing Goal: Will identify appropriate support needs Outcome: Progressing   Problem: Health Behavior/Discharge Planning: Goal: Ability to manage health-related needs will improve Outcome: Progressing   Problem: Self-Care: Goal: Ability to participate in self-care as condition permits will improve Outcome: Progressing Goal: Verbalization of feelings and concerns over difficulty with self-care will improve Outcome: Progressing Goal: Ability to communicate needs accurately will improve Outcome: Progressing   Problem: Nutrition: Goal: Risk of aspiration will decrease Outcome: Progressing Goal: Dietary intake will improve Outcome: Progressing   Problem: Ischemic Stroke/TIA Tissue Perfusion: Goal: Complications of ischemic stroke/TIA will be minimized Outcome: Progressing   Problem: Safety: Goal: Non-violent Restraint(s) Outcome: Progressing

## 2020-04-24 NOTE — TOC Progression Note (Signed)
Transition of Care [facility]) - Progression Note    Patient Details  Name: Benz Vandenberghe MRN: [id number] Date of Birth: [birth year]  Transition of [facility] CM/SW Contact  Baldemar Lenis, [place] Phone Number: 04/24/2020, 4:28 PM  Clinical Narrative:   CSW confirmed with family that they would like to choose bed offer at [facility], and [facility] initiated insurance authorization through [employer]. CSW to follow for discharge to SNF when authorization has been received.    Expected Discharge Plan: Skilled Nursing Facility Barriers to Discharge: Continued Medical Work up  Expected Discharge Plan and Services Expected Discharge Plan: Skilled Nursing Facility   Discharge Planning Services: CM Consult   Living arrangements for the past 2 months: Single Family Home                                       Social Determinants of Health (SDOH) Interventions    Readmission Risk Interventions No flowsheet data found.

## 2020-04-24 NOTE — Progress Notes (Signed)
Physical Therapy Treatment Patient Details Name: Gregory Osborn MRN: [id number] DOB: 1945/11/24 Today's Date: 04/24/2020    History of Present Illness 75 yo male admmitted to [place] ED on 2/16 with L hemiplegia and facial drop, imaging reveals R M1 occlusion with attempted thrombectomy (unsuccessful). Small R SAH, likely due to hemorrhagic conversion. PMH includes HTN, urinary retention with frequent UTIs, BPH, hematuria.    PT Comments    Patient continues to push heavily to L in sitting with inability to correct but states that he feels like he is falling to the L. Patient requires min-modA to maintain midline in sitting. Worked on lateral propping on R, however patient unable to maintain due to pushing to L. Patient continues to be limited L neglect, L hemiplegia, and L pusher syndrome. Continue to recommend SNF for ongoing Physical Therapy.       Follow Up Recommendations  SNF     Equipment Recommendations  Wheelchair (measurements PT);Wheelchair cushion (measurements PT);Hospital bed;Other (comment) (mechanical lift)    Recommendations for Other Services       Precautions / Restrictions Precautions Precautions: Fall Precaution Comments: L hemiplegia, L neglect, pusher. CAM boot in room, per granddaughter he injured R ankle prior to admission Restrictions Weight Bearing Restrictions: No    Mobility  Bed Mobility Overal bed mobility: Needs Assistance Bed Mobility: Supine to Sit;Sit to Supine     Supine to sit: Max assist;+2 for safety/equipment;+2 for physical assistance Sit to supine: Max assist;+2 for physical assistance;+2 for safety/equipment   General bed mobility comments: HOH for reach for rail on L side and maxA+2 to bring LEs off bed and trunk elevation. Instantaneously pushing to L side upon sitting. Physical assist to place R UE on knee. Continues to push heavily to L and unable to maintain midline without min-modA. Patient with constant head turning up/down/to R  with no evidence of attending to L side even with multimodal cueing.    Transfers                 General transfer comment: deferred due to heavy L lateral lean and inability to find midline in sitting  Ambulation/Gait                 Stairs             Wheelchair Mobility    Modified Rankin (Stroke Patients Only) Modified Rankin (Stroke Patients Only) Pre-Morbid Rankin Score: No symptoms Modified Rankin: Severe disability     Balance Overall balance assessment: Needs assistance Sitting-balance support: No upper extremity supported;Feet supported Sitting balance-Leahy Scale: Poor Sitting balance - Comments: min-modA at EOB to maintain midline with heavy pushing to L with R UE support. Frequent placement of R UE in lap. Patient able to state that he felt like he was falling to the L, but unable to correct without physical assist Postural control: Posterior lean;Left lateral lean (due to pushing)                                  Cognition Arousal/Alertness: Awake/alert Behavior During Therapy: Impulsive;Restless Overall Cognitive Status: Impaired/Different from baseline Area of Impairment: Attention;Memory;Following commands;Safety/judgement;Awareness;Problem solving                   Current Attention Level: Focused (very easily distracted) Memory: Decreased recall of precautions;Decreased short-term memory Following Commands: Follows one step commands with increased time (multiple cues at times; repetition required) Safety/Judgement: Decreased awareness  of safety;Decreased awareness of deficits Awareness: Intellectual Problem Solving: Slow processing;Decreased initiation;Difficulty sequencing;Requires verbal cues;Requires tactile cues General Comments: patient impulsive and requires redirection throughout. Constant multimodal cues provided to attend to L environment with patient unable to find L side during session      Exercises  Other Exercises Other Exercises: lateral propping on R elbow x 30 seconds, patient pushing to L and unable to maintain on R UE    General Comments General comments (skin integrity, edema, etc.): R gaze preference with L neglect      Pertinent Vitals/Pain Pain Assessment: Faces Faces Pain Scale: Hurts little more Pain Location: abdomen Pain Descriptors / Indicators: Grimacing Pain Intervention(s): Repositioned;Monitored during session    Home Living                      Prior Function            PT Goals (current goals can now be found in the care plan section) Acute Rehab PT Goals Patient Stated Goal: none stated PT Goal Formulation: Patient unable to participate in goal setting Time For Goal Achievement: 05/02/20 Potential to Achieve Goals: Fair Progress towards PT goals: Progressing toward goals    Frequency    Min 3X/week      PT Plan Current plan remains appropriate    Co-evaluation              AM-PAC PT "6 Clicks" Mobility   Outcome Measure  Help needed turning from your back to your side while in a flat bed without using bedrails?: A Lot Help needed moving from lying on your back to sitting on the side of a flat bed without using bedrails?: A Lot Help needed moving to and from a bed to a chair (including a wheelchair)?: A Lot Help needed standing up from a chair using your arms (e.g., wheelchair or bedside chair)?: Total Help needed to walk in hospital room?: Total Help needed climbing 3-5 steps with a railing? : Total 6 Click Score: 9    End of Session   Activity Tolerance: Patient tolerated treatment well Patient left: in bed;with call bell/phone within reach;with bed alarm set Nurse Communication: Mobility status PT Visit Diagnosis: Other abnormalities of gait and mobility (R26.89);Hemiplegia and hemiparesis Hemiplegia - Right/Left: Left Hemiplegia - dominant/non-dominant: Dominant Hemiplegia - caused by: Cerebral infarction      Time: 7628-3151 PT Time Calculation (min) (ACUTE ONLY): 28 min  Charges:  $Therapeutic Activity: 8-22 mins $Neuromuscular Re-education: 8-22 mins                     Altin Sease A. Dan Humphreys PT, DPT Acute Rehabilitation Services Pager [phone] Office [phone]    Viviann Spare 04/24/2020, 3:02 PM

## 2020-04-25 DIAGNOSIS — I69354 Hemiplegia and hemiparesis following cerebral infarction affecting left non-dominant side: Secondary | ICD-10-CM | POA: Diagnosis not present

## 2020-04-25 DIAGNOSIS — M2550 Pain in unspecified joint: Secondary | ICD-10-CM | POA: Diagnosis not present

## 2020-04-25 DIAGNOSIS — R1310 Dysphagia, unspecified: Secondary | ICD-10-CM | POA: Diagnosis not present

## 2020-04-25 DIAGNOSIS — E785 Hyperlipidemia, unspecified: Secondary | ICD-10-CM | POA: Diagnosis not present

## 2020-04-25 DIAGNOSIS — G839 Paralytic syndrome, unspecified: Secondary | ICD-10-CM | POA: Diagnosis not present

## 2020-04-25 DIAGNOSIS — N400 Enlarged prostate without lower urinary tract symptoms: Secondary | ICD-10-CM | POA: Diagnosis not present

## 2020-04-25 DIAGNOSIS — M25571 Pain in right ankle and joints of right foot: Secondary | ICD-10-CM | POA: Diagnosis not present

## 2020-04-25 DIAGNOSIS — G8194 Hemiplegia, unspecified affecting left nondominant side: Secondary | ICD-10-CM | POA: Diagnosis not present

## 2020-04-25 DIAGNOSIS — R5381 Other malaise: Secondary | ICD-10-CM | POA: Diagnosis not present

## 2020-04-25 DIAGNOSIS — Z7401 Bed confinement status: Secondary | ICD-10-CM | POA: Diagnosis not present

## 2020-04-25 DIAGNOSIS — I63411 Cerebral infarction due to embolism of right middle cerebral artery: Secondary | ICD-10-CM | POA: Diagnosis not present

## 2020-04-25 DIAGNOSIS — K5900 Constipation, unspecified: Secondary | ICD-10-CM | POA: Diagnosis not present

## 2020-04-25 DIAGNOSIS — R339 Retention of urine, unspecified: Secondary | ICD-10-CM | POA: Diagnosis not present

## 2020-04-25 DIAGNOSIS — I63511 Cerebral infarction due to unspecified occlusion or stenosis of right middle cerebral artery: Secondary | ICD-10-CM | POA: Diagnosis not present

## 2020-04-25 DIAGNOSIS — T17908D Unspecified foreign body in respiratory tract, part unspecified causing other injury, subsequent encounter: Secondary | ICD-10-CM | POA: Diagnosis not present

## 2020-04-25 DIAGNOSIS — T17908A Unspecified foreign body in respiratory tract, part unspecified causing other injury, initial encounter: Secondary | ICD-10-CM | POA: Diagnosis not present

## 2020-04-25 LAB — BASIC METABOLIC PANEL
Anion gap: 11 (ref 5–15)
BUN: 21 mg/dL (ref 8–23)
CO2: 21 mmol/L — ABNORMAL LOW (ref 22–32)
Calcium: 9.1 mg/dL (ref 8.9–10.3)
Chloride: 98 mmol/L (ref 98–111)
Creatinine, Ser: 0.82 mg/dL (ref 0.61–1.24)
GFR, Estimated: >60 mL/min (ref >60)
Glucose, Bld: 147 mg/dL — ABNORMAL HIGH (ref 70–99)
Potassium: 4.5 mmol/L (ref 3.5–5.1)
Sodium: 130 mmol/L — ABNORMAL LOW (ref 135–145)

## 2020-04-25 LAB — CBC
HCT: 39 % (ref 39.0–52.0)
Hemoglobin: 13.2 g/dL (ref 13.0–17.0)
MCH: 29.5 pg (ref 26.0–34.0)
MCHC: 33.8 g/dL (ref 30.0–36.0)
MCV: 87.2 fL (ref 80.0–100.0)
Platelets: 414 10*3/uL — ABNORMAL HIGH (ref 150–400)
RBC: 4.47 MIL/uL (ref 4.22–5.81)
RDW: 12.2 % (ref 11.5–15.5)
WBC: 10.5 10*3/uL (ref 4.0–10.5)
nRBC: 0 % (ref 0.0–0.2)

## 2020-04-25 LAB — SARS CORONAVIRUS 2 (TAT 6-24 HRS): SARS Coronavirus 2: NEGATIVE

## 2020-04-25 LAB — GLUCOSE, CAPILLARY
Glucose-Capillary: 111 mg/dL — ABNORMAL HIGH (ref 70–99)
Glucose-Capillary: 125 mg/dL — ABNORMAL HIGH (ref 70–99)
Glucose-Capillary: 138 mg/dL — ABNORMAL HIGH (ref 70–99)
Glucose-Capillary: 164 mg/dL — ABNORMAL HIGH (ref 70–99)
Glucose-Capillary: 97 mg/dL (ref 70–99)

## 2020-04-25 MED ORDER — BACLOFEN 5 MG PO TABS: 5.0000 mg | ORAL_TABLET | Freq: Three times a day (TID) | ORAL | 0 refills | Status: DC

## 2020-04-25 MED ORDER — MUPIROCIN 2 % EX OINT: 1.0000 "application " | TOPICAL_OINTMENT | Freq: Two times a day (BID) | CUTANEOUS | 0 refills | Status: AC

## 2020-04-25 MED ORDER — ATORVASTATIN CALCIUM 40 MG PO TABS: 40.0000 mg | ORAL_TABLET | Freq: Every day | ORAL | Status: DC

## 2020-04-25 MED ORDER — POLYETHYLENE GLYCOL 3350 17 G PO PACK: 17.0000 g | PACK | Freq: Every day | ORAL | 0 refills | Status: DC

## 2020-04-25 MED ORDER — ASPIRIN 325 MG PO TABS: 325.0000 mg | ORAL_TABLET | Freq: Every day | ORAL | Status: DC

## 2020-04-25 MED ORDER — SENNOSIDES-DOCUSATE SODIUM 8.6-50 MG PO TABS: 2.0000 | ORAL_TABLET | Freq: Two times a day (BID) | ORAL | Status: DC

## 2020-04-25 MED ORDER — CHLORPROMAZINE HCL 25 MG PO TABS: 12.5000 mg | ORAL_TABLET | Freq: Three times a day (TID) | ORAL | Status: DC

## 2020-04-25 NOTE — Progress Notes (Signed)
Pt medically stable to discharge per MD order. IV and tele removed. Belongings: glasses, hats, protective boot, clothing, cell phone, charger. PTAR transported to [facility] Room 312A. RN called to give report multiple times, unable to reach RN. Pt resting comfortably, all questions from patient and family answered.

## 2020-04-25 NOTE — Plan of Care (Signed)
  Problem: Education: Goal: Knowledge of General Education information will improve Description: Including pain rating scale, medication(s)/side effects and non-pharmacologic comfort measures Outcome: Adequate for Discharge   Problem: Health Behavior/Discharge Planning: Goal: Ability to manage health-related needs will improve Outcome: Adequate for Discharge   Problem: Clinical Measurements: Goal: Ability to maintain clinical measurements within normal limits will improve Outcome: Adequate for Discharge Goal: Will remain free from infection Outcome: Adequate for Discharge Goal: Diagnostic test results will improve Outcome: Adequate for Discharge Goal: Respiratory complications will improve Outcome: Adequate for Discharge Goal: Cardiovascular complication will be avoided Outcome: Adequate for Discharge   Problem: Activity: Goal: Risk for activity intolerance will decrease Outcome: Adequate for Discharge   Problem: Nutrition: Goal: Adequate nutrition will be maintained Outcome: Adequate for Discharge   Problem: Coping: Goal: Level of anxiety will decrease Outcome: Adequate for Discharge   Problem: Elimination: Goal: Will not experience complications related to bowel motility Outcome: Adequate for Discharge Goal: Will not experience complications related to urinary retention Outcome: Adequate for Discharge   Problem: Pain Managment: Goal: General experience of comfort will improve Outcome: Adequate for Discharge   Problem: Safety: Goal: Ability to remain free from injury will improve Outcome: Adequate for Discharge   Problem: Skin Integrity: Goal: Risk for impaired skin integrity will decrease Outcome: Adequate for Discharge   Problem: Education: Goal: Knowledge of disease or condition will improve Outcome: Adequate for Discharge Goal: Knowledge of secondary prevention will improve Outcome: Adequate for Discharge Goal: Knowledge of patient specific risk factors  addressed and post discharge goals established will improve Outcome: Adequate for Discharge Goal: Individualized Educational Video(s) Outcome: Adequate for Discharge   Problem: Coping: Goal: Will verbalize positive feelings about self Outcome: Adequate for Discharge Goal: Will identify appropriate support needs Outcome: Adequate for Discharge   Problem: Health Behavior/Discharge Planning: Goal: Ability to manage health-related needs will improve Outcome: Adequate for Discharge   Problem: Self-Care: Goal: Ability to participate in self-care as condition permits will improve Outcome: Adequate for Discharge Goal: Verbalization of feelings and concerns over difficulty with self-care will improve Outcome: Adequate for Discharge Goal: Ability to communicate needs accurately will improve Outcome: Adequate for Discharge   Problem: Nutrition: Goal: Risk of aspiration will decrease Outcome: Adequate for Discharge Goal: Dietary intake will improve Outcome: Adequate for Discharge   Problem: Ischemic Stroke/TIA Tissue Perfusion: Goal: Complications of ischemic stroke/TIA will be minimized Outcome: Adequate for Discharge   Problem: Safety: Goal: Non-violent Restraint(s) Outcome: Adequate for Discharge

## 2020-04-25 NOTE — TOC Transition Note (Signed)
Transition of Care [facility]) - CM/SW Discharge Note   Patient Details  Name: Gregory Osborn MRN: [id number] Date of Birth: 01/21/1946  Transition of Care [facility] CM/SW Contact:  Baldemar Lenis, LCSW Phone Number: 04/25/2020, 1:50 PM   Clinical Narrative:   Nurse to call report to [phone], Room 312A    Final next level of care: Skilled Nursing Facility Barriers to Discharge: Barriers Resolved   Patient Goals and CMS Choice        Discharge Placement              Patient chooses bed at: [facility] Patient to be transferred to facility by: [facility] Name of family member notified: Demetrius Charity Patient and family notified of of transfer: 04/25/20  Discharge Plan and Services   Discharge Planning Services: CM Consult                                 Social Determinants of Health (SDOH) Interventions     Readmission Risk Interventions No flowsheet data found.

## 2020-04-25 NOTE — Discharge Summary (Addendum)
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, embolic pattern, Active Problems:   Stroke (cerebrum) (HCC)   Aspiration into airway Cerebral edema-resolved Hypertensive urgency-resolved Hyperlipidemia Dysphagia Hyponatremia Urinary retention Right ankle fracture   Allergies as of 04/25/2020   No Known Allergies     Medication List    STOP taking these medications   acetaminophen 500 MG tablet Commonly known as: TYLENOL   ibuprofen 200 MG tablet Commonly known as: ADVIL   lisinopril 5 MG tablet Commonly known as: ZESTRIL   Multi For Him 50+ Tabs     TAKE these medications   alfuzosin 10 MG 24 hr tablet Commonly known as: UROXATRAL Take 10 mg by mouth daily.   aspirin 325 MG tablet Place 1 tablet (325 mg total) into feeding tube daily. Start taking on: April 26, 2020   atorvastatin 40 MG tablet Commonly known as: LIPITOR Place 1 tablet (40 mg total) into feeding tube daily. Start taking on: April 26, 2020   Baclofen 5 MG Tabs Place 5 mg into feeding tube 3 (three) times daily.   chlorproMAZINE 25 MG tablet Commonly known as: THORAZINE Place 0.5 tablets (12.5 mg total) into feeding tube 3 (three) times daily.   mupirocin ointment 2 % Commonly known as: BACTROBAN Place 1 application into the nose 2 (two) times daily for 3 days.   polyethylene glycol 17 g packet Commonly known as: MIRALAX / GLYCOLAX Place 17 g into feeding tube daily. Start taking on: April 26, 2020   senna-docusate 8.6-50 MG tablet Commonly known as: Senokot-S Place 2 tablets into feeding tube 2 (two)  times daily.            Discharge Care Instructions  (From admission, onward)         Start     Ordered   04/25/20 0000  Leave dressing on - Keep it clean, dry, and intact until clinic visit        04/25/20 1214          LABORATORY STUDIES CBC    Component Value Date/Time   WBC 10.5 04/25/2020 1101   RBC 4.47 04/25/2020 1101   HGB 13.2 04/25/2020 1101   HCT 39.0 04/25/2020 1101   PLT 414 (H) 04/25/2020 1101   MCV 87.2 04/25/2020 1101   MCH 29.5 04/25/2020 1101   MCHC 33.8 04/25/2020 1101   RDW 12.2 04/25/2020 1101   LYMPHSABS 0.6 (L) 04/02/2020 2208   MONOABS 0.2 04/02/2020 2208   EOSABS 0.0 04/02/2020 2208   BASOSABS 0.0 04/02/2020 2208   CMP    Component Value Date/Time   NA 130 (L) 04/25/2020 1101   K 4.5 04/25/2020 1101   CL 98 04/25/2020 1101   CO2 21 (L) 04/25/2020 1101   GLUCOSE 147 (H) 04/25/2020 1101   BUN 21 04/25/2020 1101   CREATININE 0.82 04/25/2020 1101   CALCIUM 9.1 04/25/2020 1101   PROT 6.2 (L) 04/09/2020 0423   ALBUMIN 2.9 (L) 04/09/2020 0423   AST 90 (H) 04/09/2020 0423   ALT 160 (H) 04/09/2020 0423   ALKPHOS 101 04/09/2020 0423   BILITOT 0.9 04/09/2020  0423   GFRNONAA >60 04/25/2020 1101   COAGS Lab Results  Component Value Date   INR 1.0 04/02/2020   Lipid Panel    Component Value Date/Time   CHOL 197 04/03/2020 1005   TRIG 109 04/03/2020 1005   HDL 57 04/03/2020 1005   CHOLHDL 3.5 04/03/2020 1005   VLDL 22 04/03/2020 1005   LDLCALC 118 (H) 04/03/2020 1005   HgbA1C  Lab Results  Component Value Date   HGBA1C 5.6 04/03/2020   Urinalysis    Component Value Date/Time   COLORURINE AMBER (A) 04/08/2020 1310   APPEARANCEUR HAZY (A) 04/08/2020 1310   LABSPEC 1.030 04/08/2020 1310   PHURINE 5.0 04/08/2020 1310   GLUCOSEU NEGATIVE 04/08/2020 1310   HGBUR NEGATIVE 04/08/2020 1310   BILIRUBINUR NEGATIVE 04/08/2020 1310   KETONESUR NEGATIVE 04/08/2020 1310   PROTEINUR NEGATIVE 04/08/2020 1310   NITRITE NEGATIVE  04/08/2020 1310   LEUKOCYTESUR TRACE (A) 04/08/2020 1310   Urine Drug Screen     Component Value Date/Time   LABOPIA NONE DETECTED 04/02/2020 1809   COCAINSCRNUR NONE DETECTED 04/02/2020 1809   LABBENZ NONE DETECTED 04/02/2020 1809   AMPHETMU NONE DETECTED 04/02/2020 1809   THCU NONE DETECTED 04/02/2020 1809   LABBARB NONE DETECTED 04/02/2020 1809    Alcohol Level    Component Value Date/Time   ETH <10 04/02/2020 2208     SIGNIFICANT DIAGNOSTIC STUDIES DG Ankle 2 Views Right  Result Date: 04/16/2020 CLINICAL DATA:  Ankle pain. EXAM: RIGHT ANKLE - 2 VIEW COMPARISON:  No prior. FINDINGS: Soft tissue swelling. Punctate bony density noted adjacent to the distal tip of the lateral malleolus. This may represent a tiny fracture fragment, age undetermined. Density noted over the anterior ankle on lateral view may be extrinsic to the patient. Overlapping structures noted over the right ankle on lateral view. Repeat lateral view and oblique views of the ankle can be obtained without overlapping structures to further evaluate if need be. IMPRESSION: Soft tissue swelling. Punctate bony density noted adjacent to the distal tip of the lateral malleolus. This may represent a tiny punctate fracture fragment, age undetermined. Density noted over the anterior ankle on lateral view may be extrinsic to the patient. Repeat lateral view and oblique views of the ankle without overlapping structures (sheets/dressings) can be obtained to further evaluate if need be. Electronically Signed   By: Marcello Moores  Register   On: 04/16/2020 13:51   CT HEAD WO CONTRAST  Result Date: 04/09/2020 CLINICAL DATA:  Stroke follow-up EXAM: CT HEAD WITHOUT CONTRAST TECHNIQUE: Contiguous axial images were obtained from the base of the skull through the vertex without intravenous contrast. COMPARISON:  None. FINDINGS: Brain: Leftward midline shift is worsened 1.4 cm. Progression of subfalcine herniation. Areas of hyperdensity within the  large region hypoattenuation within the right hemisphere have increased slightly. Favored to be cortical rather than subarachnoid. Unchanged right uncal herniation. Vascular: No abnormal hyperdensity of the major intracranial arteries or dural venous sinuses. No intracranial atherosclerosis. Skull: The visualized skull base, calvarium and extracranial soft tissues are normal. Sinuses/Orbits: No fluid levels or advanced mucosal thickening of the visualized paranasal sinuses. No mastoid or middle ear effusion. The orbits are normal. IMPRESSION: 1. Worsening leftward midline shift and subfalcine herniation. 2. Unchanged right uncal herniation. 3. Increased cortical hyperdensity in the right MCA territory favored to represent early laminar necrosis or cortical venous distention. Electronically Signed   By: Ulyses Jarred M.D.   On: 04/09/2020 03:03   CT Head Wo Contrast  Result  Date: 04/05/2020 CLINICAL DATA:  Stroke follow-up EXAM: CT HEAD WITHOUT CONTRAST TECHNIQUE: Contiguous axial images were obtained from the base of the skull through the vertex without intravenous contrast. COMPARISON:  04/04/2020 FINDINGS: Brain: Expected evolution of large right MCA territory infarct. Unchanged appearance contrast staining/blood within the midportion of the infarcted territory. No acute hemorrhage. Leftward midline shift measures 12 mm, previously 6 mm. There is subfalcine herniation of the cingulate gyrus. No hydrocephalus. Right lateral ventricle is nearly effaced. Vascular: No hyperdense vessel or unexpected calcification. Skull: Normal. Negative for fracture or focal lesion. Sinuses/Orbits: No acute finding. Other: None. IMPRESSION: 1. Worsened leftward midline shift measures 12 mm, previously 6 mm. Subfalcine herniation of the right cingulate gyrus. 2. Large right MCA territory infarct with unchanged appearance of contrast staining/blood within the midportion of the infarcted territory. Electronically Signed   By: Ulyses Jarred M.D.   On: 04/05/2020 03:42   CT HEAD WO CONTRAST  Result Date: 04/04/2020 CLINICAL DATA:  Stroke follow-up EXAM: CT HEAD WITHOUT CONTRAST TECHNIQUE: Contiguous axial images were obtained from the base of the skull through the vertex without intravenous contrast. COMPARISON:  Yesterday FINDINGS: Brain: Progressively distinct cytotoxic edema in the right MCA territory. Primarily subarachnoid hemorrhage along the mid right sylvian fissure is non progressed. With progressive swelling there is new midline shift which measures 6 mm no entrapment. No evidence of new infarct. Vascular: High-density right MCA. Skull: Normal. Negative for fracture or focal lesion. Sinuses/Orbits: No acute finding. IMPRESSION: Progressive cytotoxic edema from right MCA infarction with new 6 mm of midline shift. Non progressed hemorrhage. Electronically Signed   By: Monte Fantasia M.D.   On: 04/04/2020 07:04   CT HEAD WO CONTRAST  Result Date: 04/02/2020 CLINICAL DATA:  Follow-up stroke with thrombectomy attempt. EXAM: CT HEAD WITHOUT CONTRAST TECHNIQUE: Contiguous axial images were obtained from the base of the skull through the vertex without intravenous contrast. COMPARISON:  CT studies earlier same day. FINDINGS: Brain: No acute finding affecting the brainstem or left cerebral hemisphere. On the right, there is low-density now seen throughout the right middle cerebral artery territory consistent with complete right MCA territory infarction. This includes at least partial involvement of the basal ganglia. Areas of hyperdensity in the deep insula and frontoparietal junction region could represent a combination of contrast staining and a small amount of hemorrhage. No large confluent hematoma. Mild swelling with early mass-effect, right-to-left shift of 2 mm. No hydrocephalus. No extra-axial collection. Vascular: No new vascular finding. Skull: Normal Sinuses/Orbits: Clear/normal Other: None IMPRESSION: Complete right MCA  territory infarction, with at least partial involvement of the basal ganglia. Areas of hyperdensity in the deep insula and frontoparietal junction region could represent a combination of contrast staining and a small amount of hemorrhage. No large confluent hematoma. Mild swelling with early mass-effect, right-to-left shift of 2 mm. Electronically Signed   By: Nelson Chimes M.D.   On: 04/02/2020 21:45   MR ANGIO HEAD WO CONTRAST  Result Date: 04/03/2020 CLINICAL DATA:  Neuro deficit, acute, stroke suspected. EXAM: MRI HEAD WITHOUT CONTRAST MRA HEAD WITHOUT CONTRAST MRA NECK WITHOUT CONTRAST TECHNIQUE: Multiplanar, multiecho pulse sequences of the brain and surrounding structures were obtained without intravenous contrast. Angiographic images of the Circle of Willis were obtained using MRA technique without intravenous contrast. Angiographic images of the neck were obtained using MRA technique without intravenous contrast. Carotid stenosis measurements (when applicable) are obtained utilizing NASCET criteria, using the distal internal carotid diameter as the denominator. COMPARISON:  Noncontrast head  CT 04/02/2020. Noncontrast head CT and CT angiogram head/neck performed 04/02/2020 FINDINGS: MRI HEAD FINDINGS Brain: A routine coronal T2 weighted sequence was not obtained. Cerebral volume is normal for age. Further progressed from the prior head CT of 04/02/2020, there is an acute/early subacute infarct involving the majority of the right MCA vascular territory. This infarct also extends to involve the right MCA/ACA and right MCA/PCA watershed territories. As compared to the prior head CT, there is more complete involvement of the right basal ganglia. Mass effect has slightly increased, with increased partial effacement of the right lateral ventricle and now 3-4 mm leftward midline shift. There are additional subcentimeter acute infarcts within the right PCA territory within the medial right temporal lobe, and also  within the right cerebellar hemisphere. Possible additional punctate acute infarct within the paramedian right frontal lobe (ACA territory). Redemonstrated small-volume subarachnoid hemorrhage within the right MCA cistern, right sylvian fissure and along the right temporoparietal lobes. Background mild multifocal T2/FLAIR hyperintensity within the cerebral white matter which is nonspecific, but compatible with chronic small vessel ischemic disease. Small chronic infarcts within the bilateral cerebellar hemispheres. Vascular: Reported below. Skull and upper cervical spine: No focal marrow lesion Sinuses/Orbits: Visualized orbits show no acute finding. Trace ethmoid sinus mucosal thickening. Small left maxillary sinus mucous retention cyst. MRA HEAD FINDINGS The intracranial internal carotid arteries are patent. The M1 segment of the right middle cerebral artery is now occluded proximally (previously occluded distally). The M1 left middle cerebral artery is patent. No left M2 proximal branch occlusion or high-grade proximal stenosis. The anterior cerebral arteries are patent. The dominant intracranial right vertebral artery is patent without stenosis. The non dominant and developmentally diminutive left vertebral artery is patent. The basilar artery is patent. The posterior cerebral arteries are patent. Sizable right posterior communicating artery. The left posterior communicating artery is hypoplastic or absent. No intracranial aneurysm is identified. MRA NECK FINDINGS The common carotid and visualized internal carotid arteries are patent within the neck without hemodynamically significant stenosis. Retropharyngeal course of the proximal internal carotid arteries bilaterally. The visualized vertebral arteries are patent within the neck without hemodynamically significant stenosis. The right vertebral artery is strongly dominant, and the left is developmentally diminutive. MRI brain impressions #1, #2, #3 and #4 and  MRA head impression #1 will be called to the ordering clinician or representative by the Radiologist Assistant, and communication documented in the PACS or [employer]. IMPRESSION: MRI brain: 1. 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 This infarct also involves the right MCA/ACA and MCA/PCA watershed territories. As compared to the prior CT, there is now more complete involvement of the right basal ganglia. 2. Slightly increased mass effect with increased partial effacement of the right lateral ventricle and now 3-4 mm leftward midline shift. 3. Unchanged small-volume subarachnoid hemorrhage within the right MCA cistern, sylvian fissure and along the right temporoparietal lobes. 4. Additional subcentimeter acute infarcts within the medial right temporal lobe (PCA vascular territory) and right cerebellar hemisphere. 5. Stable background mild chronic small vessel ischemic disease. 6. 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 MRA head: 1. The M1 segment of the right middle cerebral artery is now occluded at its proximal aspect (previously occluded at its distal aspect). There is no appreciable flow-related signal within right MCA vessels more distally. 2. Elsewhere, no intracranial large vessel occlusion or proximal high-grade arterial stenosis is identified. MRA  neck: The common carotid, internal carotid and vertebral arteries are patent within the neck without hemodynamically significant stenosis. Electronically Signed   By: Kellie Simmering DO   On: 04/03/2020 17:53   MR ANGIO NECK WO CONTRAST  Result Date: 04/03/2020 CLINICAL DATA:  Neuro deficit, acute, stroke suspected. EXAM: MRI HEAD WITHOUT CONTRAST MRA HEAD WITHOUT CONTRAST MRA NECK WITHOUT CONTRAST TECHNIQUE: Multiplanar, multiecho pulse sequences of the brain and surrounding structures were obtained  without intravenous contrast. Angiographic images of the Circle of Willis were obtained using MRA technique without intravenous contrast. Angiographic images of the neck were obtained using MRA technique without intravenous contrast. Carotid stenosis measurements (when applicable) are obtained utilizing NASCET criteria, using the distal internal carotid diameter as the denominator. COMPARISON:  Noncontrast head CT 04/02/2020. Noncontrast head CT and CT angiogram head/neck performed 04/02/2020 FINDINGS: MRI HEAD FINDINGS Brain: A routine coronal T2 weighted sequence was not obtained. Cerebral volume is normal for age. Further progressed from the prior head CT of 04/02/2020, there is an acute/early subacute infarct involving the majority of the right MCA vascular territory. This infarct also extends to involve the right MCA/ACA and right MCA/PCA watershed territories. As compared to the prior head CT, there is more complete involvement of the right basal ganglia. Mass effect has slightly increased, with increased partial effacement of the right lateral ventricle and now 3-4 mm leftward midline shift. There are additional subcentimeter acute infarcts within the right PCA territory within the medial right temporal lobe, and also within the right cerebellar hemisphere. Possible additional punctate acute infarct within the paramedian right frontal lobe (ACA territory). Redemonstrated small-volume subarachnoid hemorrhage within the right MCA cistern, right sylvian fissure and along the right temporoparietal lobes. Background mild multifocal T2/FLAIR hyperintensity within the cerebral white matter which is nonspecific, but compatible with chronic small vessel ischemic disease. Small chronic infarcts within the bilateral cerebellar hemispheres. Vascular: Reported below. Skull and upper cervical spine: No focal marrow lesion Sinuses/Orbits: Visualized orbits show no acute finding. Trace ethmoid sinus mucosal thickening. Small  left maxillary sinus mucous retention cyst. MRA HEAD FINDINGS The intracranial internal carotid arteries are patent. The M1 segment of the right middle cerebral artery is now occluded proximally (previously occluded distally). The M1 left middle cerebral artery is patent. No left M2 proximal branch occlusion or high-grade proximal stenosis. The anterior cerebral arteries are patent. The dominant intracranial right vertebral artery is patent without stenosis. The non dominant and developmentally diminutive left vertebral artery is patent. The basilar artery is patent. The posterior cerebral arteries are patent. Sizable right posterior communicating artery. The left posterior communicating artery is hypoplastic or absent. No intracranial aneurysm is identified. MRA NECK FINDINGS The common carotid and visualized internal carotid arteries are patent within the neck without hemodynamically significant stenosis. Retropharyngeal course of the proximal internal carotid arteries bilaterally. The visualized vertebral arteries are patent within the neck without hemodynamically significant stenosis. The right vertebral artery is strongly dominant, and the left is developmentally diminutive. MRI brain impressions #1, #2, #3 and #4 and MRA head impression #1 will be called to the ordering clinician or representative by the Radiologist Assistant, and communication documented in the PACS or [employer]. IMPRESSION: MRI brain: 1. 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 This infarct also involves the right MCA/ACA and MCA/PCA watershed territories. As compared to the prior CT, there is now more complete involvement of  the right basal ganglia. 2. Slightly increased mass effect with increased partial effacement of the right lateral ventricle and now 3-4 mm leftward midline shift. 3. Unchanged  small-volume subarachnoid hemorrhage within the right MCA cistern, sylvian fissure and along the right temporoparietal lobes. 4. Additional subcentimeter acute infarcts within the medial right temporal lobe (PCA vascular territory) and right cerebellar hemisphere. 5. Stable background mild chronic small vessel ischemic disease. 6. 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 MRA head: 1. The M1 segment of the right middle cerebral artery is now occluded at its proximal aspect (previously occluded at its distal aspect). There is no appreciable flow-related signal within right MCA vessels more distally. 2. Elsewhere, no intracranial large vessel occlusion or proximal high-grade arterial stenosis is identified. MRA neck: The common carotid, internal carotid and vertebral arteries are patent within the neck without hemodynamically significant stenosis. Electronically Signed   By: Kellie Simmering DO   On: 04/03/2020 17:53   MR BRAIN WO CONTRAST  Result Date: 04/03/2020 CLINICAL DATA:  Neuro deficit, acute, stroke suspected. EXAM: MRI HEAD WITHOUT CONTRAST MRA HEAD WITHOUT CONTRAST MRA NECK WITHOUT CONTRAST TECHNIQUE: Multiplanar, multiecho pulse sequences of the brain and surrounding structures were obtained without intravenous contrast. Angiographic images of the Circle of Willis were obtained using MRA technique without intravenous contrast. Angiographic images of the neck were obtained using MRA technique without intravenous contrast. Carotid stenosis measurements (when applicable) are obtained utilizing NASCET criteria, using the distal internal carotid diameter as the denominator. COMPARISON:  Noncontrast head CT 04/02/2020. Noncontrast head CT and CT angiogram head/neck performed 04/02/2020 FINDINGS: MRI HEAD FINDINGS Brain: A routine coronal T2 weighted sequence was not obtained. Cerebral volume is normal for age. Further progressed from the prior head CT of 04/02/2020, there is an  acute/early subacute infarct involving the majority of the right MCA vascular territory. This infarct also extends to involve the right MCA/ACA and right MCA/PCA watershed territories. As compared to the prior head CT, there is more complete involvement of the right basal ganglia. Mass effect has slightly increased, with increased partial effacement of the right lateral ventricle and now 3-4 mm leftward midline shift. There are additional subcentimeter acute infarcts within the right PCA territory within the medial right temporal lobe, and also within the right cerebellar hemisphere. Possible additional punctate acute infarct within the paramedian right frontal lobe (ACA territory). Redemonstrated small-volume subarachnoid hemorrhage within the right MCA cistern, right sylvian fissure and along the right temporoparietal lobes. Background mild multifocal T2/FLAIR hyperintensity within the cerebral white matter which is nonspecific, but compatible with chronic small vessel ischemic disease. Small chronic infarcts within the bilateral cerebellar hemispheres. Vascular: Reported below. Skull and upper cervical spine: No focal marrow lesion Sinuses/Orbits: Visualized orbits show no acute finding. Trace ethmoid sinus mucosal thickening. Small left maxillary sinus mucous retention cyst. MRA HEAD FINDINGS The intracranial internal carotid arteries are patent. The M1 segment of the right middle cerebral artery is now occluded proximally (previously occluded distally). The M1 left middle cerebral artery is patent. No left M2 proximal branch occlusion or high-grade proximal stenosis. The anterior cerebral arteries are patent. The dominant intracranial right vertebral artery is patent without stenosis. The non dominant and developmentally diminutive left vertebral artery is patent. The basilar artery is patent. The posterior cerebral arteries are patent. Sizable right posterior communicating artery. The left posterior  communicating artery is hypoplastic or absent. No intracranial aneurysm is identified. MRA NECK FINDINGS The common carotid and visualized internal carotid arteries are patent  within the neck without hemodynamically significant stenosis. Retropharyngeal course of the proximal internal carotid arteries bilaterally. The visualized vertebral arteries are patent within the neck without hemodynamically significant stenosis. The right vertebral artery is strongly dominant, and the left is developmentally diminutive. MRI brain impressions #1, #2, #3 and #4 and MRA head impression #1 will be called to the ordering clinician or representative by the Radiologist Assistant, and communication documented in the PACS or [employer]. IMPRESSION: MRI brain: 1. 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 This infarct also involves the right MCA/ACA and MCA/PCA watershed territories. As compared to the prior CT, there is now more complete involvement of the right basal ganglia. 2. Slightly increased mass effect with increased partial effacement of the right lateral ventricle and now 3-4 mm leftward midline shift. 3. Unchanged small-volume subarachnoid hemorrhage within the right MCA cistern, sylvian fissure and along the right temporoparietal lobes. 4. Additional subcentimeter acute infarcts within the medial right temporal lobe (PCA vascular territory) and right cerebellar hemisphere. 5. Stable background mild chronic small vessel ischemic disease. 6. 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 MRA head: 1. The M1 segment of the right middle cerebral artery is now occluded at its proximal aspect (previously occluded at its distal aspect). There is no appreciable flow-related signal within right MCA vessels more distally. 2. Elsewhere, no intracranial large vessel occlusion or  proximal high-grade arterial stenosis is identified. MRA neck: The common carotid, internal carotid and vertebral arteries are patent within the neck without hemodynamically significant stenosis. Electronically Signed   By: Kellie Simmering DO   On: 04/03/2020 17:53   IR CT [facility]  Result Date: 04/03/2020 INDICATION: 75 year old male with past medical history is significant for UTI, urinary retention, recent hematuria and appendectomy presenting with sudden onset left-sided hemiplegia. Left known well at 11:25 a.m. on 04/02/2020. No IV tPA given due to recent hematuria. Premorbid Rankin scale 0; NIHSS 15. Head CT showed early ischemic changes in the frontoparietal and insular cortex (aspects 6) and CT angiogram of the head and neck showed a distal right M1/MCA a with poor collaterals. He was transferred to our service for a mechanical thrombectomy. EXAM: Diagnostic cerebral angiogram Mechanical thrombectomy Flat panel head CT COMPARISON:  CT/CT angiogram of head and neck April 02, 2020. MEDICATIONS: 10 mg of verapamil intra arterial ANESTHESIA/SEDATION: The procedure was performed in the general anesthesia. CONTRAST:  52m of Omnipaque 300 FLUOROSCOPY TIME:  Fluoroscopy Time:  minutes  seconds ( mGy). COMPLICATIONS: SIR Level A - No therapy, no consequence. TECHNIQUE: Informed written consent was obtained from the patient's daughter after a thorough discussion of the procedural risks, benefits and alternatives. All questions were addressed. Maximal Sterile Barrier Technique was utilized including caps, mask, sterile gowns, sterile gloves, sterile drape, hand hygiene and skin antiseptic. A timeout was performed prior to the initiation of the procedure. The right groin was prepped and draped in the usual sterile fashion. Using a micropuncture kit and the modified Seldinger technique, access was gained to the right common femoral artery and an 8 French sheath was placed. Under fluoroscopy, an 8 [place]  balloon guide catheter was navigated over a 6 [place] 2 catheter and a 0.035" Terumo Glidewire into the aortic arch. The catheter was placed into the right common carotid artery and then advanced into the right internal carotid artery. The inner catheter was removed. Frontal  and lateral angiograms of the head were obtained. FINDINGS: 1. Increased tortuosity of the cervical right ICA. 2. Occlusion of the distal right M1/MCA. PROCEDURE: Under biplane roadmap, a zoom 71 aspiration catheter was navigated over a phenom 21 microcatheter and a synchro support microguidewire into the cavernous segment of the right ICA. The microcatheter was then navigated over the wire into the right M1 segment. Then, the aspiration catheter was advanced to the level of occlusion and connected to a penumbra aspiration pump. The guiding catheter balloon was inflated. The aspiration catheter was removed under constant aspiration. 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 Under biplane roadmap, a zoom 71 aspiration catheter was navigated over a phenom 21 microcatheter and a synchro support microguidewire into the cavernous segment of the right ICA. The microcatheter was then navigated over the wire into the right M1 segment. Then, the aspiration catheter was advanced to the level of occlusion and connected to a penumbra aspiration pump. The guiding catheter balloon was inflated. The aspiration catheter was removed under constant aspiration. 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 Under biplane roadmap, a zoom 71 aspiration catheter was navigated over a phenom 21 microcatheter and a synchro support microguidewire into the cavernous segment of the right ICA. The microcatheter was then navigated over the wire into the right M2/MCA middle division branch. Then, a 6  mm embotrap stent retriever was deployed spanning the distal M1 and M2 segment. The device was allowed to intercalated with the clot for 4 minutes. The microcatheter was removed. The aspiration catheter was advanced to the level of occlusion and connected to a penumbra aspiration pump. The guiding catheter balloon was inflated. The thrombectomy device and aspiration catheter were removed under constant aspiration. 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 Under biplane roadmap, a zoom 71 aspiration catheter was navigated over a phenom 21 microcatheter and a synchro support microguidewire into the cavernous segment of the right ICA. The microcatheter was then navigated over the wire into the right M2/MCA middle division branch. Then, a 5 x 37 mm embotrap stent retriever was deployed spanning the distal M1 and M2 segment. The device was allowed to intercalated with the clot for 4 minutes. The microcatheter was removed. The aspiration catheter was advanced to the level of occlusion and connected to a penumbra aspiration pump. The guiding catheter balloon was inflated. The thrombectomy device and aspiration catheter were removed under constant aspiration. 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 Delayed follow-up showed reocclusion. Under biplane roadmap, a zoom 71 aspiration catheter was navigated over a phenom 21 microcatheter and a synchro support microguidewire into the cavernous segment of the right ICA. The microcatheter was then navigated over the wire into the right M2/MCA middle division branch. Then, a 5 x 37 mm embotrap stent retriever was deployed spanning the distal M1 and M2 segment. The device was allowed to intercalated  with the clot for 4 minutes. The microcatheter was removed. The aspiration catheter was advanced to the level of occlusion and connected to a penumbra aspiration pump. The guiding catheter balloon was inflated. The thrombectomy device and aspiration catheter were removed under constant aspiration.  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 Delayed follow-up showed reocclusion. Under biplane roadmap, a zoom 71 aspiration catheter was navigated over a phenom 21 microcatheter and a synchro support microguidewire into the cavernous segment of the right ICA. The microcatheter was then navigated over the wire into the right M2/MCA posterior division branch. Then, a 5 x 37 mm embotrap stent retriever was deployed spanning the distal M1 and M2 segment. The device was allowed to intercalated with the clot for 4 minutes. The microcatheter was removed. The aspiration catheter was advanced to the level of occlusion and connected to a penumbra aspiration pump. The guiding catheter balloon was inflated. The thrombectomy device and aspiration catheter were removed under constant aspiration. 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 Delayed follow-up showed reocclusion. Flat panel CT of the head was obtained and post processed in a separate workstation with concurrent attending physician supervision. Selected images were sent to PACS. No evidence of complication. Under biplane roadmap, a zoom 71 aspiration catheter was navigated over a phenom 21 microcatheter and a synchro support microguidewire into the cavernous segment of the right ICA. The microcatheter was then navigated over the wire into the right M2/MCA posterior division branch. Then, a  tiger 7 stent retriever was deployed spanning the distal M1 and M2 segment. The device was allowed to intercalated with the clot for 4 minutes. The microcatheter was removed. The aspiration catheter was advanced to the level of occlusion and connected to a penumbra aspiration pump. The guiding catheter balloon was inflated. The thrombectomy device and aspiration catheter were removed under constant aspiration. 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 Delayed follow-up showed reocclusion. Under biplane roadmap, a zoom 71 aspiration catheter was navigated over a phenom 21 microcatheter and a synchro support microguidewire into the cavernous segment of the right ICA. The microcatheter was then navigated over the wire into the right M2/MCA posterior division branch. Then, a 4 x 40 mm solitaire stent retriever was deployed spanning the distal M1 and M2 segment. The device was allowed to intercalated with the clot for 4 minutes. The microcatheter was removed. The aspiration catheter was advanced to the level of occlusion and connected to a penumbra aspiration pump. The guiding catheter balloon was inflated. The thrombectomy device and aspiration catheter were removed under constant aspiration. 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 Delayed follow-up showed reocclusion. Under biplane roadmap, a zoom 71 aspiration catheter was navigated over a phenom 21 microcatheter and a synchro support microguidewire into the cavernous segment of the right ICA. The microcatheter was then navigated over the wire into the right M2/MCA middle division branch. Then, a 4 x 40 mm solitaire stent retriever was deployed spanning the distal M1 and M2 segment.  The device was allowed to intercalated with the clot for 4 minutes. The microcatheter was removed. The aspiration catheter was advanced to the level of occlusion and connected to a penumbra aspiration pump. The guiding catheter balloon was inflated. The thrombectomy device and aspiration catheter were removed under constant aspiration. Follow-up angiogram with  magnified frontal and lateral views of the head showed persistent near occlusion of the distal right M1/MCA with some contrast penetration into distal branches. Delayed follow-up showed reocclusion. Flat panel CT of the head was obtained and post processed in a separate workstation with concurrent attending physician supervision. Selected images were sent to PACS. Small contrast extravasation seen into the right sylvian fissure. The catheter construct was subsequently withdrawn. Right common femoral artery angiograms were obtained frontal and lateral views. The femoral sheath was then exchanged over the wire for a Perclose ProGlide which was utilized for access closure. Immediate hemostasis was achieved. IMPRESSION: Unsuccessful mechanical thrombectomy for treatment of a distal right M1/MCA occlusion. Multiple attempts performed with temporary partial recanalization followed by subsequent reocclusion. A total of 8 passes were performed in different branches without recanalization (TICI0). PLAN: Patient was transferred to ICU for continued care. Electronically Signed   By: Pedro Earls M.D.   On: 04/03/2020 13:11   IR [place]  Result Date: 04/03/2020 INDICATION: 75 year old male with past medical history is significant for UTI, urinary retention, recent hematuria and appendectomy presenting with sudden onset left-sided hemiplegia. Left known well at 11:25 a.m. on 04/02/2020. No IV tPA given due to recent hematuria. Premorbid Rankin scale 0; NIHSS 15. Head CT showed early ischemic changes in the frontoparietal and insular cortex (aspects  6) and CT angiogram of the head and neck showed a distal right M1/MCA a with poor collaterals. He was transferred to our service for a mechanical thrombectomy. EXAM: Diagnostic cerebral angiogram Mechanical thrombectomy Flat panel head CT COMPARISON:  CT/CT angiogram of head and neck April 02, 2020. MEDICATIONS: 10 mg of verapamil intra arterial ANESTHESIA/SEDATION: The procedure was performed in the general anesthesia. CONTRAST:  53m of Omnipaque 300 FLUOROSCOPY TIME:  Fluoroscopy Time:  minutes  seconds ( mGy). COMPLICATIONS: SIR Level A - No therapy, no consequence. TECHNIQUE: Informed written consent was obtained from the patient's daughter after a thorough discussion of the procedural risks, benefits and alternatives. All questions were addressed. Maximal Sterile Barrier Technique was utilized including caps, mask, sterile gowns, sterile gloves, sterile drape, hand hygiene and skin antiseptic. A timeout was performed prior to the initiation of the procedure. The right groin was prepped and draped in the usual sterile fashion. Using a micropuncture kit and the modified Seldinger technique, access was gained to the right common femoral artery and an 8 French sheath was placed. Under fluoroscopy, an 8 [place] balloon guide catheter was navigated over a 6 [place] 2 catheter and a 0.035" Terumo Glidewire into the aortic arch. The catheter was placed into the right common carotid artery and then advanced into the right internal carotid artery. The inner catheter was removed. Frontal and lateral angiograms of the head were obtained. FINDINGS: 1. Increased tortuosity of the cervical right ICA. 2. Occlusion of the distal right M1/MCA. PROCEDURE: Under biplane roadmap, a zoom 71 aspiration catheter was navigated over a phenom 21 microcatheter and a synchro support microguidewire into the cavernous segment of the right ICA. The microcatheter was then navigated over the wire into the right M1 segment.  Then, the aspiration catheter was advanced to the level of occlusion and connected to a penumbra aspiration pump. The guiding catheter balloon was inflated. The aspiration catheter was removed under constant aspiration. 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 Under biplane roadmap, a zoom 71 aspiration catheter was navigated over a phenom  21 microcatheter and a synchro support microguidewire into the cavernous segment of the right ICA. The microcatheter was then navigated over the wire into the right M1 segment. Then, the aspiration catheter was advanced to the level of occlusion and connected to a penumbra aspiration pump. The guiding catheter balloon was inflated. The aspiration catheter was removed under constant aspiration. 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 Under biplane roadmap, a zoom 71 aspiration catheter was navigated over a phenom 21 microcatheter and a synchro support microguidewire into the cavernous segment of the right ICA. The microcatheter was then navigated over the wire into the right M2/MCA middle division branch. Then, a 6 mm embotrap stent retriever was deployed spanning the distal M1 and M2 segment. The device was allowed to intercalated with the clot for 4 minutes. The microcatheter was removed. The aspiration catheter was advanced to the level of occlusion and connected to a penumbra aspiration pump. The guiding catheter balloon was inflated. The thrombectomy device and aspiration catheter were removed under constant aspiration. 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 Under biplane roadmap, a zoom 71 aspiration catheter was navigated over a phenom 21 microcatheter and  a synchro support microguidewire into the cavernous segment of the right ICA. The microcatheter was then navigated over the wire into the right M2/MCA middle division branch. Then, a 5 x 37 mm embotrap stent retriever was deployed spanning the distal M1 and M2 segment. The device was allowed to intercalated with the clot for 4 minutes. The microcatheter was removed. The aspiration catheter was advanced to the level of occlusion and connected to a penumbra aspiration pump. The guiding catheter balloon was inflated. The thrombectomy device and aspiration catheter were removed under constant aspiration. 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 Delayed follow-up showed reocclusion. Under biplane roadmap, a zoom 71 aspiration catheter was navigated over a phenom 21 microcatheter and a synchro support microguidewire into the cavernous segment of the right ICA. The microcatheter was then navigated over the wire into the right M2/MCA middle division branch. Then, a 5 x 37 mm embotrap stent retriever was deployed spanning the distal M1 and M2 segment. The device was allowed to intercalated with the clot for 4 minutes. The microcatheter was removed. The aspiration catheter was advanced to the level of occlusion and connected to a penumbra aspiration pump. The guiding catheter balloon was inflated. The thrombectomy device and aspiration catheter were removed under constant aspiration. 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 Delayed follow-up showed reocclusion. Under biplane roadmap, a zoom 71 aspiration catheter was navigated over a phenom 21 microcatheter and a synchro support microguidewire into the cavernous segment of the right ICA. The  microcatheter was then navigated over the wire into the right M2/MCA posterior division branch. Then, a 5 x 37 mm embotrap stent retriever was deployed spanning the distal M1 and M2 segment. The device was allowed to intercalated with the clot for 4 minutes. The microcatheter was removed. The aspiration catheter was advanced to the level of occlusion and connected to a penumbra aspiration pump. The guiding catheter balloon was  inflated. The thrombectomy device and aspiration catheter were removed under constant aspiration. 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 Delayed follow-up showed reocclusion. Flat panel CT of the head was obtained and post processed in a separate workstation with concurrent attending physician supervision. Selected images were sent to PACS. No evidence of complication. Under biplane roadmap, a zoom 71 aspiration catheter was navigated over a phenom 21 microcatheter and a synchro support microguidewire into the cavernous segment of the right ICA. The microcatheter was then navigated over the wire into the right M2/MCA posterior division branch. Then, a tiger 7 stent retriever was deployed spanning the distal M1 and M2 segment. The device was allowed to intercalated with the clot for 4 minutes. The microcatheter was removed. The aspiration catheter was advanced to the level of occlusion and connected to a penumbra aspiration pump. The guiding catheter balloon was inflated. The thrombectomy device and aspiration catheter were removed under constant aspiration. 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 Delayed follow-up showed reocclusion. Under biplane roadmap, a zoom 71 aspiration catheter was navigated over a  phenom 21 microcatheter and a synchro support microguidewire into the cavernous segment of the right ICA. The microcatheter was then navigated over the wire into the right M2/MCA posterior division branch. Then, a 4 x 40 mm solitaire stent retriever was deployed spanning the distal M1 and M2 segment. The device was allowed to intercalated with the clot for 4 minutes. The microcatheter was removed. The aspiration catheter was advanced to the level of occlusion and connected to a penumbra aspiration pump. The guiding catheter balloon was inflated. The thrombectomy device and aspiration catheter were removed under constant aspiration. 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 Delayed follow-up showed reocclusion. Under biplane roadmap, a zoom 71 aspiration catheter was navigated over a phenom 21 microcatheter and a synchro support microguidewire into the cavernous segment of the right ICA. The microcatheter was then navigated over the wire into the right M2/MCA middle division branch. Then, a 4 x 40 mm solitaire stent retriever was deployed spanning the distal M1 and M2 segment. The device was allowed to intercalated with the clot for 4 minutes. The microcatheter was removed. The aspiration catheter was advanced to the level of occlusion and connected to a penumbra aspiration pump. The guiding catheter balloon was inflated. The thrombectomy device and aspiration catheter were removed under constant aspiration. 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 Delayed follow-up showed reocclusion. Flat panel CT of the head was obtained and post processed in a separate workstation with concurrent attending physician supervision. Selected images  were sent to PACS. Small contrast extravasation seen into the right sylvian fissure. The catheter construct was subsequently withdrawn. Right common femoral artery angiograms were obtained frontal and lateral views. The femoral sheath was then exchanged over the wire for a Perclose ProGlide which was utilized for access closure. Immediate hemostasis was achieved. IMPRESSION: Unsuccessful mechanical thrombectomy for treatment of a distal right M1/MCA occlusion. Multiple attempts performed with temporary partial recanalization followed by subsequent reocclusion. A total of 8  passes were performed in different branches without recanalization (TICI0). PLAN: Patient was transferred to ICU for continued care. Electronically Signed   By: Pedro Earls M.D.   On: 04/03/2020 13:11   DG CHEST PORT 1 VIEW  Result Date: 04/08/2020 CLINICAL DATA:  Fever.  Increased congestion.  Possible aspiration. EXAM: PORTABLE CHEST 1 VIEW COMPARISON:  None. FINDINGS: The heart size is normal. Small bore feeding tube courses off the inferior border of the film. Atherosclerotic changes are noted at the aortic arch. Right-sided PICC line terminates in the distal SVC. Lung volumes are low. No edema or effusion is present. No focal airspace disease is evident. IMPRESSION: 1. Low lung volumes. 2. No acute cardiopulmonary disease. 3. Right-sided PICC line terminates in the distal SVC. Electronically Signed   By: San Morelle M.D.   On: 04/08/2020 12:22   DG Swallowing Func-Speech Pathology  Result Date: 04/15/2020 Objective Swallowing Evaluation: Type of Study: MBS-Modified Barium Swallow Study  Patient Details Name: Shyheem Whitham MRN: [id number] Date of Birth: 1945-10-20 Today's Date: 04/15/2020 Time: SLP Start Time (ACUTE ONLY): 4680 -SLP Stop Time (ACUTE ONLY): 1005 SLP Time Calculation (min) (ACUTE ONLY): 40 min Past Medical History: No past medical history on file. Past Surgical History: Past Surgical History:  Procedure Laterality Date . IR CT HEAD LTD  04/03/2020 . IR CT HEAD LTD  04/03/2020 . IR PERCUTANEOUS ART THROMBECTOMY/INFUSION INTRACRANIAL INC DIAG ANGIO  04/03/2020 . RADIOLOGY WITH ANESTHESIA N/A 04/02/2020  Procedure: IR WITH ANESTHESIA;  Surgeon: Pedro Earls, MD;  Location: [facility];  Service: Radiology;  Laterality: N/A; HPI: Rilen Shukla is a 75 y.o. male with a history of UTI, urinary retention, hematuria, and appendectomy. He was seen by his urologist yesterday, and was doing well. Plans were made to undergo ultrasound, and to treat for UTI. He has been doing well up until 11:25am when he was noted to develop left hemiplegia. EMS was contacted, and took him to the hospital ED where stroke workup was undertaken. He was noted to have left hemiplegia and neglect. He was not given tPA due to the hematuria. Transferred urgently for thrombectomy. 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 2/19 presented with clinical decline, somnolence, not following commands. CT head showed increase midline shift to 12 mm and edema.  2/21 with some improved mentation.  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 Patient alert and pleasant however presented with significant difficulty sustaining attention, highly distracted requiring max verbal, visual, and tactile cueing to attend to task. Severe left sided lean/push, grabbing for objects in attempts to stabalize self. Overall, swallowing physiology largely intact with what appears to be mild-mod oral weakness and mild laryhgeal/pharyngeal weakness with decreased anterior laryngeal movement, mildly decreased epiglottic deflection, and mildly decreased UES relaxation resltuing in only trace-min pharyngeal residuals post swallow and penetration,  at times deep to the vocal cords, during the swallow without sensation. Cognitive deficits however result in poor attention to bolus, severe oral holding and decreased posterior propulsion with eventual anterior labial spillage and premature spillage/delayed swallow initiation both before and after the swallow (oral residuals). Patient demonstrating limited intellectual awareness of deficits and poor attention significantly impacting ability to follow directions in attempt to compensate with resultant ongoing penetration of residuals.  Oral phase most timely with thin liquids via straw howver penetation continues.  SLP feels strongly that at this time, cognition is primary limiting factor to initiation of a full po diet. Recommend NPO with water protocol (sips and chips after oral care only). SLP will continue to work with patient. It does appear that based on review of notes that alertness and cognition is improving. Prognosis for ability to advance diet good wtih continued improvement. SLP will continue to f/u at bedside with focus on patient's ability to attend to pos and utilize timely oral transit of bolus and initiation of pharyngeal swallow as an indicator for ability to participate in repeat MBS and/or advance diet. SLP Visit Diagnosis Dysphagia, oropharyngeal phase (R13.12) Attention and concentration deficit following -- Frontal lobe and executive function deficit following -- Impact on safety and function Moderate aspiration risk;Risk for inadequate nutrition/hydration   CHL IP TREATMENT RECOMMENDATION 04/15/2020 Treatment Recommendations Therapy as outlined in treatment plan below   Prognosis 04/15/2020 Prognosis for Safe Diet Advancement Good Barriers to Reach Goals Cognitive deficits Barriers/Prognosis Comment -- CHL IP DIET RECOMMENDATION 04/15/2020 SLP Diet Recommendations Free water protocol after oral care;NPO;NPO except meds Liquid Administration via -- Medication Administration Crushed with puree  Compensations Lingual sweep for clearance of pocketing;Minimize environmental distractions;Monitor for anterior loss Postural Changes Seated upright at 90 degrees   CHL IP OTHER RECOMMENDATIONS 04/15/2020 Recommended Consults -- Oral Care Recommendations Oral care before and after PO;Oral care QID Other Recommendations Have oral suction available   CHL IP FOLLOW UP RECOMMENDATIONS 04/15/2020 Follow up Recommendations Inpatient Rehab   CHL IP FREQUENCY AND DURATION 04/15/2020 Speech Therapy Frequency (ACUTE ONLY) min 3x week Treatment Duration 2 weeks      CHL IP ORAL PHASE 04/15/2020 Oral Phase Impaired Oral - Pudding Teaspoon -- Oral - Pudding Cup -- Oral - Honey Teaspoon Left anterior bolus loss;Reduced posterior propulsion;Holding of bolus;Left pocketing in lateral sulci;Lingual/palatal residue;Delayed oral transit;Decreased bolus cohesion;Premature spillage Oral - Honey Cup Left anterior bolus loss;Reduced posterior propulsion;Holding of bolus;Left pocketing in lateral sulci;Lingual/palatal residue;Delayed oral transit;Decreased bolus cohesion Oral - Nectar Teaspoon Left anterior bolus loss;Reduced posterior propulsion;Holding of bolus;Left pocketing in lateral sulci;Lingual/palatal residue;Delayed oral transit;Decreased bolus cohesion Oral - Nectar Cup Left anterior bolus loss;Left pocketing in lateral sulci;Reduced posterior propulsion;Holding of bolus;Lingual/palatal residue;Delayed oral transit;Decreased bolus cohesion Oral - Nectar Straw NT Oral - Thin Teaspoon NT Oral - Thin Cup Left anterior bolus loss;Holding of bolus;Reduced posterior propulsion;Left pocketing in lateral sulci;Delayed oral transit;Decreased bolus cohesion Oral - Thin Straw Left anterior bolus loss;Reduced posterior propulsion;Holding of bolus;Left pocketing in lateral sulci;Lingual/palatal residue;Delayed oral transit;Decreased bolus cohesion Oral - Puree Left anterior bolus loss;Reduced posterior propulsion;Holding of bolus;Left pocketing in  lateral sulci;Lingual/palatal residue;Delayed oral transit;Decreased bolus cohesion Oral - Mech Soft -- Oral - Regular -- Oral - Multi-Consistency -- Oral - Pill -- Oral Phase - Comment --  CHL IP PHARYNGEAL PHASE 04/15/2020 Pharyngeal Phase Impaired Pharyngeal- Pudding Teaspoon -- Pharyngeal -- Pharyngeal- Pudding Cup -- Pharyngeal -- Pharyngeal- Honey Teaspoon Delayed swallow initiation-pyriform sinuses;Reduced epiglottic inversion;Reduced anterior laryngeal mobility;Reduced airway/laryngeal closure;Reduced tongue base retraction;Pharyngeal residue - valleculae;Pharyngeal residue - pyriform;Penetration/Aspiration during swallow;Penetration/Apiration after swallow Pharyngeal Material enters airway, remains ABOVE vocal cords and not ejected out Pharyngeal- Honey Cup Delayed swallow initiation-pyriform sinuses;Reduced epiglottic inversion;Reduced anterior laryngeal mobility;Reduced airway/laryngeal closure;Reduced tongue base retraction;Pharyngeal residue - valleculae;Pharyngeal residue - pyriform;Penetration/Aspiration during swallow;Penetration/Apiration after swallow Pharyngeal Material enters airway, remains ABOVE vocal cords and not ejected out Pharyngeal- Nectar Teaspoon Delayed swallow initiation-pyriform sinuses;Reduced epiglottic inversion;Reduced anterior laryngeal mobility;Reduced airway/laryngeal closure;Reduced tongue base retraction;Pharyngeal residue - valleculae;Pharyngeal residue - pyriform;Penetration/Aspiration  during swallow;Penetration/Apiration after swallow Pharyngeal Material enters airway, CONTACTS cords and not ejected out Pharyngeal- Nectar Cup NT Pharyngeal -- Pharyngeal- Nectar Straw NT Pharyngeal -- Pharyngeal- Thin Teaspoon NT Pharyngeal -- Pharyngeal- Thin Cup Delayed swallow initiation-pyriform sinuses;Reduced epiglottic inversion;Reduced anterior laryngeal mobility;Reduced airway/laryngeal closure;Reduced tongue base retraction;Pharyngeal residue - valleculae;Pharyngeal residue -  pyriform;Penetration/Aspiration during swallow;Penetration/Apiration after swallow Pharyngeal Material enters airway, remains ABOVE vocal cords and not ejected out Pharyngeal- Thin Straw Delayed swallow initiation-pyriform sinuses;Reduced epiglottic inversion;Reduced anterior laryngeal mobility;Reduced airway/laryngeal closure;Reduced tongue base retraction;Pharyngeal residue - valleculae;Pharyngeal residue - pyriform;Penetration/Aspiration during swallow;Penetration/Apiration after swallow Pharyngeal Material enters airway, CONTACTS cords and not ejected out Pharyngeal- Puree Delayed swallow initiation-pyriform sinuses;Reduced epiglottic inversion;Reduced anterior laryngeal mobility;Reduced airway/laryngeal closure;Reduced tongue base retraction;Pharyngeal residue - valleculae;Pharyngeal residue - pyriform Pharyngeal Material does not enter airway Pharyngeal- Mechanical Soft NT Pharyngeal -- Pharyngeal- Regular NT Pharyngeal -- Pharyngeal- Multi-consistency -- Pharyngeal -- Pharyngeal- Pill NT Pharyngeal -- Pharyngeal Comment --  CHL IP CERVICAL ESOPHAGEAL PHASE 04/15/2020 Cervical Esophageal Phase Impaired Pudding Teaspoon -- Pudding Cup -- Honey Teaspoon -- Honey Cup -- Nectar Teaspoon -- Nectar Cup -- Nectar Straw -- Thin Teaspoon -- Thin Cup -- Thin Straw -- Puree -- Mechanical Soft -- Regular -- Multi-consistency -- Pill -- Cervical Esophageal Comment decreased UES relaxation Leah McCoy MA, CCC-SLP McCoy Leah Meryl 04/15/2020, 10:43 AM              ECHOCARDIOGRAM COMPLETE  Result Date: 04/04/2020    ECHOCARDIOGRAM REPORT   Patient Name:   ASMAR BROZEK Date of Exam: 04/04/2020 Medical Rec #:  [id number]    Height:       65.0 in Accession #:    [id number]   Weight:       168.2 lb Date of Birth:  1945/04/21    BSA:          1.838 m Patient Age:    32 years     BP:           169/105 mmHg Patient Gender: M            HR:           58 bpm. Exam Location:  Inpatient Procedure: 2D Echo, Cardiac Doppler and Color Doppler  Indications:    Stroke 434.91 / I163.9  History:        Patient has no prior history of Echocardiogram examinations.  Sonographer:    Tiffany Dance Referring Phys: [id number] [place]  1. Left ventricular ejection fraction, by estimation, is 60 to 65%. The left ventricle has normal function. The left ventricle has no regional wall motion abnormalities. Left ventricular diastolic parameters are consistent with Grade I diastolic dysfunction (impaired relaxation).  2. Right ventricular systolic function is normal. The right ventricular size is normal.  3. The mitral valve is normal in structure. No evidence of mitral valve regurgitation. No evidence of mitral stenosis.  4. The aortic valve is normal in structure. Aortic valve regurgitation is not visualized. No aortic stenosis is present.  5. The inferior vena cava is normal in size with greater than 50% respiratory variability, suggesting right atrial pressure of 3 mmHg. Conclusion(s)/Recommendation(s): No intracardiac source of embolism detected on this transthoracic study. A transesophageal echocardiogram is recommended to exclude cardiac source of embolism if clinically indicated. FINDINGS  Left Ventricle: Left ventricular ejection fraction, by estimation, is 60 to 65%. The left ventricle has normal function. The left ventricle has no regional wall motion abnormalities. The left ventricular internal cavity size was  normal in size. There is  no left ventricular hypertrophy. Left ventricular diastolic parameters are consistent with Grade I diastolic dysfunction (impaired relaxation). Right Ventricle: The right ventricular size is normal. No increase in right ventricular wall thickness. Right ventricular systolic function is normal. Left Atrium: Left atrial size was normal in size. Right Atrium: Right atrial size was normal in size. Pericardium: There is no evidence of pericardial effusion. Mitral Valve: The mitral valve is normal in structure.  No evidence of mitral valve regurgitation. No evidence of mitral valve stenosis. Tricuspid Valve: The tricuspid valve is normal in structure. Tricuspid valve regurgitation is not demonstrated. No evidence of tricuspid stenosis. Aortic Valve: The aortic valve is normal in structure. Aortic valve regurgitation is not visualized. No aortic stenosis is present. Pulmonic Valve: The pulmonic valve was normal in structure. Pulmonic valve regurgitation is not visualized. No evidence of pulmonic stenosis. Aorta: The aortic root is normal in size and structure. Venous: The inferior vena cava is normal in size with greater than 50% respiratory variability, suggesting right atrial pressure of 3 mmHg. IAS/Shunts: No atrial level shunt detected by color flow Doppler.  LEFT VENTRICLE PLAX 2D LVIDd:         4.90 cm  Diastology LVIDs:         3.40 cm  LV e' medial:    10.00 cm/s LV PW:         1.10 cm  LV E/e' medial:  11.3 LV IVS:        1.00 cm  LV e' lateral:   10.30 cm/s LVOT diam:     2.30 cm  LV E/e' lateral: 11.0 LV SV:         120 LV SV Index:   66 LVOT Area:     4.15 cm  RIGHT VENTRICLE             IVC RV Basal diam:  2.30 cm     IVC diam: 1.80 cm RV S prime:     11.40 cm/s TAPSE (M-mode): 2.3 cm LEFT ATRIUM             Index       RIGHT ATRIUM           Index LA diam:        4.40 cm 2.39 cm/m  RA Area:     14.30 cm LA Vol (A2C):   50.8 ml 27.64 ml/m RA Volume:   31.00 ml  16.87 ml/m LA Vol (A4C):   43.6 ml 23.72 ml/m LA Biplane Vol: 47.4 ml 25.79 ml/m  AORTIC VALVE LVOT Vmax:   126.00 cm/s LVOT Vmean:  82.800 cm/s LVOT VTI:    0.290 m  AORTA Ao Root diam: 3.60 cm Ao Asc diam:  3.60 cm MITRAL VALVE MV Area (PHT): 2.73 cm     SHUNTS MV Decel Time: 278 msec     Systemic VTI:  0.29 m MV E velocity: 113.00 cm/s  Systemic Diam: 2.30 cm MV A velocity: 125.00 cm/s MV E/A ratio:  0.90 Candee Furbish MD Electronically signed by Candee Furbish MD Signature Date/Time: 04/04/2020/3:39:13 PM    Final    IR PERCUTANEOUS ART  THROMBECTOMY/INFUSION INTRACRANIAL INC DIAG ANGIO  Result Date: 04/03/2020 INDICATION: 75 year old male with past medical history is significant for UTI, urinary retention, recent hematuria and appendectomy presenting with sudden onset left-sided hemiplegia. Left known well at 11:25 a.m. on 04/02/2020. No IV tPA given due to recent hematuria. Premorbid Rankin scale 0; NIHSS 15. Head CT  showed early ischemic changes in the frontoparietal and insular cortex (aspects 6) and CT angiogram of the head and neck showed a distal right M1/MCA a with poor collaterals. He was transferred to our service for a mechanical thrombectomy. EXAM: Diagnostic cerebral angiogram Mechanical thrombectomy Flat panel head CT COMPARISON:  CT/CT angiogram of head and neck April 02, 2020. MEDICATIONS: 10 mg of verapamil intra arterial ANESTHESIA/SEDATION: The procedure was performed in the general anesthesia. CONTRAST:  63m of Omnipaque 300 FLUOROSCOPY TIME:  Fluoroscopy Time:  minutes  seconds ( mGy). COMPLICATIONS: SIR Level A - No therapy, no consequence. TECHNIQUE: Informed written consent was obtained from the patient's daughter after a thorough discussion of the procedural risks, benefits and alternatives. All questions were addressed. Maximal Sterile Barrier Technique was utilized including caps, mask, sterile gowns, sterile gloves, sterile drape, hand hygiene and skin antiseptic. A timeout was performed prior to the initiation of the procedure. The right groin was prepped and draped in the usual sterile fashion. Using a micropuncture kit and the modified Seldinger technique, access was gained to the right common femoral artery and an 8 French sheath was placed. Under fluoroscopy, an 8 [place] balloon guide catheter was navigated over a 6 [place] 2 catheter and a 0.035" Terumo Glidewire into the aortic arch. The catheter was placed into the right common carotid artery and then advanced into the right internal carotid  artery. The inner catheter was removed. Frontal and lateral angiograms of the head were obtained. FINDINGS: 1. Increased tortuosity of the cervical right ICA. 2. Occlusion of the distal right M1/MCA. PROCEDURE: Under biplane roadmap, a zoom 71 aspiration catheter was navigated over a phenom 21 microcatheter and a synchro support microguidewire into the cavernous segment of the right ICA. The microcatheter was then navigated over the wire into the right M1 segment. Then, the aspiration catheter was advanced to the level of occlusion and connected to a penumbra aspiration pump. The guiding catheter balloon was inflated. The aspiration catheter was removed under constant aspiration. 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 Under biplane roadmap, a zoom 71 aspiration catheter was navigated over a phenom 21 microcatheter and a synchro support microguidewire into the cavernous segment of the right ICA. The microcatheter was then navigated over the wire into the right M1 segment. Then, the aspiration catheter was advanced to the level of occlusion and connected to a penumbra aspiration pump. The guiding catheter balloon was inflated. The aspiration catheter was removed under constant aspiration. 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 Under biplane roadmap, a zoom 71 aspiration catheter was navigated over a phenom 21 microcatheter and a synchro support microguidewire into the cavernous segment of the right ICA. The microcatheter was then navigated over the wire into the right M2/MCA middle division branch. Then, a 6 mm embotrap stent retriever was deployed spanning the distal M1 and M2 segment. The device was allowed to intercalated with the clot for 4 minutes. The microcatheter was removed. The aspiration catheter was advanced to the level of  occlusion and connected to a penumbra aspiration pump. The guiding catheter balloon was inflated. The thrombectomy device and aspiration catheter were removed under constant aspiration. 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 Under biplane roadmap, a zoom 71  aspiration catheter was navigated over a phenom 21 microcatheter and a synchro support microguidewire into the cavernous segment of the right ICA. The microcatheter was then navigated over the wire into the right M2/MCA middle division branch. Then, a 5 x 37 mm embotrap stent retriever was deployed spanning the distal M1 and M2 segment. The device was allowed to intercalated with the clot for 4 minutes. The microcatheter was removed. The aspiration catheter was advanced to the level of occlusion and connected to a penumbra aspiration pump. The guiding catheter balloon was inflated. The thrombectomy device and aspiration catheter were removed under constant aspiration. 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 Delayed follow-up showed reocclusion. Under biplane roadmap, a zoom 71 aspiration catheter was navigated over a phenom 21 microcatheter and a synchro support microguidewire into the cavernous segment of the right ICA. The microcatheter was then navigated over the wire into the right M2/MCA middle division branch. Then, a 5 x 37 mm embotrap stent retriever was deployed spanning the distal M1 and M2 segment. The device was allowed to intercalated with the clot for 4 minutes. The microcatheter was removed. The aspiration catheter was advanced to the level of occlusion and connected to a penumbra aspiration pump. The guiding catheter balloon was inflated. The thrombectomy  device and aspiration catheter were removed under constant aspiration. 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 Delayed follow-up showed reocclusion. Under biplane roadmap, a zoom 71 aspiration catheter was navigated over a phenom 21 microcatheter and a synchro support microguidewire into the cavernous segment of the right ICA. The microcatheter was then navigated over the wire into the right M2/MCA posterior division branch. Then, a 5 x 37 mm embotrap stent retriever was deployed spanning the distal M1 and M2 segment. The device was allowed to intercalated with the clot for 4 minutes. The microcatheter was removed. The aspiration catheter was advanced to the level of occlusion and connected to a penumbra aspiration pump. The guiding catheter balloon was inflated. The thrombectomy device and aspiration catheter were removed under constant aspiration. 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 Delayed follow-up showed reocclusion. Flat panel CT of the head was obtained and post processed in a separate workstation with concurrent attending physician supervision. Selected images were sent to PACS. No evidence of complication. Under biplane roadmap, a zoom 71 aspiration catheter was navigated over a phenom 21 microcatheter and a synchro support microguidewire into the cavernous segment of the right ICA. The microcatheter was then navigated over the wire into the right M2/MCA posterior division branch. Then, a tiger 7 stent retriever was deployed spanning the distal M1 and M2 segment. The device was allowed to intercalated with the clot for 4 minutes. The microcatheter was removed. The aspiration catheter was advanced to the level of  occlusion and connected to a penumbra aspiration pump. The guiding catheter balloon was inflated. The thrombectomy device and aspiration catheter were removed under constant aspiration. 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 Delayed follow-up showed reocclusion. Under biplane roadmap, a zoom 71 aspiration catheter was  navigated over a phenom 21 microcatheter and a synchro support microguidewire into the cavernous segment of the right ICA. The microcatheter was then navigated over the wire into the right M2/MCA posterior division branch. Then, a 4 x 40 mm solitaire stent retriever was deployed spanning the distal M1 and M2 segment. The device was allowed to intercalated with the clot for 4 minutes. The microcatheter was removed. The aspiration catheter was advanced to the level of occlusion and connected to a penumbra aspiration pump. The guiding catheter balloon was inflated. The thrombectomy device and aspiration catheter were removed under constant aspiration. 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 Delayed follow-up showed reocclusion. Under biplane roadmap, a zoom 71 aspiration catheter was navigated over a phenom 21 microcatheter and a synchro support microguidewire into the cavernous segment of the right ICA. The microcatheter was then navigated over the wire into the right M2/MCA middle division branch. Then, a 4 x 40 mm solitaire stent retriever was deployed spanning the distal M1 and M2 segment. The device was allowed to intercalated with the clot for 4 minutes. The microcatheter was removed. The aspiration catheter was advanced to the level of occlusion and connected to a penumbra aspiration pump. The guiding catheter  balloon was inflated. The thrombectomy device and aspiration catheter were removed under constant aspiration. 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 Delayed follow-up showed reocclusion. Flat panel CT of the head was obtained and post processed in a separate workstation with concurrent attending physician supervision. Selected images were sent to PACS. Small contrast extravasation seen into the right sylvian fissure. The catheter construct was subsequently withdrawn. Right common femoral artery angiograms were obtained frontal and lateral views. The femoral sheath was then exchanged over the wire for a Perclose ProGlide which was utilized for access closure. Immediate hemostasis was achieved. IMPRESSION: Unsuccessful mechanical thrombectomy for treatment of a distal right M1/MCA occlusion. Multiple attempts performed with temporary partial recanalization followed by subsequent reocclusion. A total of 8 passes were performed in different branches without recanalization (TICI0). PLAN: Patient was transferred to ICU for continued care. Electronically Signed   By: Pedro Earls M.D.   On: 04/03/2020 13:11   [place] EKG SITE RITE  Result Date: 04/06/2020 If [facility] image not attached, placement could not be confirmed due to current cardiac rhythm.     HISTORY OF PRESENT ILLNESS Mr. Cashton Hosley is a 75 y.o. male with history of recent urologic issues including UTI (01/22), urinary retention with foley cath placed, hematuria (likely traumatic due to self cath) s/p cystoscopy 04/01/20 [facility] Dr[facility] Felipa Eth [facility] showing BPH with recommended surgical treatment. Surgical history includes appendectomy.He had been doing well up until 11:25am when he was noted to develop left hemiplegia. EMS was contacted, and took him to the [facility] ED where stroke workup  was undertaken. He was noted to have left hemiplegia and neglect. NIHSS score 15. He was not given tPA due to the hematuria. Transferred urgently to [facility] for thrombectomy which was aborted after multiple attempts which were unsuccessful.   HOSPITAL COURSE Patient presented to [facility] ED on 2/16 via EMS due to developing Left Hemiplegia and neglect. 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 CTA of the head and neck - showed a  distal right M1/MCA occlusion with poor collaterals. He had cerebral edema with a midline shift of 4 mm. He was not given tPA due to hematuria. He was transferred to [facility] for mechanical thrombectomy. He underwent multiple passes performed with aspiration and different stent retrievers (embotrap, solitaire and tiger). Temporary recanalization achieved multiple times with subsequent reocclusion. Total of 8 passes performed but were unsuccessful. He was then transferred to the NICU. NeuroSurgery was consulted and recommended PICC line, having a Na goal of 145-155 and did not recommend surgery as they felt he would not be a good candidate. He was started on hypertonic saline and did have PICC line placed. Due to dysphagia he need a CorTrak placed to ensure nutrition could be continued. He did pull it out but it was replaced. He had waxing and waining consciousness during the first week of his hospitalization. He was started on Depakote for headache but this was stopped on 2/28 due to its possible contribution to his somnolence/encephalopathy. He had no further complaints of headache and started to be more alert and interactive during the day and complaining of not being able to eat "real" food. Due to his poor PO intake and need to get him to Rehab began discussion about PEG tube placement. On 3/7 Surgery evaluated him and on 3/8 PEG tube was placed. He tolerated the procedure well and had no complications from it. He was now able to be discharged to SNF. Family choose  a facility and he was discharged on 3/11.  Due to Hyperlipidemia he was started on Lipitor. He tolerated the medication well with no side effects.  Had issues with Blood pressure control during his hospitalization. He was initially restarted on his home lisinopril but this was stopped due to his AKI. He was started on Amlodipine and his blood pressure normalized. This was then stopped due to his normalized pressures.  Had Electrolyte abnormalities throughout his hospitalizations which were successfully repleted as needed.  Had issues with Urinary Retention prior to hospitalizations and was admitted with a foley catheter. During his admission he did have a light fever, increased white count, and AKI. Output from foley was clear yellow the entire time. Blood cultures only revealed contaminants. He continued to be monitored but his issues resolved.  On the day of discharge his foley was replaced, he was afebrile, and his WBC was 10.5 (WNL).  On 3/2 he began to complain of Right leg pain. X-ray showed punctate bony density noted adjacent to the distal tip of the lateral malleolus. Ortho was consulted and Ortho PA recommended Cam boot, WBAT, and follow up in 2 weeks. No formal consult was required. After getting his boot and Tylenol he had no further complaints of pain.  He developed intractable hiccups during his hospitalization. However, with the initiation of Thorazine and Baclofen these resolved.  RN Pressure Injury Documentation:     DISCHARGE EXAM Blood pressure 114/71, pulse 95, temperature 98.2 F (36.8 C), temperature source Oral, resp. rate 18, height '5\' 5"'  (1.651 m), weight 76.3 kg, SpO2 99 %.  General: alert and awake, elderly Hispanic male, no apparent distress  Lungs: Symmetrical Chest rise, no labored breathing  Cardio: Regular Rate and Rhythm  Abdomen: Soft, non-tender  Neuro:Alert and awake. Answers questions appropriately, mild dysarthria present, fluent speech, Right Gaze  unable to cross midline Left facial droop Strength: Left Upper and Lower extremities-unable to lift, withdraws to noxious stimuli Right Upper and Lower Extremities: 4/5   Discharge Diet  Diet   DIET - DYS 1 Room service appropriate? Yes; Fluid consistency: Nectar Thick   liquids  DISCHARGE PLAN  Disposition:  Discharge to SNF  aspirin 325 mg daily for secondary stroke prevention.  Ongoing stroke risk factor control by Primary Care Physician at time of discharge  Follow-up PCP Patient, No Pcp Per in 1 weeks.  Follow-up in [facility] in 4 weeks, office to schedule an appointment.   35 minutes were spent preparing discharge.  Fatima Sanger MD Resident    Attending Neurohospitalist Addendum Patient seen and examined with APP/Resident. Agree with the history and physical as documented above. Agree with the plan as documented, which I helped formulate. I have independently reviewed the chart, obtained history, review of systems and examined the patient.I have personally reviewed pertinent head/neck/spine imaging (CT/MRI). Please feel free to call with any questions.  -- Amie Portland, MD Stroke Neurology Pager: [phone]

## 2020-04-25 NOTE — Progress Notes (Signed)
Attempted to call report to [facility], several times. Call transferred to nurses station or nurse for Room 312A but no answer. Will continue to attempt.

## 2020-05-01 DIAGNOSIS — N400 Enlarged prostate without lower urinary tract symptoms: Secondary | ICD-10-CM | POA: Diagnosis not present

## 2020-05-01 DIAGNOSIS — K5900 Constipation, unspecified: Secondary | ICD-10-CM | POA: Diagnosis not present

## 2020-05-01 DIAGNOSIS — I63511 Cerebral infarction due to unspecified occlusion or stenosis of right middle cerebral artery: Secondary | ICD-10-CM | POA: Diagnosis not present

## 2020-05-01 DIAGNOSIS — R339 Retention of urine, unspecified: Secondary | ICD-10-CM | POA: Diagnosis not present

## 2020-05-01 DIAGNOSIS — R1310 Dysphagia, unspecified: Secondary | ICD-10-CM | POA: Diagnosis not present

## 2020-05-01 DIAGNOSIS — R5381 Other malaise: Secondary | ICD-10-CM | POA: Diagnosis not present

## 2020-05-01 DIAGNOSIS — E785 Hyperlipidemia, unspecified: Secondary | ICD-10-CM | POA: Diagnosis not present

## 2020-05-01 DIAGNOSIS — T17908D Unspecified foreign body in respiratory tract, part unspecified causing other injury, subsequent encounter: Secondary | ICD-10-CM | POA: Diagnosis not present

## 2020-05-29 ENCOUNTER — Inpatient Hospital Stay: Payer: Medicare HMO | Admitting: Adult Health

## 2020-06-05 DIAGNOSIS — T17908D Unspecified foreign body in respiratory tract, part unspecified causing other injury, subsequent encounter: Secondary | ICD-10-CM | POA: Diagnosis not present

## 2020-06-05 DIAGNOSIS — E785 Hyperlipidemia, unspecified: Secondary | ICD-10-CM | POA: Diagnosis not present

## 2020-06-05 DIAGNOSIS — K5900 Constipation, unspecified: Secondary | ICD-10-CM | POA: Diagnosis not present

## 2020-06-05 DIAGNOSIS — R1310 Dysphagia, unspecified: Secondary | ICD-10-CM | POA: Diagnosis not present

## 2020-06-05 DIAGNOSIS — R339 Retention of urine, unspecified: Secondary | ICD-10-CM | POA: Diagnosis not present

## 2020-06-05 DIAGNOSIS — R5381 Other malaise: Secondary | ICD-10-CM | POA: Diagnosis not present

## 2020-06-05 DIAGNOSIS — I63511 Cerebral infarction due to unspecified occlusion or stenosis of right middle cerebral artery: Secondary | ICD-10-CM | POA: Diagnosis not present

## 2020-06-05 DIAGNOSIS — N400 Enlarged prostate without lower urinary tract symptoms: Secondary | ICD-10-CM | POA: Diagnosis not present

## 2020-06-09 ENCOUNTER — Ambulatory Visit (HOSPITAL_COMMUNITY)
Admission: RE | Admit: 2020-06-09 | Discharge: 2020-06-09 | Disposition: A | Discharge status: Home / Self Care | Payer: Medicare HMO | Source: Ambulatory Visit | Attending: Student | Admitting: Student

## 2020-06-09 ENCOUNTER — Other Ambulatory Visit: Payer: Self-pay

## 2020-06-09 DIAGNOSIS — I639 Cerebral infarction, unspecified: Secondary | ICD-10-CM

## 2020-06-09 NOTE — Progress Notes (Signed)
Referring Physician(s): Louk,Alexandra M  Chief Complaint: The patient is seen in follow up today s/p mechanical thrombectomy for right M1/MCA occlusion.  History of present illness: 75 year old male with past medical history significant for UTI, urinary retention, hematuria and BPH.  He presented to the hospital with left hemiplegia and neglect on 04/02/2020, NIHSS 15.  Head CT showed a large infarct (ASPECTS 6).  CT angiogram showed a distal right M1/MCA occlusion.  He underwent mechanical thrombectomy.  However, no recanalization achieved at their with multiple passes performed with different devices and techniques.  Follow-up CT showed a complete right MCA territory infarct.   No past medical history on file.  Past Surgical History:  Procedure Laterality Date  . ESOPHAGOGASTRODUODENOSCOPY (EGD) WITH PROPOFOL N/A 04/22/2020   Procedure: ESOPHAGOGASTRODUODENOSCOPY (EGD) WITH PROPOFOL;  Surgeon: Violeta Gelinas, MD;  Location: [facility] ENDOSCOPY;  Service: General;  Laterality: N/A;  . IR CT HEAD LTD  04/03/2020  . IR CT HEAD LTD  04/03/2020  . IR PERCUTANEOUS ART THROMBECTOMY/INFUSION INTRACRANIAL INC DIAG ANGIO  04/03/2020  . PEG PLACEMENT N/A 04/22/2020   Procedure: PERCUTANEOUS ENDOSCOPIC GASTROSTOMY (PEG) PLACEMENT;  Surgeon: Violeta Gelinas, MD;  Location: [facility] ENDOSCOPY;  Service: General;  Laterality: N/A;  . RADIOLOGY WITH ANESTHESIA N/A 04/02/2020   Procedure: IR WITH ANESTHESIA;  Surgeon: Baldemar Lenis, MD;  Location: [facility] OR;  Service: Radiology;  Laterality: N/A;    Allergies: Patient has no known allergies.  Medications: Prior to Admission medications   Medication Sig Start Date End Date Taking? Authorizing Provider  alfuzosin (UROXATRAL) 10 MG 24 hr tablet Take 10 mg by mouth daily. 03/13/20   [provider]  aspirin 325 MG tablet Place 1 tablet (325 mg total) into feeding tube daily. 04/26/20   Lauro Franklin, MD  atorvastatin (LIPITOR) 40 MG tablet  Place 1 tablet (40 mg total) into feeding tube daily. 04/26/20   Lauro Franklin, MD  baclofen 5 MG TABS Place 5 mg into feeding tube 3 (three) times daily. 04/25/20   Lauro Franklin, MD  chlorproMAZINE (THORAZINE) 25 MG tablet Place 0.5 tablets (12.5 mg total) into feeding tube 3 (three) times daily. 04/25/20   Lauro Franklin, MD  polyethylene glycol (MIRALAX / GLYCOLAX) 17 g packet Place 17 g into feeding tube daily. 04/26/20   Lauro Franklin, MD  senna-docusate (SENOKOT-S) 8.6-50 MG tablet Place 2 tablets into feeding tube 2 (two) times daily. 04/25/20   Lauro Franklin, MD     No family history on file.  Social History   Socioeconomic History  . Marital status: Unknown    Spouse name: Not on file  . Number of children: Not on file  . Years of education: Not on file  . Highest education level: Not on file  Occupational History  . Not on file  Tobacco Use  . Smoking status: Never Smoker  . Smokeless tobacco: Never Used  Substance and Sexual Activity  . Alcohol use: Not on file  . Drug use: Not on file  . Sexual activity: Not on file  Other Topics Concern  . Not on file  Social History Narrative  . Not on file   Social Determinants of Health   Financial Resource Strain: Not on file  Food Insecurity: Not on file  Transportation Needs: Not on file  Physical Activity: Not on file  Stress: Not on file  Social Connections: Not on file     Vital Signs: BP: 128/73 mmHG, right arm.  Physical Exam Cardiovascular:     Rate and Rhythm: Normal rate and regular rhythm.     Heart sounds: Normal heart sounds.  Pulmonary:     Breath sounds: Normal breath sounds.  Skin:      Neurological:     Mental Status: He is alert.     Motor: Weakness present.     Comments: Left upper and lower extremity plegia.     Imaging: No results found.  Labs:  CBC: Recent Labs    04/21/20 0345 04/22/20 0340 04/23/20 0441 04/25/20 1101  WBC 8.3 9.4 11.2*  10.5  HGB 13.4 13.7 13.6 13.2  HCT 40.1 41.0 39.9 39.0  PLT 494* 517* 492* 414*    COAGS: Recent Labs    04/02/20 2208  INR 1.0  APTT 29    BMP: Recent Labs    04/21/20 0345 04/22/20 0340 04/23/20 0441 04/25/20 1101  NA 131* 132* 132* 130*  K 4.2 4.0 4.2 4.5  CL 97* 97* 99 98  CO2 22 24 24  21*  GLUCOSE 152* 90 140* 147*  BUN 24* 26* 26* 21  CALCIUM 9.2 9.9 9.2 9.1  CREATININE 0.78 0.81 0.80 0.82  GFRNONAA >60 >60 >60 >60    LIVER FUNCTION TESTS: Recent Labs    04/02/20 2208 04/09/20 0423  BILITOT 0.8 0.9  AST 29 90*  ALT 18 160*  ALKPHOS 86 101  PROT 6.5 6.2*  ALBUMIN 3.4* 2.9*    Assessment: 75 year old male status post unsuccessful mechanical thrombectomy.  He comes to this visit accompanied by his daughter 61.  Consultation performed with the assistance of a Spanish interpreter, [name]).  The access site is clean and dry with normal femoral pulse.  Procedure was explained to the patient and all questions were answered.  No further follow-up with neuro interventional radiology planned at this time.   Signed: Mattie Marlin, MD 06/09/2020, 11:55 AM    I spent a total of 25 Minutes in face to face in clinical consultation, greater than 50% of which was counseling/coordinating care for right MCA stroke.

## 2020-06-10 ENCOUNTER — Encounter: Payer: Self-pay | Admitting: Adult Health

## 2020-06-10 ENCOUNTER — Ambulatory Visit: Payer: Medicare HMO | Admitting: Adult Health

## 2020-06-10 VITALS — BP 137/77 | HR 120

## 2020-06-10 DIAGNOSIS — I63411 Cerebral infarction due to embolism of right middle cerebral artery: Secondary | ICD-10-CM | POA: Diagnosis not present

## 2020-06-10 DIAGNOSIS — G8194 Hemiplegia, unspecified affecting left nondominant side: Secondary | ICD-10-CM

## 2020-06-10 NOTE — Progress Notes (Signed)
[facility] [address] [place] [id number] [phone]       HOSPITAL FOLLOW UP NOTE  [name] Date of Birth:  [birth year] Medical Record Number:  [id number]   Reason for Referral:  hospital stroke follow up    SUBJECTIVE:   CHIEF COMPLAINT:  Chief Complaint  Patient presents with  . Follow-up    RM 9214 with daughter (patricia) (& [facility] interpreter) PT is having tingling , numbness on L side, blurred vision and feels swollen.     HPI:   Gregory Osborn a 75 y.o.malewith a history of recent urologic issues including UTI (01/22), urinary retention with foley cath placed, hematuria (likely traumatic due to self cath) s/p cyst[facility] 04/01/20 [facility] showing BPH with recommended surgical treatment. Surgical history includes appendectomy. He had been doing well up until 11:25am on 2/16 when he was noted to develop left hemiplegia. EMS was contacted, and [facility] ED where stroke workup was undertaken. He was noted to have left hemiplegia and neglect with NIHSS score 15.  [facility].  Personally reviewed hospitalization pertinent progress notes, lab work and imaging with summary provided.  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  Repeat CT head 2/19 showed worsening shift and edema with MLS 12mm and 2/23 right MCA large infarct with MLS 14mm.  Evaluated by neurosurgery but patient deemed not to be a surgical candidate.  Recommend consideration of 30-day cardiac event monitor outpatient to rule out A. fib.  Initiated aspirin 325 mg daily for secondary stroke prevention.  LDL 118 -started atorvastatin 40 mg daily.  Other stroke risk factors include EtOH use but no prior stroke history.  PEG tube placed 3/8 in setting of dysphagia and poor p.o. intake.  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   MRI showed large right MCA infarct involving entire right MCA, consistent with malignant right MCA syndrome. Small right SAH likely due to hemorrhagic conversion.   MRA head showed right M1 occluded.   CT repeat 2/23 right MCA large infarct with MLS 14mm  2D Echo EF 60-65%, No shunt   May consider 30 day cardiac event monitoring as outpt to rule out afib  LDL 118  HgbA1c 5.6  VTE prophylaxis - heparin subq  No antithrombotics PTA, now on ASA 325mg   Therapy recommendations:  SNF  Disposition:  SNF 3/11   Today, 06/10/2020, [name] is being seen for hospital follow-up accompanied by his daughter and [facility] interpreter.  [facility] continues to reside at [facility] Reports residual left-sided weakness, left-sided numbness/tingling, blurred vision and dysphagia.  Does report mild speech difficulty but overall improved.  Currently receiving PT but per daughter and patient, has not received any OT or SLP.  He questions removal of PEG tube as he is eating a modified diet without difficulty and taking pills whole by mouth - PEG tube not currently being used.  He remains nonambulatory and uses Hoyer lift for transfers.  Daughter questions reason why he needs to [facility] as she was initially told he would likely be there for  2 months and as he is only receiving PT, she questions benefit.  Denies new or worsening stroke/TIA symptoms  Per review of facility MAR, remains on aspirin 325 mg daily and atorvastatin 40 mg daily without associated side effects Blood pressure today 137/77 - unknown levels at facility but per [facility], not currently receiving antihypertensives  Foley catheter remains  in place with clear yellow urine draining -continues to follow with urology No further concerns at this time      ROS:   14 system review of systems performed and negative with exception of those listed in HPI  PMH: History reviewed. No pertinent past medical history.  PSH:  Past Surgical History:  Procedure Laterality Date  . ESOPHAGOGASTRODUODENOSCOPY (EGD) WITH PROPOFOL N/A 04/22/2020   Procedure: ESOPHAGOGASTRODUODENOSCOPY (EGD) WITH PROPOFOL;  Surgeon: Violeta Gelinas, MD;  Location: [facility] ENDOSCOPY;  Service: General;  Laterality: N/A;  . IR CT HEAD LTD  04/03/2020  . IR CT HEAD LTD  04/03/2020  . IR PERCUTANEOUS ART THROMBECTOMY/INFUSION INTRACRANIAL INC DIAG ANGIO  04/03/2020  . PEG PLACEMENT N/A 04/22/2020   Procedure: PERCUTANEOUS ENDOSCOPIC GASTROSTOMY (PEG) PLACEMENT;  Surgeon: Violeta Gelinas, MD;  Location: [facility] ENDOSCOPY;  Service: General;  Laterality: N/A;  . RADIOLOGY WITH ANESTHESIA N/A 04/02/2020   Procedure: IR WITH ANESTHESIA;  Surgeon: Baldemar Lenis, MD;  Location: [facility] OR;  Service: Radiology;  Laterality: N/A;    Social History:  Social History   Socioeconomic History  . Marital status: Unknown    Spouse name: Not on file  . Number of children: Not on file  . Years of education: Not on file  . Highest education level: Not on file  Occupational History  . Not on file  Tobacco Use  . Smoking status: Never Smoker  . Smokeless tobacco: Never Used  Substance and Sexual Activity  . Alcohol use: Not on file  . Drug use: Not on file  . Sexual activity: Not on file  Other Topics Concern  . Not on file  Social History Narrative  . Not on file   Social Determinants of Health   Financial Resource Strain: Not on file  Food Insecurity: Not on file  Transportation Needs: Not on file  Physical Activity: Not on file  Stress: Not on file  Social Connections: Not on file  Intimate Partner Violence: Not on file    Family History: History reviewed. No  pertinent family history.  Medications:   Current Outpatient Medications on File Prior to Visit  Medication Sig Dispense Refill  . aspirin 325 MG tablet Place 1 tablet (325 mg total) into feeding tube daily.    Marland Kitchen atorvastatin (LIPITOR) 40 MG tablet Place 1 tablet (40 mg total) into feeding tube daily.    . baclofen 5 MG TABS Place 5 mg into feeding tube 3 (three) times daily. 30 tablet 0  . chlorproMAZINE (THORAZINE) 25 MG tablet Place 0.5 tablets (12.5 mg total) into feeding tube 3 (three) times daily.    . polyethylene glycol (MIRALAX / GLYCOLAX) 17 g packet Place 17 g into feeding tube daily. 14 each 0  . senna-docusate (SENOKOT-S) 8.6-50 MG tablet Place 2 tablets into feeding tube 2 (two) times daily.     No current facility-administered medications on file prior to visit.    Allergies:  No Known Allergies    OBJECTIVE:  Physical Exam  Vitals:   06/10/20 1315  BP: 137/77  Pulse: (!) 120   There is no height or weight on file to  calculate BMI. No exam data present  Post stroke PHQ 2/9 Depression screen PHQ 2/9 06/10/2020  Decreased Interest 0  Down, Depressed, Hopeless 0  PHQ - 2 Score 0     General: Frail pleasant elderly Hispanic male, seated, in no evident distress Head: head normocephalic and atraumatic.   Neck: supple with no carotid or supraclavicular bruits Cardiovascular: regular rate and rhythm, no murmurs Musculoskeletal: no deformity Skin:  no rash/petichiae; PEG tube intact Vascular:  Normal pulses all extremities   Neurologic Exam Mental Status: Awake and fully alert.   Denies dysarthria or aphasia - unable to fully assess as he is Spanish-speaking -interpreter does note occasional difficulty understanding.  Recent and remote memory impaired. Attention span, concentration and fund of knowledge slightly impaired. Mood and affect appropriate.  Cranial Nerves: Fundoscopic exam reveals sharp disc margins. Pupils equal, briskly reactive to light. Extraocular  movements full without nystagmus although right gaze preference but able to cross midline.  Left sided inattention. Visual fields no blink to threat on right side -blinks to threat, . Hearing intact. Facial sensation intact.  Left lower facial paralysis.  Tongue and palate moves normally and symmetrically. Motor: Normal strength, bulk and tone on right side.  Dense left hemiplegia Sensory.: reports he is able to feel on left side but with light touch, notes increased numbness sensation Coordination: Rapid alternating movements normal on right side. Finger-to-nose and heel-to-shin performed accurately on right side Gait and Station: Deferred as patient nonambulatory Reflexes: 2+ LUE and LLE; 1+ RUE and RLE. Toes downgoing.     NIHSS  15 Modified Rankin  4-5      ASSESSMENT: Gregory Osborn is a 75 y.o. 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  Midline shift noted with increased shift and edema 2/19 and 2/23 with MLS 42mm.  Vascular risk factors include HTN, HLD, advanced age and EtOH use.      PLAN:  1. R MCA stroke :  a. Significant residual deficits including left hemiplegia with neglect, dysphagia, right peripheral impairment and sensory impairment.  Encourage continued participation with therapies at SNF but did advise daughter that due to the size of his stroke, he will likely have limited further recovery - advised that daughter will need to speak with therapists and SW at facility in regards to patient returning home - this would likely be extremely difficult as he is completely dependent with all care but again, this can be further discussed at facility b. Requested recent progress notes from therapy,  nurses and providers at facility for further review - would not recommend removal of PEG tube until we are able to verify that tube is no longer being used [facility] shows med route via G-tube) and that he is maintaining adequate nutrition. Can consider referral to general surgery at follow-up visit c. May consider further cardiac monitoring in the future as recommended at discharge to rule out A. Fib but will hold off right now in setting of his current functional condition. He would also need to be cleared by urology to be possible AC candidate in setting of prior hematuria d. Continue aspirin 325 mg daily  and atorvastatin for secondary stroke prevention.  e.  Discussed secondary stroke prevention measures and importance of close PCP follow up for aggressive stroke  risk factor management  2. HTN: BP goal <130/90.  Stable today on nonpharmacological management per facility 3. HLD: LDL goal <70. Recent LDL 118 -currently on atorvastatin 40 mg daily.  Request repeat level through facility as patient not currently fasting as well as ongoing prescribing and management    Follow up in 3 months or call earlier if needed   CC:  [facility] provider: Dr. Pearlean Brownie PCP: Charlott Rakes, MD    I spent 60 minutes of face-to-face and non-face-to-face time with patient and daughter assisted by interpreter.  This included previsit chart review including recent hospitalization pertinent progress notes, lab work and imaging, lab review, study review, order entry, electronic health record documentation, patient education regarding recent stroke and possible etiologies, residual deficits and possibility of returning home, importance of managing stroke risk factors and answered all other multiple questions to patient and daughters satisfaction  Ihor Austin, AGNP-BC  [facility] [address] [place], [place] [address]  Phone [phone] Fax [phone] Note: This document was prepared  with digital dictation and possible smart phrase technology. Any transcriptional errors that result from this process are unintentional.

## 2020-06-10 NOTE — Patient Instructions (Addendum)
Residual deficits: Left hemiplegia, left-sided neglect, right peripheral visual impairment, and dysphagia Continue participation with physical therapy - ensure participation with occupational and speech therapy if appropriate If PEG tube remains on use over the next 1 to 2 months, can consider removal (per daughter, PEG tube not being used) Daughter questions ongoing need of patient staying at facility -she was advised to further speak with facility therapy and social work to discuss this further Continue aspirin and atorvastatin for secondary stroke prevention Request recent progress notes from both nurse and provider be faxed to office for further review  Follow-up in 3 months or call earlier if needed

## 2020-06-12 DIAGNOSIS — N400 Enlarged prostate without lower urinary tract symptoms: Secondary | ICD-10-CM | POA: Diagnosis not present

## 2020-06-12 DIAGNOSIS — R5381 Other malaise: Secondary | ICD-10-CM | POA: Diagnosis not present

## 2020-06-12 DIAGNOSIS — I63511 Cerebral infarction due to unspecified occlusion or stenosis of right middle cerebral artery: Secondary | ICD-10-CM | POA: Diagnosis not present

## 2020-06-12 DIAGNOSIS — T17908D Unspecified foreign body in respiratory tract, part unspecified causing other injury, subsequent encounter: Secondary | ICD-10-CM | POA: Diagnosis not present

## 2020-06-12 DIAGNOSIS — E785 Hyperlipidemia, unspecified: Secondary | ICD-10-CM | POA: Diagnosis not present

## 2020-06-12 DIAGNOSIS — R1310 Dysphagia, unspecified: Secondary | ICD-10-CM | POA: Diagnosis not present

## 2020-06-12 DIAGNOSIS — K5900 Constipation, unspecified: Secondary | ICD-10-CM | POA: Diagnosis not present

## 2020-06-12 DIAGNOSIS — R339 Retention of urine, unspecified: Secondary | ICD-10-CM | POA: Diagnosis not present

## 2020-06-12 NOTE — Progress Notes (Signed)
I agree with the above plan 

## 2020-06-17 DIAGNOSIS — R339 Retention of urine, unspecified: Secondary | ICD-10-CM | POA: Diagnosis not present

## 2020-06-17 DIAGNOSIS — N401 Enlarged prostate with lower urinary tract symptoms: Secondary | ICD-10-CM | POA: Diagnosis not present

## 2020-06-17 DIAGNOSIS — N138 Other obstructive and reflux uropathy: Secondary | ICD-10-CM | POA: Diagnosis not present

## 2020-06-17 DIAGNOSIS — R338 Other retention of urine: Secondary | ICD-10-CM | POA: Diagnosis not present

## 2020-06-19 DIAGNOSIS — R2681 Unsteadiness on feet: Secondary | ICD-10-CM | POA: Diagnosis not present

## 2020-06-19 DIAGNOSIS — Z741 Need for assistance with personal care: Secondary | ICD-10-CM | POA: Diagnosis not present

## 2020-06-19 DIAGNOSIS — M6281 Muscle weakness (generalized): Secondary | ICD-10-CM | POA: Diagnosis not present

## 2020-06-19 DIAGNOSIS — I63511 Cerebral infarction due to unspecified occlusion or stenosis of right middle cerebral artery: Secondary | ICD-10-CM | POA: Diagnosis not present

## 2020-06-19 DIAGNOSIS — I69391 Dysphagia following cerebral infarction: Secondary | ICD-10-CM | POA: Diagnosis not present

## 2020-06-19 DIAGNOSIS — R262 Difficulty in walking, not elsewhere classified: Secondary | ICD-10-CM | POA: Diagnosis not present

## 2020-06-19 DIAGNOSIS — R6339 Other feeding difficulties: Secondary | ICD-10-CM | POA: Diagnosis not present

## 2020-06-20 DIAGNOSIS — I63511 Cerebral infarction due to unspecified occlusion or stenosis of right middle cerebral artery: Secondary | ICD-10-CM | POA: Diagnosis not present

## 2020-06-20 DIAGNOSIS — R6339 Other feeding difficulties: Secondary | ICD-10-CM | POA: Diagnosis not present

## 2020-06-20 DIAGNOSIS — R2681 Unsteadiness on feet: Secondary | ICD-10-CM | POA: Diagnosis not present

## 2020-06-20 DIAGNOSIS — I69391 Dysphagia following cerebral infarction: Secondary | ICD-10-CM | POA: Diagnosis not present

## 2020-06-20 DIAGNOSIS — M6281 Muscle weakness (generalized): Secondary | ICD-10-CM | POA: Diagnosis not present

## 2020-06-20 DIAGNOSIS — Z741 Need for assistance with personal care: Secondary | ICD-10-CM | POA: Diagnosis not present

## 2020-06-20 DIAGNOSIS — R262 Difficulty in walking, not elsewhere classified: Secondary | ICD-10-CM | POA: Diagnosis not present

## 2020-06-23 DIAGNOSIS — M6281 Muscle weakness (generalized): Secondary | ICD-10-CM | POA: Diagnosis not present

## 2020-06-23 DIAGNOSIS — R2681 Unsteadiness on feet: Secondary | ICD-10-CM | POA: Diagnosis not present

## 2020-06-23 DIAGNOSIS — I69391 Dysphagia following cerebral infarction: Secondary | ICD-10-CM | POA: Diagnosis not present

## 2020-06-23 DIAGNOSIS — Z741 Need for assistance with personal care: Secondary | ICD-10-CM | POA: Diagnosis not present

## 2020-06-23 DIAGNOSIS — R6339 Other feeding difficulties: Secondary | ICD-10-CM | POA: Diagnosis not present

## 2020-06-23 DIAGNOSIS — R262 Difficulty in walking, not elsewhere classified: Secondary | ICD-10-CM | POA: Diagnosis not present

## 2020-06-23 DIAGNOSIS — I63511 Cerebral infarction due to unspecified occlusion or stenosis of right middle cerebral artery: Secondary | ICD-10-CM | POA: Diagnosis not present

## 2020-06-24 DIAGNOSIS — I63511 Cerebral infarction due to unspecified occlusion or stenosis of right middle cerebral artery: Secondary | ICD-10-CM | POA: Diagnosis not present

## 2020-06-24 DIAGNOSIS — R2681 Unsteadiness on feet: Secondary | ICD-10-CM | POA: Diagnosis not present

## 2020-06-24 DIAGNOSIS — R6339 Other feeding difficulties: Secondary | ICD-10-CM | POA: Diagnosis not present

## 2020-06-24 DIAGNOSIS — M6281 Muscle weakness (generalized): Secondary | ICD-10-CM | POA: Diagnosis not present

## 2020-06-24 DIAGNOSIS — Z741 Need for assistance with personal care: Secondary | ICD-10-CM | POA: Diagnosis not present

## 2020-06-24 DIAGNOSIS — R262 Difficulty in walking, not elsewhere classified: Secondary | ICD-10-CM | POA: Diagnosis not present

## 2020-06-24 DIAGNOSIS — I69391 Dysphagia following cerebral infarction: Secondary | ICD-10-CM | POA: Diagnosis not present

## 2020-06-25 DIAGNOSIS — M6281 Muscle weakness (generalized): Secondary | ICD-10-CM | POA: Diagnosis not present

## 2020-06-25 DIAGNOSIS — R6339 Other feeding difficulties: Secondary | ICD-10-CM | POA: Diagnosis not present

## 2020-06-25 DIAGNOSIS — R2681 Unsteadiness on feet: Secondary | ICD-10-CM | POA: Diagnosis not present

## 2020-06-25 DIAGNOSIS — Z741 Need for assistance with personal care: Secondary | ICD-10-CM | POA: Diagnosis not present

## 2020-06-25 DIAGNOSIS — I69391 Dysphagia following cerebral infarction: Secondary | ICD-10-CM | POA: Diagnosis not present

## 2020-06-25 DIAGNOSIS — I63511 Cerebral infarction due to unspecified occlusion or stenosis of right middle cerebral artery: Secondary | ICD-10-CM | POA: Diagnosis not present

## 2020-06-25 DIAGNOSIS — R262 Difficulty in walking, not elsewhere classified: Secondary | ICD-10-CM | POA: Diagnosis not present

## 2020-06-26 DIAGNOSIS — R6339 Other feeding difficulties: Secondary | ICD-10-CM | POA: Diagnosis not present

## 2020-06-26 DIAGNOSIS — M6281 Muscle weakness (generalized): Secondary | ICD-10-CM | POA: Diagnosis not present

## 2020-06-26 DIAGNOSIS — R262 Difficulty in walking, not elsewhere classified: Secondary | ICD-10-CM | POA: Diagnosis not present

## 2020-06-26 DIAGNOSIS — R2681 Unsteadiness on feet: Secondary | ICD-10-CM | POA: Diagnosis not present

## 2020-06-26 DIAGNOSIS — I69391 Dysphagia following cerebral infarction: Secondary | ICD-10-CM | POA: Diagnosis not present

## 2020-06-26 DIAGNOSIS — Z741 Need for assistance with personal care: Secondary | ICD-10-CM | POA: Diagnosis not present

## 2020-06-26 DIAGNOSIS — I63511 Cerebral infarction due to unspecified occlusion or stenosis of right middle cerebral artery: Secondary | ICD-10-CM | POA: Diagnosis not present

## 2020-06-27 DIAGNOSIS — R6339 Other feeding difficulties: Secondary | ICD-10-CM | POA: Diagnosis not present

## 2020-06-27 DIAGNOSIS — R2681 Unsteadiness on feet: Secondary | ICD-10-CM | POA: Diagnosis not present

## 2020-06-27 DIAGNOSIS — M6281 Muscle weakness (generalized): Secondary | ICD-10-CM | POA: Diagnosis not present

## 2020-06-27 DIAGNOSIS — Z741 Need for assistance with personal care: Secondary | ICD-10-CM | POA: Diagnosis not present

## 2020-06-27 DIAGNOSIS — I63511 Cerebral infarction due to unspecified occlusion or stenosis of right middle cerebral artery: Secondary | ICD-10-CM | POA: Diagnosis not present

## 2020-06-27 DIAGNOSIS — I69391 Dysphagia following cerebral infarction: Secondary | ICD-10-CM | POA: Diagnosis not present

## 2020-06-27 DIAGNOSIS — R262 Difficulty in walking, not elsewhere classified: Secondary | ICD-10-CM | POA: Diagnosis not present

## 2020-06-27 DIAGNOSIS — I69354 Hemiplegia and hemiparesis following cerebral infarction affecting left non-dominant side: Secondary | ICD-10-CM | POA: Diagnosis not present

## 2020-06-30 DIAGNOSIS — R1310 Dysphagia, unspecified: Secondary | ICD-10-CM | POA: Diagnosis not present

## 2020-06-30 DIAGNOSIS — I69354 Hemiplegia and hemiparesis following cerebral infarction affecting left non-dominant side: Secondary | ICD-10-CM | POA: Diagnosis not present

## 2020-06-30 DIAGNOSIS — I10 Essential (primary) hypertension: Secondary | ICD-10-CM | POA: Diagnosis not present

## 2020-06-30 DIAGNOSIS — Z466 Encounter for fitting and adjustment of urinary device: Secondary | ICD-10-CM | POA: Diagnosis not present

## 2020-06-30 DIAGNOSIS — N401 Enlarged prostate with lower urinary tract symptoms: Secondary | ICD-10-CM | POA: Diagnosis not present

## 2020-06-30 DIAGNOSIS — R338 Other retention of urine: Secondary | ICD-10-CM | POA: Diagnosis not present

## 2020-06-30 DIAGNOSIS — Z431 Encounter for attention to gastrostomy: Secondary | ICD-10-CM | POA: Diagnosis not present

## 2020-06-30 DIAGNOSIS — T17908D Unspecified foreign body in respiratory tract, part unspecified causing other injury, subsequent encounter: Secondary | ICD-10-CM | POA: Diagnosis not present

## 2020-06-30 DIAGNOSIS — E785 Hyperlipidemia, unspecified: Secondary | ICD-10-CM | POA: Diagnosis not present

## 2020-07-01 DIAGNOSIS — R1310 Dysphagia, unspecified: Secondary | ICD-10-CM | POA: Diagnosis not present

## 2020-07-01 DIAGNOSIS — T17908D Unspecified foreign body in respiratory tract, part unspecified causing other injury, subsequent encounter: Secondary | ICD-10-CM | POA: Diagnosis not present

## 2020-07-01 DIAGNOSIS — E785 Hyperlipidemia, unspecified: Secondary | ICD-10-CM | POA: Diagnosis not present

## 2020-07-01 DIAGNOSIS — N401 Enlarged prostate with lower urinary tract symptoms: Secondary | ICD-10-CM | POA: Diagnosis not present

## 2020-07-01 DIAGNOSIS — R338 Other retention of urine: Secondary | ICD-10-CM | POA: Diagnosis not present

## 2020-07-01 DIAGNOSIS — I10 Essential (primary) hypertension: Secondary | ICD-10-CM | POA: Diagnosis not present

## 2020-07-01 DIAGNOSIS — Z466 Encounter for fitting and adjustment of urinary device: Secondary | ICD-10-CM | POA: Diagnosis not present

## 2020-07-01 DIAGNOSIS — Z431 Encounter for attention to gastrostomy: Secondary | ICD-10-CM | POA: Diagnosis not present

## 2020-07-01 DIAGNOSIS — I69354 Hemiplegia and hemiparesis following cerebral infarction affecting left non-dominant side: Secondary | ICD-10-CM | POA: Diagnosis not present

## 2020-07-09 DIAGNOSIS — I679 Cerebrovascular disease, unspecified: Secondary | ICD-10-CM | POA: Diagnosis not present

## 2020-07-09 DIAGNOSIS — H53451 Other localized visual field defect, right eye: Secondary | ICD-10-CM | POA: Diagnosis not present

## 2020-07-09 DIAGNOSIS — Z7689 Persons encountering health services in other specified circumstances: Secondary | ICD-10-CM | POA: Diagnosis not present

## 2020-07-09 DIAGNOSIS — G8194 Hemiplegia, unspecified affecting left nondominant side: Secondary | ICD-10-CM | POA: Diagnosis not present

## 2020-07-09 DIAGNOSIS — I10 Essential (primary) hypertension: Secondary | ICD-10-CM | POA: Diagnosis not present

## 2020-07-11 DIAGNOSIS — T17908D Unspecified foreign body in respiratory tract, part unspecified causing other injury, subsequent encounter: Secondary | ICD-10-CM | POA: Diagnosis not present

## 2020-07-11 DIAGNOSIS — Z466 Encounter for fitting and adjustment of urinary device: Secondary | ICD-10-CM | POA: Diagnosis not present

## 2020-07-11 DIAGNOSIS — E785 Hyperlipidemia, unspecified: Secondary | ICD-10-CM | POA: Diagnosis not present

## 2020-07-11 DIAGNOSIS — R1310 Dysphagia, unspecified: Secondary | ICD-10-CM | POA: Diagnosis not present

## 2020-07-11 DIAGNOSIS — Z431 Encounter for attention to gastrostomy: Secondary | ICD-10-CM | POA: Diagnosis not present

## 2020-07-11 DIAGNOSIS — N401 Enlarged prostate with lower urinary tract symptoms: Secondary | ICD-10-CM | POA: Diagnosis not present

## 2020-07-11 DIAGNOSIS — R338 Other retention of urine: Secondary | ICD-10-CM | POA: Diagnosis not present

## 2020-07-11 DIAGNOSIS — I10 Essential (primary) hypertension: Secondary | ICD-10-CM | POA: Diagnosis not present

## 2020-07-11 DIAGNOSIS — I69354 Hemiplegia and hemiparesis following cerebral infarction affecting left non-dominant side: Secondary | ICD-10-CM | POA: Diagnosis not present

## 2020-07-16 DIAGNOSIS — Z466 Encounter for fitting and adjustment of urinary device: Secondary | ICD-10-CM | POA: Diagnosis not present

## 2020-07-16 DIAGNOSIS — N401 Enlarged prostate with lower urinary tract symptoms: Secondary | ICD-10-CM | POA: Diagnosis not present

## 2020-07-16 DIAGNOSIS — Z431 Encounter for attention to gastrostomy: Secondary | ICD-10-CM | POA: Diagnosis not present

## 2020-07-16 DIAGNOSIS — E785 Hyperlipidemia, unspecified: Secondary | ICD-10-CM | POA: Diagnosis not present

## 2020-07-16 DIAGNOSIS — R338 Other retention of urine: Secondary | ICD-10-CM | POA: Diagnosis not present

## 2020-07-16 DIAGNOSIS — T17908D Unspecified foreign body in respiratory tract, part unspecified causing other injury, subsequent encounter: Secondary | ICD-10-CM | POA: Diagnosis not present

## 2020-07-16 DIAGNOSIS — I69354 Hemiplegia and hemiparesis following cerebral infarction affecting left non-dominant side: Secondary | ICD-10-CM | POA: Diagnosis not present

## 2020-07-16 DIAGNOSIS — I10 Essential (primary) hypertension: Secondary | ICD-10-CM | POA: Diagnosis not present

## 2020-07-16 DIAGNOSIS — R1310 Dysphagia, unspecified: Secondary | ICD-10-CM | POA: Diagnosis not present

## 2020-07-21 DIAGNOSIS — Z431 Encounter for attention to gastrostomy: Secondary | ICD-10-CM | POA: Diagnosis not present

## 2020-07-21 DIAGNOSIS — R1310 Dysphagia, unspecified: Secondary | ICD-10-CM | POA: Diagnosis not present

## 2020-07-21 DIAGNOSIS — R338 Other retention of urine: Secondary | ICD-10-CM | POA: Diagnosis not present

## 2020-07-21 DIAGNOSIS — I10 Essential (primary) hypertension: Secondary | ICD-10-CM | POA: Diagnosis not present

## 2020-07-21 DIAGNOSIS — E785 Hyperlipidemia, unspecified: Secondary | ICD-10-CM | POA: Diagnosis not present

## 2020-07-21 DIAGNOSIS — I69354 Hemiplegia and hemiparesis following cerebral infarction affecting left non-dominant side: Secondary | ICD-10-CM | POA: Diagnosis not present

## 2020-07-21 DIAGNOSIS — T17908D Unspecified foreign body in respiratory tract, part unspecified causing other injury, subsequent encounter: Secondary | ICD-10-CM | POA: Diagnosis not present

## 2020-07-21 DIAGNOSIS — Z466 Encounter for fitting and adjustment of urinary device: Secondary | ICD-10-CM | POA: Diagnosis not present

## 2020-07-21 DIAGNOSIS — N401 Enlarged prostate with lower urinary tract symptoms: Secondary | ICD-10-CM | POA: Diagnosis not present

## 2020-07-23 DIAGNOSIS — T17908D Unspecified foreign body in respiratory tract, part unspecified causing other injury, subsequent encounter: Secondary | ICD-10-CM | POA: Diagnosis not present

## 2020-07-23 DIAGNOSIS — E785 Hyperlipidemia, unspecified: Secondary | ICD-10-CM | POA: Diagnosis not present

## 2020-07-23 DIAGNOSIS — R1310 Dysphagia, unspecified: Secondary | ICD-10-CM | POA: Diagnosis not present

## 2020-07-23 DIAGNOSIS — Z431 Encounter for attention to gastrostomy: Secondary | ICD-10-CM | POA: Diagnosis not present

## 2020-07-23 DIAGNOSIS — Z466 Encounter for fitting and adjustment of urinary device: Secondary | ICD-10-CM | POA: Diagnosis not present

## 2020-07-23 DIAGNOSIS — I69354 Hemiplegia and hemiparesis following cerebral infarction affecting left non-dominant side: Secondary | ICD-10-CM | POA: Diagnosis not present

## 2020-07-23 DIAGNOSIS — I10 Essential (primary) hypertension: Secondary | ICD-10-CM | POA: Diagnosis not present

## 2020-07-23 DIAGNOSIS — R338 Other retention of urine: Secondary | ICD-10-CM | POA: Diagnosis not present

## 2020-07-23 DIAGNOSIS — N401 Enlarged prostate with lower urinary tract symptoms: Secondary | ICD-10-CM | POA: Diagnosis not present

## 2020-07-28 ENCOUNTER — Telehealth: Payer: Self-pay | Admitting: Adult Health

## 2020-07-28 DIAGNOSIS — I69354 Hemiplegia and hemiparesis following cerebral infarction affecting left non-dominant side: Secondary | ICD-10-CM | POA: Diagnosis not present

## 2020-07-28 DIAGNOSIS — I63511 Cerebral infarction due to unspecified occlusion or stenosis of right middle cerebral artery: Secondary | ICD-10-CM | POA: Diagnosis not present

## 2020-07-28 DIAGNOSIS — M6281 Muscle weakness (generalized): Secondary | ICD-10-CM | POA: Diagnosis not present

## 2020-07-28 NOTE — Telephone Encounter (Signed)
Pt's daughter called needing to move pts appt sooner due to feeding tube leaking fluid and kind of chewed up food. Pt has been r/s for next week but daughter stated that if he needs to be moved up sooner she would be ok bringing him sooner.   I Jael spoke to daughter in spanish.   Please advise.

## 2020-07-29 DIAGNOSIS — E785 Hyperlipidemia, unspecified: Secondary | ICD-10-CM | POA: Diagnosis not present

## 2020-07-29 DIAGNOSIS — I10 Essential (primary) hypertension: Secondary | ICD-10-CM | POA: Diagnosis not present

## 2020-07-29 DIAGNOSIS — Z466 Encounter for fitting and adjustment of urinary device: Secondary | ICD-10-CM | POA: Diagnosis not present

## 2020-07-29 DIAGNOSIS — R338 Other retention of urine: Secondary | ICD-10-CM | POA: Diagnosis not present

## 2020-07-29 DIAGNOSIS — T17908D Unspecified foreign body in respiratory tract, part unspecified causing other injury, subsequent encounter: Secondary | ICD-10-CM | POA: Diagnosis not present

## 2020-07-29 DIAGNOSIS — R1310 Dysphagia, unspecified: Secondary | ICD-10-CM | POA: Diagnosis not present

## 2020-07-29 DIAGNOSIS — I69354 Hemiplegia and hemiparesis following cerebral infarction affecting left non-dominant side: Secondary | ICD-10-CM | POA: Diagnosis not present

## 2020-07-29 DIAGNOSIS — Z431 Encounter for attention to gastrostomy: Secondary | ICD-10-CM | POA: Diagnosis not present

## 2020-07-29 DIAGNOSIS — N401 Enlarged prostate with lower urinary tract symptoms: Secondary | ICD-10-CM | POA: Diagnosis not present

## 2020-07-29 NOTE — Telephone Encounter (Addendum)
Spoke to daughter [name] in spanish and informed her that they can speak to the PCP to see if she can get a referral for the pt to a Development worker, international aid. She states that the pt has just seen the PCP and the PCP stated that he will see the pt after he sees the Neurologist. Pt is scheduled to see the PCP again on 09/12/20. Daughter stated that they will wait to see Anderson Malta. NP to get the referral to the General Surgeon.

## 2020-07-29 NOTE — Telephone Encounter (Signed)
Left message voicemail for Gregory Osborn. (Need spanishinterpreter).   I spoke to Gregory Osborn in the phone room.  She speaks Spanish and did speak to her (Gregory Osborn) yesterday regarding moving up the appointment.  Scheduled 08/05/2020 with Shanda Bumps.  I was unsure of them coming in for leaking G-tube.  That was the reason for my message if they call back Gregory Osborn will relayed that can be in touch with primary care for referral to general surgery and I will forward as FYI to Jessica/Dr. Pearlean Brownie. Pt at home, is eating, not using GT).

## 2020-07-30 DIAGNOSIS — I69354 Hemiplegia and hemiparesis following cerebral infarction affecting left non-dominant side: Secondary | ICD-10-CM | POA: Diagnosis not present

## 2020-07-30 DIAGNOSIS — Z431 Encounter for attention to gastrostomy: Secondary | ICD-10-CM | POA: Diagnosis not present

## 2020-07-30 DIAGNOSIS — I10 Essential (primary) hypertension: Secondary | ICD-10-CM | POA: Diagnosis not present

## 2020-07-30 DIAGNOSIS — T17908D Unspecified foreign body in respiratory tract, part unspecified causing other injury, subsequent encounter: Secondary | ICD-10-CM | POA: Diagnosis not present

## 2020-07-30 DIAGNOSIS — R1310 Dysphagia, unspecified: Secondary | ICD-10-CM | POA: Diagnosis not present

## 2020-07-30 DIAGNOSIS — N401 Enlarged prostate with lower urinary tract symptoms: Secondary | ICD-10-CM | POA: Diagnosis not present

## 2020-07-30 DIAGNOSIS — R338 Other retention of urine: Secondary | ICD-10-CM | POA: Diagnosis not present

## 2020-07-30 DIAGNOSIS — E785 Hyperlipidemia, unspecified: Secondary | ICD-10-CM | POA: Diagnosis not present

## 2020-07-30 DIAGNOSIS — Z466 Encounter for fitting and adjustment of urinary device: Secondary | ICD-10-CM | POA: Diagnosis not present

## 2020-07-31 ENCOUNTER — Other Ambulatory Visit: Payer: Self-pay

## 2020-07-31 NOTE — Patient Outreach (Signed)
[facility]) Care Management  07/31/2020  Amrom Ore 02/10/1946 [id number]   First telephone outreach attempt to obtain mRS. No answer. Left message for returned call.  [facility] Management Assistant [phone]

## 2020-08-04 DIAGNOSIS — Z466 Encounter for fitting and adjustment of urinary device: Secondary | ICD-10-CM | POA: Diagnosis not present

## 2020-08-04 DIAGNOSIS — T17908D Unspecified foreign body in respiratory tract, part unspecified causing other injury, subsequent encounter: Secondary | ICD-10-CM | POA: Diagnosis not present

## 2020-08-04 DIAGNOSIS — Z431 Encounter for attention to gastrostomy: Secondary | ICD-10-CM | POA: Diagnosis not present

## 2020-08-04 DIAGNOSIS — I10 Essential (primary) hypertension: Secondary | ICD-10-CM | POA: Diagnosis not present

## 2020-08-04 DIAGNOSIS — R338 Other retention of urine: Secondary | ICD-10-CM | POA: Diagnosis not present

## 2020-08-04 DIAGNOSIS — I69354 Hemiplegia and hemiparesis following cerebral infarction affecting left non-dominant side: Secondary | ICD-10-CM | POA: Diagnosis not present

## 2020-08-04 DIAGNOSIS — N401 Enlarged prostate with lower urinary tract symptoms: Secondary | ICD-10-CM | POA: Diagnosis not present

## 2020-08-04 DIAGNOSIS — E785 Hyperlipidemia, unspecified: Secondary | ICD-10-CM | POA: Diagnosis not present

## 2020-08-04 DIAGNOSIS — R1310 Dysphagia, unspecified: Secondary | ICD-10-CM | POA: Diagnosis not present

## 2020-08-04 NOTE — Telephone Encounter (Signed)
We do not manage G-tubes and will need referral to general surgery to see if replacement is needed or possible removal as he has not been using.  I will further discuss this with patient and daughter at tomorrow's visit.  Thank you.

## 2020-08-05 ENCOUNTER — Other Ambulatory Visit: Payer: Self-pay

## 2020-08-05 ENCOUNTER — Encounter: Payer: Self-pay | Admitting: Adult Health

## 2020-08-05 ENCOUNTER — Ambulatory Visit: Payer: Medicare HMO | Admitting: Adult Health

## 2020-08-05 ENCOUNTER — Telehealth: Payer: Self-pay

## 2020-08-05 VITALS — BP 131/77 | HR 87

## 2020-08-05 DIAGNOSIS — I10 Essential (primary) hypertension: Secondary | ICD-10-CM

## 2020-08-05 DIAGNOSIS — K9423 Gastrostomy malfunction: Secondary | ICD-10-CM | POA: Diagnosis not present

## 2020-08-05 DIAGNOSIS — G8194 Hemiplegia, unspecified affecting left nondominant side: Secondary | ICD-10-CM

## 2020-08-05 DIAGNOSIS — R6889 Other general symptoms and signs: Secondary | ICD-10-CM | POA: Diagnosis not present

## 2020-08-05 DIAGNOSIS — I63411 Cerebral infarction due to embolism of right middle cerebral artery: Secondary | ICD-10-CM

## 2020-08-05 DIAGNOSIS — E785 Hyperlipidemia, unspecified: Secondary | ICD-10-CM

## 2020-08-05 NOTE — Telephone Encounter (Signed)
Referral for general surgery sent to [facility]. P: [phone]

## 2020-08-05 NOTE — Patient Instructions (Signed)
Contiue working with therapies at home - possibly transition to outpatient therapies once completed  Order will be placed to general surgery for removal of PEG tube  Continue aspirin 81 mg daily  and atorvastatin  for secondary stroke prevention  Continue to follow up with PCP regarding cholesterol and blood pressure management  Maintain strict control of hypertension with blood pressure goal below 130/90 and cholesterol with LDL cholesterol (bad cholesterol) goal below 70 mg/dL.       Followup in the future with me in 3 months or call earlier if needed       Thank you for coming to see [place] at [facility]. I hope we have been able to provide you high quality care today.  You may receive a patient satisfaction survey over the next few weeks. We would appreciate your feedback and comments so that we may continue to improve ourselves and the health of our patients.

## 2020-08-05 NOTE — Progress Notes (Signed)
[facility] [address] [place]. [place] [address] [phone]       STROKE FOLLOW UP NOTE  [name] Date of Birth:  [birth year] Medical Record Number:  [id number]   Reason for visit: acute visit for G-tube leaking    SUBJECTIVE:   CHIEF COMPLAINT:  Chief Complaint  Patient presents with   Follow-up    RM 49 with daughter Gregory Osborn (&[employer] interpreter) Pt hasn't been feeling well, unable to move L side, still having some vision impairment in R eye.     HPI:   Today, 08/05/2020, Gregory Osborn returns for sooner visit due to concern of leaking G-tube.  He is accompanied by his daughter and [facility] interpreter.  He has since returned back home where daughter was told by home health nurse to contact our office due to G-tube concern.  G-tube has not been used in the past few months and is taking all nutrition, fluids and pills by mouth without difficulty.  He continues to work with home health PT reporting improvement since prior visit.  He is able to stand for short period of time but is not able to ambulate.  Continue left hemiplegia and visual impairment.  Foley catheter remains intact and has follow-up with urology next week for possible removal per daughter.  Does endorse occasional slurred speech.  Denies new stroke/TIA symptoms.  Compliant on aspirin and atorvastatin without associated side effects.  Blood pressure today 131/77.  No further concerns at this time.    History provided for reference purposes only Initial visit 06/10/2020 [name] is being seen for hospital follow-up accompanied by his daughter and [facility] interpreter.  He continues to reside at [facility] Reports residual left-sided weakness, left-sided numbness/tingling, blurred vision and dysphagia.  Does report mild speech difficulty but overall improved.  Currently receiving PT but per daughter and patient, has not received any OT or SLP.  He questions removal of  PEG tube as he is eating a modified diet without difficulty and taking pills whole by mouth - PEG tube not currently being used.  He remains nonambulatory and uses [employer] lift for transfers.  Daughter questions reason why he needs to continue residing at [facility] as she was initially told he would likely be there for 2 months and as he is only receiving PT, she questions benefit.  Denies new or worsening stroke/TIA symptoms  Per review of facility MAR, remains on aspirin 325 mg daily and atorvastatin 40 mg daily without associated side effects Blood pressure today 137/77 - unknown levels at facility but per [facility], not currently receiving antihypertensives  Foley catheter remains in place with clear yellow urine draining -continues to follow with urology No further concerns at this time  Stroke admission 04/01/2020 Gregory Osborn is a 75 y.o. male with a history of recent urologic issues including UTI (01/22), urinary retention with foley cath placed, hematuria (likely traumatic due to self cath) s/p cystoscopy 04/01/20 by Dr. Pete [facility]atter [facility] showing BPH with recommended surgical treatment. Surgical history includes appendectomy. He had been doing well up until 11:25am on 2/16 when he was noted to develop left hemiplegia. EMS was contacted, and took him to the [facility] ED where stroke workup was undertaken. He was noted to have left hemiplegia and neglect with NIHSS score 15.  Shortly transferred to [facility].  Personally reviewed hospitalization pertinent progress notes, lab work and imaging with summary provided.  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  Repeat CT head 2/19 showed worsening shift and edema with MLS 12mm and 2/23 right MCA large infarct with MLS 14mm.  Evaluated by neurosurgery but patient deemed not to be a surgical candidate.  Recommend consideration of 30-day cardiac event monitor  outpatient to rule out A. fib.  Initiated aspirin 325 mg daily for secondary stroke prevention.  LDL 118 -started atorvastatin 40 mg daily.  Other stroke risk factors include EtOH use but no prior stroke history.  PEG tube placed 3/8 in setting of dysphagia and poor p.o. intake.  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   MRI showed large right MCA infarct involving entire right MCA, consistent with malignant right MCA syndrome.  Small right SAH likely due to hemorrhagic conversion.   MRA head showed right M1 occluded.  CT repeat 2/23 right MCA large infarct with MLS 14mm 2D Echo EF 60-65%, No shunt  May consider 30 day cardiac event monitoring as outpt to rule out afib LDL 118 HgbA1c 5.6 VTE prophylaxis - heparin subq No antithrombotics PTA, now on ASA 325mg  Therapy recommendations:  SNF Disposition:  SNF 3/11        ROS:   14 system review of systems performed and negative with exception of those listed in HPI  PMH: History reviewed. No pertinent past medical history.  PSH:  Past Surgical History:  Procedure Laterality Date   ESOPHAGOGASTRODUODENOSCOPY (EGD) WITH PROPOFOL N/A 04/22/2020   Procedure: ESOPHAGOGASTRODUODENOSCOPY (EGD) WITH PROPOFOL;  Surgeon: Violeta Gelinashompson, Burke, MD;  [facility];  Service: General;  Laterality: N/A;   IR CT HEAD LTD  04/03/2020   IR CT HEAD LTD  04/03/2020   IR PERCUTANEOUS ART THROMBECTOMY/INFUSION INTRACRANIAL INC DIAG ANGIO  04/03/2020   PEG PLACEMENT N/A 04/22/2020   Procedure: PERCUTANEOUS ENDOSCOPIC GASTROSTOMY (PEG) PLACEMENT;  Surgeon: Violeta Gelinashompson, Burke, MD;  [facility];  Service: General;  Laterality: N/A;   RADIOLOGY WITH ANESTHESIA N/A 04/02/2020    Procedure: IR WITH ANESTHESIA;  Surgeon: Baldemar Lenisde Macedo Rodrigues, Katyucia, MD;  [facility];  Service: Radiology;  Laterality: N/A;    Social History:  Social History   Socioeconomic History   Marital status: Unknown    Spouse name: Not on file   Number of children: Not on file   Years of education: Not on file   Highest education level: Not on file  Occupational History   Not on file  Tobacco Use   Smoking status: Never   Smokeless tobacco: Never  Substance and Sexual Activity   Alcohol use: Not on file   Drug use: Not on file   Sexual activity: Not on file  Other Topics Concern   Not on file  Social History Narrative   Not on file   Social Determinants of Health   Financial Resource Strain: Not on file  Food Insecurity: Not on file  Transportation Needs: Not on file  Physical Activity: Not on file  Stress: Not on file  Social Connections: Not on file  Intimate Partner Violence: Not on file    Family History: History reviewed. No pertinent family history.  Medications:   Current Outpatient Medications on File Prior to Visit  Medication Sig Dispense Refill  aspirin 325 MG tablet Place 1 tablet (325 mg total) into feeding tube daily.     atorvastatin (LIPITOR) 40 MG tablet Place 1 tablet (40 mg total) into feeding tube daily.     baclofen 5 MG TABS Place 5 mg into feeding tube 3 (three) times daily. 30 tablet 0   lisinopril (ZESTRIL) 10 MG tablet Take 10 mg by mouth daily.     No current facility-administered medications on file prior to visit.    Allergies:  No Known Allergies    OBJECTIVE:  Physical Exam  Vitals:   08/05/20 1532  BP: 131/77  Pulse: 87   There is no height or weight on file to calculate BMI. No results found.  General: Frail pleasant elderly Hispanic male, seated, in no evident distress Head: head normocephalic and atraumatic.   Neck: supple with no carotid or supraclavicular bruits Cardiovascular: regular rate and rhythm, no  murmurs Musculoskeletal: no deformity Skin:  no rash/petichiae; PEG tube intact without evidence of infection Vascular:  Normal pulses all extremities   Neurologic Exam Mental Status: Awake and fully alert.  Reports occasional slurred speech but difficulty fully assessing as he is Spanish-speaking.  Recent and remote memory impaired although gradually improving per daughter. Attention span, concentration and fund of knowledge slightly impaired but able to provide more history compared to prior visit. Mood and affect appropriate.  Cranial Nerves: Pupils equal, briskly reactive to light. Extraocular movements full without nystagmus although right gaze preference but able to cross midline.  Left sided inattention. Visual fields no blink to threat on left side and legs appropriately to threat. Hearing intact. Facial sensation intact.  Left lower facial paralysis.  Tongue and palate moves normally and symmetrically. Motor: Normal strength, bulk and tone on right side.  Dense left hemiplegia Sensory.:  Reports equal sensation throughout all tested extremities Coordination: Rapid alternating movements normal on right side. Finger-to-nose and heel-to-shin performed accurately on right side Gait and Station: Deferred as patient nonambulatory Reflexes: 2+ LUE and LLE; 1+ RUE and RLE. Toes downgoing.         ASSESSMENT: Gregory Osborn is a 75 y.o. 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  Midline shift noted with increased shift and edema 2/19 and 2/23 with MLS 68mm.  Vascular risk factors include HTN, HLD, advanced age and EtOH use.      PLAN:  R MCA stroke :  Significant residual deficits including left hemiplegia  with neglect, right peripheral visual impairment and subjective dysarthria.  Continue working with [facility] therapies and potentially transition to outpatient therapies once completed if indicated. Referral placed to general surgery for removal of G-tube as it is currently leaking and not actively being used May consider further cardiac monitoring in the future as recommended at discharge to rule out A. Fib but will hold off right now in setting of his current functional condition. He would also need to be cleared by urology to be possible AC candidate in setting of prior hematuria Continue aspirin 325 mg daily  and atorvastatin for secondary stroke prevention.   Discussed secondary stroke prevention measures and importance of close PCP follow up for aggressive stroke risk factor management  HTN: BP goal <130/90.  Stable today on nonpharmacological management per facility HLD: LDL goal <70.  On atorvastatin 40 mg daily -request repeat level at follow-up visit with PCP as he is not currently fasting    Follow up in 3 months or call earlier if needed   CC:  [facility] provider: Dr. Pearlean Brownie PCP: Charlott Rakes, MD    I spent 38 minutes of face-to-face and non-face-to-face time with patient and daughter assisted by interpreter.  This included previsit chart review, order entry, electronic health record documentation, and patient and daughter education and discussion regarding concern of G-tube and indication for removal, prior stroke and secondary stroke prevention measures as well as importance of managing stroke risk factors, residual deficits and 1 participation with therapy and answered all other questions to patient and daughter satisfaction  Ihor Austin, AGNP-BC  [facility] [address] [place], [place] [address]  Phone [phone] Fax [phone] Note: This document was prepared with digital dictation and possible smart phrase technology. Any transcriptional  errors that result from this process are unintentional.

## 2020-08-06 ENCOUNTER — Other Ambulatory Visit: Payer: Self-pay

## 2020-08-06 DIAGNOSIS — R1310 Dysphagia, unspecified: Secondary | ICD-10-CM | POA: Diagnosis not present

## 2020-08-06 DIAGNOSIS — I69354 Hemiplegia and hemiparesis following cerebral infarction affecting left non-dominant side: Secondary | ICD-10-CM | POA: Diagnosis not present

## 2020-08-06 DIAGNOSIS — T17908D Unspecified foreign body in respiratory tract, part unspecified causing other injury, subsequent encounter: Secondary | ICD-10-CM | POA: Diagnosis not present

## 2020-08-06 DIAGNOSIS — Z466 Encounter for fitting and adjustment of urinary device: Secondary | ICD-10-CM | POA: Diagnosis not present

## 2020-08-06 DIAGNOSIS — Z431 Encounter for attention to gastrostomy: Secondary | ICD-10-CM | POA: Diagnosis not present

## 2020-08-06 DIAGNOSIS — E785 Hyperlipidemia, unspecified: Secondary | ICD-10-CM | POA: Diagnosis not present

## 2020-08-06 DIAGNOSIS — I10 Essential (primary) hypertension: Secondary | ICD-10-CM | POA: Diagnosis not present

## 2020-08-06 DIAGNOSIS — N401 Enlarged prostate with lower urinary tract symptoms: Secondary | ICD-10-CM | POA: Diagnosis not present

## 2020-08-06 DIAGNOSIS — R338 Other retention of urine: Secondary | ICD-10-CM | POA: Diagnosis not present

## 2020-08-06 NOTE — Patient Outreach (Signed)
[facility]) Care Management  08/06/2020  Damaris Geers [birth year] [id number]   Telephone outreach to patient to obtain mRS was successfully completed. MRS= 5   [facility] Care Management Assistant

## 2020-08-11 DIAGNOSIS — R339 Retention of urine, unspecified: Secondary | ICD-10-CM | POA: Diagnosis not present

## 2020-08-11 DIAGNOSIS — N138 Other obstructive and reflux uropathy: Secondary | ICD-10-CM | POA: Diagnosis not present

## 2020-08-11 DIAGNOSIS — N401 Enlarged prostate with lower urinary tract symptoms: Secondary | ICD-10-CM | POA: Diagnosis not present

## 2020-08-14 DIAGNOSIS — Z431 Encounter for attention to gastrostomy: Secondary | ICD-10-CM | POA: Diagnosis not present

## 2020-08-14 DIAGNOSIS — R338 Other retention of urine: Secondary | ICD-10-CM | POA: Diagnosis not present

## 2020-08-14 DIAGNOSIS — E785 Hyperlipidemia, unspecified: Secondary | ICD-10-CM | POA: Diagnosis not present

## 2020-08-14 DIAGNOSIS — N401 Enlarged prostate with lower urinary tract symptoms: Secondary | ICD-10-CM | POA: Diagnosis not present

## 2020-08-14 DIAGNOSIS — R1310 Dysphagia, unspecified: Secondary | ICD-10-CM | POA: Diagnosis not present

## 2020-08-14 DIAGNOSIS — I69354 Hemiplegia and hemiparesis following cerebral infarction affecting left non-dominant side: Secondary | ICD-10-CM | POA: Diagnosis not present

## 2020-08-14 DIAGNOSIS — T17908D Unspecified foreign body in respiratory tract, part unspecified causing other injury, subsequent encounter: Secondary | ICD-10-CM | POA: Diagnosis not present

## 2020-08-14 DIAGNOSIS — I10 Essential (primary) hypertension: Secondary | ICD-10-CM | POA: Diagnosis not present

## 2020-08-14 DIAGNOSIS — Z466 Encounter for fitting and adjustment of urinary device: Secondary | ICD-10-CM | POA: Diagnosis not present

## 2020-08-15 NOTE — Progress Notes (Signed)
I agree with the above plan 

## 2020-08-19 DIAGNOSIS — Z20822 Contact with and (suspected) exposure to covid-19: Secondary | ICD-10-CM | POA: Diagnosis not present

## 2020-08-19 DIAGNOSIS — J029 Acute pharyngitis, unspecified: Secondary | ICD-10-CM | POA: Diagnosis not present

## 2020-08-19 DIAGNOSIS — Z1152 Encounter for screening for COVID-19: Secondary | ICD-10-CM | POA: Diagnosis not present

## 2020-08-19 DIAGNOSIS — U071 COVID-19: Secondary | ICD-10-CM | POA: Diagnosis not present

## 2020-08-20 DIAGNOSIS — R1310 Dysphagia, unspecified: Secondary | ICD-10-CM | POA: Diagnosis not present

## 2020-08-20 DIAGNOSIS — E785 Hyperlipidemia, unspecified: Secondary | ICD-10-CM | POA: Diagnosis not present

## 2020-08-20 DIAGNOSIS — R338 Other retention of urine: Secondary | ICD-10-CM | POA: Diagnosis not present

## 2020-08-20 DIAGNOSIS — I69354 Hemiplegia and hemiparesis following cerebral infarction affecting left non-dominant side: Secondary | ICD-10-CM | POA: Diagnosis not present

## 2020-08-20 DIAGNOSIS — T17908D Unspecified foreign body in respiratory tract, part unspecified causing other injury, subsequent encounter: Secondary | ICD-10-CM | POA: Diagnosis not present

## 2020-08-20 DIAGNOSIS — N401 Enlarged prostate with lower urinary tract symptoms: Secondary | ICD-10-CM | POA: Diagnosis not present

## 2020-08-20 DIAGNOSIS — I10 Essential (primary) hypertension: Secondary | ICD-10-CM | POA: Diagnosis not present

## 2020-08-20 DIAGNOSIS — Z431 Encounter for attention to gastrostomy: Secondary | ICD-10-CM | POA: Diagnosis not present

## 2020-08-20 DIAGNOSIS — Z466 Encounter for fitting and adjustment of urinary device: Secondary | ICD-10-CM | POA: Diagnosis not present

## 2020-08-25 DIAGNOSIS — R339 Retention of urine, unspecified: Secondary | ICD-10-CM | POA: Diagnosis not present

## 2020-08-25 DIAGNOSIS — R6889 Other general symptoms and signs: Secondary | ICD-10-CM | POA: Diagnosis not present

## 2020-08-26 DIAGNOSIS — I69354 Hemiplegia and hemiparesis following cerebral infarction affecting left non-dominant side: Secondary | ICD-10-CM | POA: Diagnosis not present

## 2020-08-26 DIAGNOSIS — Z431 Encounter for attention to gastrostomy: Secondary | ICD-10-CM | POA: Diagnosis not present

## 2020-08-26 DIAGNOSIS — R1310 Dysphagia, unspecified: Secondary | ICD-10-CM | POA: Diagnosis not present

## 2020-08-26 DIAGNOSIS — E785 Hyperlipidemia, unspecified: Secondary | ICD-10-CM | POA: Diagnosis not present

## 2020-08-26 DIAGNOSIS — N401 Enlarged prostate with lower urinary tract symptoms: Secondary | ICD-10-CM | POA: Diagnosis not present

## 2020-08-26 DIAGNOSIS — Z466 Encounter for fitting and adjustment of urinary device: Secondary | ICD-10-CM | POA: Diagnosis not present

## 2020-08-26 DIAGNOSIS — I10 Essential (primary) hypertension: Secondary | ICD-10-CM | POA: Diagnosis not present

## 2020-08-26 DIAGNOSIS — T17908D Unspecified foreign body in respiratory tract, part unspecified causing other injury, subsequent encounter: Secondary | ICD-10-CM | POA: Diagnosis not present

## 2020-08-26 DIAGNOSIS — R338 Other retention of urine: Secondary | ICD-10-CM | POA: Diagnosis not present

## 2020-08-27 DIAGNOSIS — M6281 Muscle weakness (generalized): Secondary | ICD-10-CM | POA: Diagnosis not present

## 2020-08-27 DIAGNOSIS — I63511 Cerebral infarction due to unspecified occlusion or stenosis of right middle cerebral artery: Secondary | ICD-10-CM | POA: Diagnosis not present

## 2020-08-27 DIAGNOSIS — I69354 Hemiplegia and hemiparesis following cerebral infarction affecting left non-dominant side: Secondary | ICD-10-CM | POA: Diagnosis not present

## 2020-08-27 DIAGNOSIS — Z6827 Body mass index (BMI) 27.0-27.9, adult: Secondary | ICD-10-CM | POA: Diagnosis not present

## 2020-08-27 DIAGNOSIS — I10 Essential (primary) hypertension: Secondary | ICD-10-CM | POA: Diagnosis not present

## 2020-08-27 DIAGNOSIS — E785 Hyperlipidemia, unspecified: Secondary | ICD-10-CM | POA: Diagnosis not present

## 2020-08-27 DIAGNOSIS — Z8616 Personal history of COVID-19: Secondary | ICD-10-CM | POA: Diagnosis not present

## 2020-08-28 DIAGNOSIS — R6889 Other general symptoms and signs: Secondary | ICD-10-CM | POA: Diagnosis not present

## 2020-09-03 DIAGNOSIS — E785 Hyperlipidemia, unspecified: Secondary | ICD-10-CM | POA: Diagnosis not present

## 2020-09-03 DIAGNOSIS — I10 Essential (primary) hypertension: Secondary | ICD-10-CM | POA: Diagnosis not present

## 2020-09-03 DIAGNOSIS — T17908D Unspecified foreign body in respiratory tract, part unspecified causing other injury, subsequent encounter: Secondary | ICD-10-CM | POA: Diagnosis not present

## 2020-09-03 DIAGNOSIS — R338 Other retention of urine: Secondary | ICD-10-CM | POA: Diagnosis not present

## 2020-09-03 DIAGNOSIS — R1310 Dysphagia, unspecified: Secondary | ICD-10-CM | POA: Diagnosis not present

## 2020-09-03 DIAGNOSIS — K5900 Constipation, unspecified: Secondary | ICD-10-CM | POA: Diagnosis not present

## 2020-09-03 DIAGNOSIS — U071 COVID-19: Secondary | ICD-10-CM | POA: Diagnosis not present

## 2020-09-03 DIAGNOSIS — I69354 Hemiplegia and hemiparesis following cerebral infarction affecting left non-dominant side: Secondary | ICD-10-CM | POA: Diagnosis not present

## 2020-09-03 DIAGNOSIS — N401 Enlarged prostate with lower urinary tract symptoms: Secondary | ICD-10-CM | POA: Diagnosis not present

## 2020-09-04 DIAGNOSIS — R6889 Other general symptoms and signs: Secondary | ICD-10-CM | POA: Diagnosis not present

## 2020-09-04 DIAGNOSIS — I63511 Cerebral infarction due to unspecified occlusion or stenosis of right middle cerebral artery: Secondary | ICD-10-CM | POA: Diagnosis not present

## 2020-09-04 DIAGNOSIS — Z931 Gastrostomy status: Secondary | ICD-10-CM | POA: Diagnosis not present

## 2020-09-09 ENCOUNTER — Ambulatory Visit: Payer: Medicare HMO | Admitting: Adult Health

## 2020-09-10 DIAGNOSIS — T17908D Unspecified foreign body in respiratory tract, part unspecified causing other injury, subsequent encounter: Secondary | ICD-10-CM | POA: Diagnosis not present

## 2020-09-10 DIAGNOSIS — R338 Other retention of urine: Secondary | ICD-10-CM | POA: Diagnosis not present

## 2020-09-10 DIAGNOSIS — U071 COVID-19: Secondary | ICD-10-CM | POA: Diagnosis not present

## 2020-09-10 DIAGNOSIS — I10 Essential (primary) hypertension: Secondary | ICD-10-CM | POA: Diagnosis not present

## 2020-09-10 DIAGNOSIS — N401 Enlarged prostate with lower urinary tract symptoms: Secondary | ICD-10-CM | POA: Diagnosis not present

## 2020-09-10 DIAGNOSIS — R1310 Dysphagia, unspecified: Secondary | ICD-10-CM | POA: Diagnosis not present

## 2020-09-10 DIAGNOSIS — E785 Hyperlipidemia, unspecified: Secondary | ICD-10-CM | POA: Diagnosis not present

## 2020-09-10 DIAGNOSIS — K5900 Constipation, unspecified: Secondary | ICD-10-CM | POA: Diagnosis not present

## 2020-09-10 DIAGNOSIS — I69354 Hemiplegia and hemiparesis following cerebral infarction affecting left non-dominant side: Secondary | ICD-10-CM | POA: Diagnosis not present

## 2020-09-15 DIAGNOSIS — U071 COVID-19: Secondary | ICD-10-CM | POA: Diagnosis not present

## 2020-09-15 DIAGNOSIS — R1310 Dysphagia, unspecified: Secondary | ICD-10-CM | POA: Diagnosis not present

## 2020-09-15 DIAGNOSIS — N401 Enlarged prostate with lower urinary tract symptoms: Secondary | ICD-10-CM | POA: Diagnosis not present

## 2020-09-15 DIAGNOSIS — E785 Hyperlipidemia, unspecified: Secondary | ICD-10-CM | POA: Diagnosis not present

## 2020-09-15 DIAGNOSIS — R338 Other retention of urine: Secondary | ICD-10-CM | POA: Diagnosis not present

## 2020-09-15 DIAGNOSIS — K5900 Constipation, unspecified: Secondary | ICD-10-CM | POA: Diagnosis not present

## 2020-09-15 DIAGNOSIS — I69354 Hemiplegia and hemiparesis following cerebral infarction affecting left non-dominant side: Secondary | ICD-10-CM | POA: Diagnosis not present

## 2020-09-15 DIAGNOSIS — T17908D Unspecified foreign body in respiratory tract, part unspecified causing other injury, subsequent encounter: Secondary | ICD-10-CM | POA: Diagnosis not present

## 2020-09-15 DIAGNOSIS — I10 Essential (primary) hypertension: Secondary | ICD-10-CM | POA: Diagnosis not present

## 2020-09-22 DIAGNOSIS — N401 Enlarged prostate with lower urinary tract symptoms: Secondary | ICD-10-CM | POA: Diagnosis not present

## 2020-09-22 DIAGNOSIS — T17908D Unspecified foreign body in respiratory tract, part unspecified causing other injury, subsequent encounter: Secondary | ICD-10-CM | POA: Diagnosis not present

## 2020-09-22 DIAGNOSIS — R338 Other retention of urine: Secondary | ICD-10-CM | POA: Diagnosis not present

## 2020-09-22 DIAGNOSIS — U071 COVID-19: Secondary | ICD-10-CM | POA: Diagnosis not present

## 2020-09-22 DIAGNOSIS — R1310 Dysphagia, unspecified: Secondary | ICD-10-CM | POA: Diagnosis not present

## 2020-09-22 DIAGNOSIS — K5900 Constipation, unspecified: Secondary | ICD-10-CM | POA: Diagnosis not present

## 2020-09-22 DIAGNOSIS — I69354 Hemiplegia and hemiparesis following cerebral infarction affecting left non-dominant side: Secondary | ICD-10-CM | POA: Diagnosis not present

## 2020-09-22 DIAGNOSIS — I10 Essential (primary) hypertension: Secondary | ICD-10-CM | POA: Diagnosis not present

## 2020-09-22 DIAGNOSIS — E785 Hyperlipidemia, unspecified: Secondary | ICD-10-CM | POA: Diagnosis not present

## 2020-09-24 DIAGNOSIS — R339 Retention of urine, unspecified: Secondary | ICD-10-CM | POA: Diagnosis not present

## 2020-09-24 DIAGNOSIS — R6889 Other general symptoms and signs: Secondary | ICD-10-CM | POA: Diagnosis not present

## 2020-09-24 DIAGNOSIS — R8290 Unspecified abnormal findings in urine: Secondary | ICD-10-CM | POA: Diagnosis not present

## 2020-09-27 DIAGNOSIS — M6281 Muscle weakness (generalized): Secondary | ICD-10-CM | POA: Diagnosis not present

## 2020-09-27 DIAGNOSIS — I63511 Cerebral infarction due to unspecified occlusion or stenosis of right middle cerebral artery: Secondary | ICD-10-CM | POA: Diagnosis not present

## 2020-09-27 DIAGNOSIS — I69354 Hemiplegia and hemiparesis following cerebral infarction affecting left non-dominant side: Secondary | ICD-10-CM | POA: Diagnosis not present

## 2020-10-01 DIAGNOSIS — I679 Cerebrovascular disease, unspecified: Secondary | ICD-10-CM | POA: Diagnosis not present

## 2020-10-01 DIAGNOSIS — G8194 Hemiplegia, unspecified affecting left nondominant side: Secondary | ICD-10-CM | POA: Diagnosis not present

## 2020-10-01 DIAGNOSIS — Z789 Other specified health status: Secondary | ICD-10-CM | POA: Diagnosis not present

## 2020-10-01 DIAGNOSIS — Z7409 Other reduced mobility: Secondary | ICD-10-CM | POA: Diagnosis not present

## 2020-10-22 DIAGNOSIS — R339 Retention of urine, unspecified: Secondary | ICD-10-CM | POA: Diagnosis not present

## 2020-10-22 DIAGNOSIS — Z01812 Encounter for preprocedural laboratory examination: Secondary | ICD-10-CM | POA: Diagnosis not present

## 2020-10-22 DIAGNOSIS — Z01810 Encounter for preprocedural cardiovascular examination: Secondary | ICD-10-CM | POA: Diagnosis not present

## 2020-10-23 DIAGNOSIS — I517 Cardiomegaly: Secondary | ICD-10-CM | POA: Diagnosis not present

## 2020-10-23 DIAGNOSIS — R338 Other retention of urine: Secondary | ICD-10-CM | POA: Diagnosis not present

## 2020-10-23 DIAGNOSIS — N400 Enlarged prostate without lower urinary tract symptoms: Secondary | ICD-10-CM | POA: Diagnosis not present

## 2020-10-23 DIAGNOSIS — N401 Enlarged prostate with lower urinary tract symptoms: Secondary | ICD-10-CM | POA: Diagnosis not present

## 2020-10-23 DIAGNOSIS — N138 Other obstructive and reflux uropathy: Secondary | ICD-10-CM | POA: Diagnosis not present

## 2020-10-24 DIAGNOSIS — R338 Other retention of urine: Secondary | ICD-10-CM | POA: Diagnosis not present

## 2020-10-24 DIAGNOSIS — N401 Enlarged prostate with lower urinary tract symptoms: Secondary | ICD-10-CM | POA: Diagnosis not present

## 2020-10-28 DIAGNOSIS — M6281 Muscle weakness (generalized): Secondary | ICD-10-CM | POA: Diagnosis not present

## 2020-10-28 DIAGNOSIS — I69354 Hemiplegia and hemiparesis following cerebral infarction affecting left non-dominant side: Secondary | ICD-10-CM | POA: Diagnosis not present

## 2020-10-28 DIAGNOSIS — I63511 Cerebral infarction due to unspecified occlusion or stenosis of right middle cerebral artery: Secondary | ICD-10-CM | POA: Diagnosis not present

## 2020-11-03 DIAGNOSIS — N400 Enlarged prostate without lower urinary tract symptoms: Secondary | ICD-10-CM | POA: Diagnosis not present

## 2020-11-03 DIAGNOSIS — N401 Enlarged prostate with lower urinary tract symptoms: Secondary | ICD-10-CM | POA: Diagnosis not present

## 2020-11-03 DIAGNOSIS — R6889 Other general symptoms and signs: Secondary | ICD-10-CM | POA: Diagnosis not present

## 2020-11-03 DIAGNOSIS — Z9889 Other specified postprocedural states: Secondary | ICD-10-CM | POA: Diagnosis not present

## 2020-11-06 DIAGNOSIS — Z0100 Encounter for examination of eyes and vision without abnormal findings: Secondary | ICD-10-CM | POA: Diagnosis not present

## 2020-11-06 DIAGNOSIS — H524 Presbyopia: Secondary | ICD-10-CM | POA: Diagnosis not present

## 2020-11-17 ENCOUNTER — Ambulatory Visit (INDEPENDENT_AMBULATORY_CARE_PROVIDER_SITE_OTHER): Payer: Medicare HMO | Admitting: Adult Health

## 2020-11-17 ENCOUNTER — Encounter: Payer: Self-pay | Admitting: Adult Health

## 2020-11-17 ENCOUNTER — Other Ambulatory Visit: Payer: Self-pay

## 2020-11-17 VITALS — BP 144/78 | HR 76

## 2020-11-17 DIAGNOSIS — R6889 Other general symptoms and signs: Secondary | ICD-10-CM | POA: Diagnosis not present

## 2020-11-17 DIAGNOSIS — I63411 Cerebral infarction due to embolism of right middle cerebral artery: Secondary | ICD-10-CM | POA: Diagnosis not present

## 2020-11-17 DIAGNOSIS — G8194 Hemiplegia, unspecified affecting left nondominant side: Secondary | ICD-10-CM

## 2020-11-17 DIAGNOSIS — E785 Hyperlipidemia, unspecified: Secondary | ICD-10-CM | POA: Diagnosis not present

## 2020-11-17 DIAGNOSIS — I10 Essential (primary) hypertension: Secondary | ICD-10-CM | POA: Diagnosis not present

## 2020-11-17 NOTE — Progress Notes (Signed)
[facility] [address] [place]. [place] [address] [phone]       STROKE FOLLOW UP NOTE  [name] Date of Birth:  06-Jun-1945 Medical Record Number:  [id number]   Reason for visit: acute visit for G-tube leaking    SUBJECTIVE:   CHIEF COMPLAINT:  Chief Complaint  Patient presents with   Cerebrovascular Accident    Rm 2 wife- Gregory Osborn, interpreter- Gregory Osborn     HPI:   Gregory Osborn is a 75 y.o. 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  Routinely followed in office for stroke follow up.    Today, 11/17/2020, returns for 53-month follow-up accompanied by his wife and  interpreter.  He continues to reside with family and requires assistance for majority of ADLs.  He underwent PEG tube removal 09/04/2020 without complication.  Maintaining regular diet without difficulty but family needs to help feed as he will try to put too much in his mouth at once which will cause difficulty swallowing.  Wife reports good appetite.  Left hemiplegia - no improvement since prior visit.  Left peripheral impairment stable. He is able to stand but unable to take any steps.  Transfers via wheelchair.  Completed HH therapies - is scheduled for initial PT eval next Monday as [facility].  Denies new stroke/TIA symptoms.  Compliant on aspirin and atorvastatin without side effects.  Blood pressure today 144/78.   S/p TURP on 10/23/2020 for BPH and urinary retention.  Reports occasional hematuria but otherwise voiding well without continued use of Foley catheter.  Routinely followed by urology Gregory Osborn  No further concerns at this time    History provided for reference purposes only Update  08/05/2020 Gregory Osborn: [name] returns for sooner visit due to concern of leaking G-tube.  He is accompanied by his daughter and [facility] interpreter.  He has since returned back home where daughter was told by home health nurse to contact our office due to G-tube concern.  G-tube has not been used in the past few months and is taking all nutrition, fluids and pills by mouth without difficulty.  He continues to work with home health PT reporting improvement since prior visit.  He is able to stand for short period of time but is not able to ambulate.  Continue left hemiplegia and visual impairment.  Foley catheter remains intact and has follow-up with urology next week for possible removal per daughter.  Does endorse occasional slurred speech.  Denies new stroke/TIA symptoms.  Compliant on aspirin and atorvastatin without associated side effects.  Blood pressure today 131/77.  No further concerns at this time.  Initial visit 06/10/2020 Gregory Osborn: [name] is being seen for hospital follow-up accompanied by his daughter and [facility] interpreter.  He continues to reside at [facility] Reports residual left-sided weakness, left-sided numbness/tingling, blurred vision and dysphagia.  Does report mild speech difficulty but overall improved.  Currently receiving PT but per daughter and patient, has not received any OT or SLP.  He questions removal of PEG tube as he is eating a modified diet without difficulty and taking pills whole by mouth - PEG tube not currently being used.  He remains nonambulatory and uses Hoyer lift for transfers.  Daughter questions reason  why he needs to continue residing at [facility] as she was initially told he would likely be there for 2 months and as he is only receiving PT, she questions benefit.  Denies new or worsening stroke/TIA symptoms  Per review of facility MAR, remains on aspirin 325 mg daily and atorvastatin 40 mg daily without associated side effects Blood pressure today  137/77 - unknown levels at facility but per [facility], not currently receiving antihypertensives  Foley catheter remains in place with clear yellow urine draining -continues to follow with urology No further concerns at this time  Stroke admission 04/01/2020 Gregory Osborn is a 75 y.o. male with a history of recent urologic issues including UTI (01/22), urinary retention with foley cath placed, hematuria (likely traumatic due to self cath) s/p cystoscopy 04/01/20 by Dr. Pete Osborn [facility]) showing BPH with recommended surgical treatment. Surgical history includes appendectomy. He had been doing well up until 11:25am on 2/16 when he was noted to develop left hemiplegia. EMS was contacted, and took him to the [facility] ED where stroke workup was undertaken. He was noted to have left hemiplegia and neglect with NIHSS score 15.  Shortly transferred to [facility].  Personally reviewed hospitalization pertinent progress notes, lab work and imaging with summary provided.  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  Repeat CT head 2/19 showed worsening shift and edema with MLS 73mm and 2/23 right MCA large infarct with MLS 73mm.  Evaluated by neurosurgery but patient deemed not to be a surgical candidate.  Recommend consideration of 30-day cardiac event monitor outpatient to rule out A. fib.  Initiated aspirin 325 mg daily for secondary stroke prevention.  LDL 118 -started atorvastatin 40 mg daily.  Other stroke risk factors include EtOH use but no prior stroke history.  PEG tube placed 3/8 in setting of dysphagia and poor p.o. intake.  Evaluated by therapies and recommend discharge to SNF for ongoing therapy needs  Stroke - Right malignant MCA infarct due to right M1 occlusion s/p unsuccessful thrombectomy, embolic pattern, source unclear CT showed right MCA infarct,  CTA head and neck right M1 occlusion  status post  IR, unfortunately unsuccessful.   MRI showed large right MCA infarct involving entire right MCA, consistent with malignant right MCA syndrome.  Small right SAH likely due to hemorrhagic conversion.   MRA head showed right M1 occluded.  CT repeat 2/23 right MCA large infarct with MLS 49mm 2D Echo EF 60-65%, No shunt  May consider 30 day cardiac event monitoring as outpt to rule out afib LDL 118 HgbA1c 5.6 VTE prophylaxis - heparin subq No antithrombotics PTA, now on ASA 325mg  Therapy recommendations:  SNF Disposition:  SNF 3/11        ROS:   14 system review of systems performed and negative with exception of those listed in HPI  PMH: No past medical history on file.  PSH:  Past Surgical History:  Procedure Laterality Date   ESOPHAGOGASTRODUODENOSCOPY (EGD) WITH PROPOFOL N/A 04/22/2020   Procedure: ESOPHAGOGASTRODUODENOSCOPY (EGD) WITH PROPOFOL;  Surgeon: 06/22/2020, MD;  Location: [facility] ENDOSCOPY;  Service: General;  Laterality: N/A;   IR CT HEAD LTD  04/03/2020   IR CT HEAD LTD  04/03/2020   IR PERCUTANEOUS ART THROMBECTOMY/INFUSION INTRACRANIAL INC DIAG ANGIO  04/03/2020   PEG PLACEMENT N/A 04/22/2020   Procedure: PERCUTANEOUS ENDOSCOPIC GASTROSTOMY (PEG) PLACEMENT;  Surgeon: 06/22/2020, MD;  Location: [facility] ENDOSCOPY;  Service: General;  Laterality: N/A;   RADIOLOGY WITH ANESTHESIA N/A 04/02/2020   Procedure: IR WITH ANESTHESIA;  Surgeon: Baldemar Lenis, MD;  Location: [facility] OR;  Service: Radiology;  Laterality: N/A;    Social History:  Social History   Socioeconomic History   Marital status: Unknown    Spouse name: Not on file   Number of children: Not on file   Years of education: Not on file   Highest education level: Not on file  Occupational History   Not on file  Tobacco Use   Smoking status: Never   Smokeless tobacco: Never  Substance and Sexual Activity   Alcohol use: Not on file   Drug use: Not on file   Sexual activity: Not on file  Other  Topics Concern   Not on file  Social History Narrative   Not on file   Social Determinants of Health   Financial Resource Strain: Not on file  Food Insecurity: Not on file  Transportation Needs: Not on file  Physical Activity: Not on file  Stress: Not on file  Social Connections: Not on file  Intimate Partner Violence: Not on file    Family History: No family history on file.  Medications:   Current Outpatient Medications on File Prior to Visit  Medication Sig Dispense Refill   aspirin 325 MG tablet Place 1 tablet (325 mg total) into feeding tube daily.     atorvastatin (LIPITOR) 40 MG tablet Place 1 tablet (40 mg total) into feeding tube daily.     baclofen 5 MG TABS Place 5 mg into feeding tube 3 (three) times daily. 30 tablet 0   lisinopril (ZESTRIL) 10 MG tablet Take 10 mg by mouth daily.     No current facility-administered medications on file prior to visit.    Allergies:  No Known Allergies    OBJECTIVE:  Physical Exam  Vitals:   11/17/20 0853  BP: (!) 144/78  Pulse: 76    There is no height or weight on file to calculate BMI. No results found.  General: Frail pleasant elderly Hispanic male, seated, in no evident distress Head: head normocephalic and atraumatic.   Neck: supple with no carotid or supraclavicular bruits Cardiovascular: regular rate and rhythm, no murmurs Musculoskeletal: no deformity Skin:  no rash/petichiae Vascular:  Normal pulses all extremities   Neurologic Exam Mental Status: Awake and fully alert. Reports occasional slurred speech but difficulty fully assessing as he is Spanish-speaking.  Recent and remote memory intact. Attention span, concentration and fund of knowledge intact during visit. Mood and affect appropriate.  Cranial Nerves: Pupils equal, briskly reactive to light. Extraocular movements full without nystagmus.  Left sided inattention (although improved since prior visit). Visual fields no blink to threat on left side and  blinks appropriately to threat on right side. Hearing intact. Facial sensation intact.  Left lower facial paralysis.  Tongue and palate moves normally and symmetrically. Motor: Normal strength, bulk and tone on right side.  Dense left hemiplegia with increased tone Sensory.:  Reports equal sensation throughout all tested extremities Coordination: Rapid alternating movements normal on right side. Finger-to-nose and heel-to-shin performed accurately on right side Gait and Station: Deferred as patient nonambulatory Reflexes: 2+ LUE and LLE; 1+ RUE and RLE. Toes downgoing.        ASSESSMENT: Chasyn Cinque is a 75 y.o. 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  Multiple urological issues including UTI, urinary retention d/t BPH and hematuria s/p cystoscopy the day prior on 04/01/2020.  Vascular risk factors include HTN, HLD, advanced age and EtOH use. S/p TURP for BPH with retention 10/23/2020     PLAN:  R MCA stroke :  Significant residual deficits including left spastic hemiplegia with mild inattention, left peripheral visual impairment and subjective dysarthria.  Plans on transitioning to outpatient therapies at [facility]. He questions receiving additional HH therapies due to transportation concerns - advised to contact his insurance company as they may not cover additional HH therapies. It was discussed likely minimal to no further recovery as as he is currently 8 months post stroke.  Continue baclofen per PCP. S/p PEG tube removal 08/2020 May consider further cardiac monitoring in the future as recommended at discharge to rule out A. Fib but will hold off right now due to continued hematuria and would likely not be an appropriate candidate for [facility] if indicated. Will further discuss at f/u  visit Continue aspirin 325 mg daily  and atorvastatin for secondary stroke prevention.   Discussed secondary stroke prevention measures and importance of close PCP follow up for aggressive stroke risk factor management  HTN: BP goal <130/90.  Stable today on lisinopril 10 mg daily per PCP HLD: LDL goal <70.  On atorvastatin 40 mg daily. Reports lipid panel obtained by PCP which was satisfactory (unable to view via epic)    Follow up in 6 months or call earlier if needed   CC:  PCP: Charlott Rakes, MD    I spent 36 minutes of face-to-face and non-face-to-face time with patient and wife assisted by interpreter.  This included previsit chart review, electronic health record documentation, and patient and wife education regarding prior stroke and secondary stroke prevention measures as well as importance of managing stroke risk factors, residual deficits and further recovery and answered all other questions to patient and wife's satisfaction  Ihor Austin, AGNP-BC  [facility] [address] [place], [place] [address]  Phone [phone] Fax [phone] Note: This document was prepared with digital dictation and possible smart phrase technology. Any transcriptional errors that result from this process are unintentional.

## 2020-11-17 NOTE — Patient Instructions (Addendum)
Conti2nue aspirin 325 mg daily  and atorvastatin  for secondary stroke prevention  Continue to follow up with PCP regarding cholesterol and blood pressure management  Maintain strict control of hypertension with blood pressure goal below 130/90 and cholesterol with LDL cholesterol (bad cholesterol) goal below 70 mg/dL.   Recommend working with therapies - would recommend doing outpatient therapies if able as this can provide greater benefit for possible further recovery      Followup in the future with me in 6 months or call earlier if needed       Thank you for coming to see [place] at [facility]. I hope we have been able to provide you high quality care today.  You may receive a patient satisfaction survey over the next few weeks. We would appreciate your feedback and comments so that we may continue to improve ourselves and the health of our patients.

## 2020-11-24 DIAGNOSIS — G8112 Spastic hemiplegia affecting left dominant side: Secondary | ICD-10-CM | POA: Diagnosis not present

## 2020-11-24 DIAGNOSIS — Z8673 Personal history of transient ischemic attack (TIA), and cerebral infarction without residual deficits: Secondary | ICD-10-CM | POA: Diagnosis not present

## 2020-11-27 DIAGNOSIS — I63511 Cerebral infarction due to unspecified occlusion or stenosis of right middle cerebral artery: Secondary | ICD-10-CM | POA: Diagnosis not present

## 2020-11-27 DIAGNOSIS — M6281 Muscle weakness (generalized): Secondary | ICD-10-CM | POA: Diagnosis not present

## 2020-11-27 DIAGNOSIS — I69354 Hemiplegia and hemiparesis following cerebral infarction affecting left non-dominant side: Secondary | ICD-10-CM | POA: Diagnosis not present

## 2020-11-28 DIAGNOSIS — E785 Hyperlipidemia, unspecified: Secondary | ICD-10-CM | POA: Diagnosis not present

## 2020-11-28 DIAGNOSIS — G8112 Spastic hemiplegia affecting left dominant side: Secondary | ICD-10-CM | POA: Diagnosis not present

## 2020-11-28 DIAGNOSIS — Z8673 Personal history of transient ischemic attack (TIA), and cerebral infarction without residual deficits: Secondary | ICD-10-CM | POA: Diagnosis not present

## 2020-12-01 DIAGNOSIS — Z8673 Personal history of transient ischemic attack (TIA), and cerebral infarction without residual deficits: Secondary | ICD-10-CM | POA: Diagnosis not present

## 2020-12-01 DIAGNOSIS — G8112 Spastic hemiplegia affecting left dominant side: Secondary | ICD-10-CM | POA: Diagnosis not present

## 2020-12-05 DIAGNOSIS — G8112 Spastic hemiplegia affecting left dominant side: Secondary | ICD-10-CM | POA: Diagnosis not present

## 2020-12-05 DIAGNOSIS — Z8673 Personal history of transient ischemic attack (TIA), and cerebral infarction without residual deficits: Secondary | ICD-10-CM | POA: Diagnosis not present

## 2020-12-08 DIAGNOSIS — G8112 Spastic hemiplegia affecting left dominant side: Secondary | ICD-10-CM | POA: Diagnosis not present

## 2020-12-08 DIAGNOSIS — Z8673 Personal history of transient ischemic attack (TIA), and cerebral infarction without residual deficits: Secondary | ICD-10-CM | POA: Diagnosis not present

## 2020-12-12 DIAGNOSIS — G8112 Spastic hemiplegia affecting left dominant side: Secondary | ICD-10-CM | POA: Diagnosis not present

## 2020-12-12 DIAGNOSIS — I679 Cerebrovascular disease, unspecified: Secondary | ICD-10-CM | POA: Diagnosis not present

## 2020-12-12 DIAGNOSIS — I10 Essential (primary) hypertension: Secondary | ICD-10-CM | POA: Diagnosis not present

## 2020-12-12 DIAGNOSIS — Z8673 Personal history of transient ischemic attack (TIA), and cerebral infarction without residual deficits: Secondary | ICD-10-CM | POA: Diagnosis not present

## 2020-12-12 DIAGNOSIS — G8194 Hemiplegia, unspecified affecting left nondominant side: Secondary | ICD-10-CM | POA: Diagnosis not present

## 2020-12-12 DIAGNOSIS — E785 Hyperlipidemia, unspecified: Secondary | ICD-10-CM | POA: Diagnosis not present

## 2020-12-15 DIAGNOSIS — G8112 Spastic hemiplegia affecting left dominant side: Secondary | ICD-10-CM | POA: Diagnosis not present

## 2020-12-15 DIAGNOSIS — Z8673 Personal history of transient ischemic attack (TIA), and cerebral infarction without residual deficits: Secondary | ICD-10-CM | POA: Diagnosis not present

## 2020-12-19 DIAGNOSIS — R278 Other lack of coordination: Secondary | ICD-10-CM | POA: Diagnosis not present

## 2020-12-19 DIAGNOSIS — I69354 Hemiplegia and hemiparesis following cerebral infarction affecting left non-dominant side: Secondary | ICD-10-CM | POA: Diagnosis not present

## 2020-12-19 DIAGNOSIS — R208 Other disturbances of skin sensation: Secondary | ICD-10-CM | POA: Diagnosis not present

## 2020-12-19 DIAGNOSIS — H538 Other visual disturbances: Secondary | ICD-10-CM | POA: Diagnosis not present

## 2020-12-19 DIAGNOSIS — M6281 Muscle weakness (generalized): Secondary | ICD-10-CM | POA: Diagnosis not present

## 2020-12-22 DIAGNOSIS — R208 Other disturbances of skin sensation: Secondary | ICD-10-CM | POA: Diagnosis not present

## 2020-12-22 DIAGNOSIS — R278 Other lack of coordination: Secondary | ICD-10-CM | POA: Diagnosis not present

## 2020-12-22 DIAGNOSIS — I69354 Hemiplegia and hemiparesis following cerebral infarction affecting left non-dominant side: Secondary | ICD-10-CM | POA: Diagnosis not present

## 2020-12-22 DIAGNOSIS — H538 Other visual disturbances: Secondary | ICD-10-CM | POA: Diagnosis not present

## 2020-12-22 DIAGNOSIS — M6281 Muscle weakness (generalized): Secondary | ICD-10-CM | POA: Diagnosis not present

## 2020-12-26 DIAGNOSIS — R278 Other lack of coordination: Secondary | ICD-10-CM | POA: Diagnosis not present

## 2020-12-26 DIAGNOSIS — I69354 Hemiplegia and hemiparesis following cerebral infarction affecting left non-dominant side: Secondary | ICD-10-CM | POA: Diagnosis not present

## 2020-12-26 DIAGNOSIS — R208 Other disturbances of skin sensation: Secondary | ICD-10-CM | POA: Diagnosis not present

## 2020-12-26 DIAGNOSIS — H538 Other visual disturbances: Secondary | ICD-10-CM | POA: Diagnosis not present

## 2020-12-26 DIAGNOSIS — M6281 Muscle weakness (generalized): Secondary | ICD-10-CM | POA: Diagnosis not present

## 2020-12-28 DIAGNOSIS — I63511 Cerebral infarction due to unspecified occlusion or stenosis of right middle cerebral artery: Secondary | ICD-10-CM | POA: Diagnosis not present

## 2020-12-28 DIAGNOSIS — M6281 Muscle weakness (generalized): Secondary | ICD-10-CM | POA: Diagnosis not present

## 2020-12-28 DIAGNOSIS — I69354 Hemiplegia and hemiparesis following cerebral infarction affecting left non-dominant side: Secondary | ICD-10-CM | POA: Diagnosis not present

## 2020-12-29 DIAGNOSIS — H538 Other visual disturbances: Secondary | ICD-10-CM | POA: Diagnosis not present

## 2020-12-29 DIAGNOSIS — R208 Other disturbances of skin sensation: Secondary | ICD-10-CM | POA: Diagnosis not present

## 2020-12-29 DIAGNOSIS — M6281 Muscle weakness (generalized): Secondary | ICD-10-CM | POA: Diagnosis not present

## 2020-12-29 DIAGNOSIS — R278 Other lack of coordination: Secondary | ICD-10-CM | POA: Diagnosis not present

## 2020-12-29 DIAGNOSIS — I69354 Hemiplegia and hemiparesis following cerebral infarction affecting left non-dominant side: Secondary | ICD-10-CM | POA: Diagnosis not present

## 2021-01-02 DIAGNOSIS — I69354 Hemiplegia and hemiparesis following cerebral infarction affecting left non-dominant side: Secondary | ICD-10-CM | POA: Diagnosis not present

## 2021-01-02 DIAGNOSIS — H538 Other visual disturbances: Secondary | ICD-10-CM | POA: Diagnosis not present

## 2021-01-02 DIAGNOSIS — R278 Other lack of coordination: Secondary | ICD-10-CM | POA: Diagnosis not present

## 2021-01-02 DIAGNOSIS — R208 Other disturbances of skin sensation: Secondary | ICD-10-CM | POA: Diagnosis not present

## 2021-01-02 DIAGNOSIS — M6281 Muscle weakness (generalized): Secondary | ICD-10-CM | POA: Diagnosis not present

## 2021-01-05 DIAGNOSIS — R208 Other disturbances of skin sensation: Secondary | ICD-10-CM | POA: Diagnosis not present

## 2021-01-05 DIAGNOSIS — R278 Other lack of coordination: Secondary | ICD-10-CM | POA: Diagnosis not present

## 2021-01-05 DIAGNOSIS — I69354 Hemiplegia and hemiparesis following cerebral infarction affecting left non-dominant side: Secondary | ICD-10-CM | POA: Diagnosis not present

## 2021-01-05 DIAGNOSIS — H538 Other visual disturbances: Secondary | ICD-10-CM | POA: Diagnosis not present

## 2021-01-05 DIAGNOSIS — M6281 Muscle weakness (generalized): Secondary | ICD-10-CM | POA: Diagnosis not present

## 2021-01-13 DIAGNOSIS — I69354 Hemiplegia and hemiparesis following cerebral infarction affecting left non-dominant side: Secondary | ICD-10-CM | POA: Diagnosis not present

## 2021-01-13 DIAGNOSIS — R278 Other lack of coordination: Secondary | ICD-10-CM | POA: Diagnosis not present

## 2021-01-13 DIAGNOSIS — R208 Other disturbances of skin sensation: Secondary | ICD-10-CM | POA: Diagnosis not present

## 2021-01-13 DIAGNOSIS — H538 Other visual disturbances: Secondary | ICD-10-CM | POA: Diagnosis not present

## 2021-01-13 DIAGNOSIS — M6281 Muscle weakness (generalized): Secondary | ICD-10-CM | POA: Diagnosis not present

## 2021-01-14 DIAGNOSIS — E785 Hyperlipidemia, unspecified: Secondary | ICD-10-CM | POA: Diagnosis not present

## 2021-01-16 DIAGNOSIS — R278 Other lack of coordination: Secondary | ICD-10-CM | POA: Diagnosis not present

## 2021-01-16 DIAGNOSIS — M6281 Muscle weakness (generalized): Secondary | ICD-10-CM | POA: Diagnosis not present

## 2021-01-16 DIAGNOSIS — R208 Other disturbances of skin sensation: Secondary | ICD-10-CM | POA: Diagnosis not present

## 2021-01-16 DIAGNOSIS — R2681 Unsteadiness on feet: Secondary | ICD-10-CM | POA: Diagnosis not present

## 2021-01-16 DIAGNOSIS — H538 Other visual disturbances: Secondary | ICD-10-CM | POA: Diagnosis not present

## 2021-01-16 DIAGNOSIS — I69354 Hemiplegia and hemiparesis following cerebral infarction affecting left non-dominant side: Secondary | ICD-10-CM | POA: Diagnosis not present

## 2021-01-19 DIAGNOSIS — I69354 Hemiplegia and hemiparesis following cerebral infarction affecting left non-dominant side: Secondary | ICD-10-CM | POA: Diagnosis not present

## 2021-01-19 DIAGNOSIS — R278 Other lack of coordination: Secondary | ICD-10-CM | POA: Diagnosis not present

## 2021-01-19 DIAGNOSIS — R208 Other disturbances of skin sensation: Secondary | ICD-10-CM | POA: Diagnosis not present

## 2021-01-19 DIAGNOSIS — R2681 Unsteadiness on feet: Secondary | ICD-10-CM | POA: Diagnosis not present

## 2021-01-19 DIAGNOSIS — M6281 Muscle weakness (generalized): Secondary | ICD-10-CM | POA: Diagnosis not present

## 2021-01-19 DIAGNOSIS — H538 Other visual disturbances: Secondary | ICD-10-CM | POA: Diagnosis not present

## 2021-01-23 DIAGNOSIS — R278 Other lack of coordination: Secondary | ICD-10-CM | POA: Diagnosis not present

## 2021-01-23 DIAGNOSIS — M6281 Muscle weakness (generalized): Secondary | ICD-10-CM | POA: Diagnosis not present

## 2021-01-23 DIAGNOSIS — R208 Other disturbances of skin sensation: Secondary | ICD-10-CM | POA: Diagnosis not present

## 2021-01-23 DIAGNOSIS — I69354 Hemiplegia and hemiparesis following cerebral infarction affecting left non-dominant side: Secondary | ICD-10-CM | POA: Diagnosis not present

## 2021-01-23 DIAGNOSIS — R2681 Unsteadiness on feet: Secondary | ICD-10-CM | POA: Diagnosis not present

## 2021-01-23 DIAGNOSIS — H538 Other visual disturbances: Secondary | ICD-10-CM | POA: Diagnosis not present

## 2021-01-27 DIAGNOSIS — I69354 Hemiplegia and hemiparesis following cerebral infarction affecting left non-dominant side: Secondary | ICD-10-CM | POA: Diagnosis not present

## 2021-01-27 DIAGNOSIS — M6281 Muscle weakness (generalized): Secondary | ICD-10-CM | POA: Diagnosis not present

## 2021-01-27 DIAGNOSIS — R2681 Unsteadiness on feet: Secondary | ICD-10-CM | POA: Diagnosis not present

## 2021-01-27 DIAGNOSIS — R278 Other lack of coordination: Secondary | ICD-10-CM | POA: Diagnosis not present

## 2021-01-27 DIAGNOSIS — R208 Other disturbances of skin sensation: Secondary | ICD-10-CM | POA: Diagnosis not present

## 2021-01-27 DIAGNOSIS — H538 Other visual disturbances: Secondary | ICD-10-CM | POA: Diagnosis not present

## 2021-01-30 DIAGNOSIS — R2681 Unsteadiness on feet: Secondary | ICD-10-CM | POA: Diagnosis not present

## 2021-01-30 DIAGNOSIS — H538 Other visual disturbances: Secondary | ICD-10-CM | POA: Diagnosis not present

## 2021-01-30 DIAGNOSIS — R208 Other disturbances of skin sensation: Secondary | ICD-10-CM | POA: Diagnosis not present

## 2021-01-30 DIAGNOSIS — M6281 Muscle weakness (generalized): Secondary | ICD-10-CM | POA: Diagnosis not present

## 2021-01-30 DIAGNOSIS — I69354 Hemiplegia and hemiparesis following cerebral infarction affecting left non-dominant side: Secondary | ICD-10-CM | POA: Diagnosis not present

## 2021-01-30 DIAGNOSIS — R278 Other lack of coordination: Secondary | ICD-10-CM | POA: Diagnosis not present

## 2021-02-03 DIAGNOSIS — I69354 Hemiplegia and hemiparesis following cerebral infarction affecting left non-dominant side: Secondary | ICD-10-CM | POA: Diagnosis not present

## 2021-02-03 DIAGNOSIS — R208 Other disturbances of skin sensation: Secondary | ICD-10-CM | POA: Diagnosis not present

## 2021-02-03 DIAGNOSIS — R278 Other lack of coordination: Secondary | ICD-10-CM | POA: Diagnosis not present

## 2021-02-03 DIAGNOSIS — M6281 Muscle weakness (generalized): Secondary | ICD-10-CM | POA: Diagnosis not present

## 2021-02-03 DIAGNOSIS — R2681 Unsteadiness on feet: Secondary | ICD-10-CM | POA: Diagnosis not present

## 2021-02-03 DIAGNOSIS — H538 Other visual disturbances: Secondary | ICD-10-CM | POA: Diagnosis not present

## 2021-02-04 DIAGNOSIS — R6889 Other general symptoms and signs: Secondary | ICD-10-CM | POA: Diagnosis not present

## 2021-02-04 DIAGNOSIS — N401 Enlarged prostate with lower urinary tract symptoms: Secondary | ICD-10-CM | POA: Diagnosis not present

## 2021-02-04 DIAGNOSIS — R338 Other retention of urine: Secondary | ICD-10-CM | POA: Diagnosis not present

## 2021-02-04 DIAGNOSIS — R351 Nocturia: Secondary | ICD-10-CM | POA: Diagnosis not present

## 2021-02-04 DIAGNOSIS — N400 Enlarged prostate without lower urinary tract symptoms: Secondary | ICD-10-CM | POA: Diagnosis not present

## 2021-02-11 DIAGNOSIS — M6281 Muscle weakness (generalized): Secondary | ICD-10-CM | POA: Diagnosis not present

## 2021-02-11 DIAGNOSIS — H538 Other visual disturbances: Secondary | ICD-10-CM | POA: Diagnosis not present

## 2021-02-11 DIAGNOSIS — R278 Other lack of coordination: Secondary | ICD-10-CM | POA: Diagnosis not present

## 2021-02-11 DIAGNOSIS — R208 Other disturbances of skin sensation: Secondary | ICD-10-CM | POA: Diagnosis not present

## 2021-02-11 DIAGNOSIS — I69354 Hemiplegia and hemiparesis following cerebral infarction affecting left non-dominant side: Secondary | ICD-10-CM | POA: Diagnosis not present

## 2021-02-11 DIAGNOSIS — R2681 Unsteadiness on feet: Secondary | ICD-10-CM | POA: Diagnosis not present

## 2021-02-18 DIAGNOSIS — R42 Dizziness and giddiness: Secondary | ICD-10-CM | POA: Diagnosis not present

## 2021-02-18 DIAGNOSIS — I69354 Hemiplegia and hemiparesis following cerebral infarction affecting left non-dominant side: Secondary | ICD-10-CM | POA: Diagnosis not present

## 2021-02-18 DIAGNOSIS — R208 Other disturbances of skin sensation: Secondary | ICD-10-CM | POA: Diagnosis not present

## 2021-02-18 DIAGNOSIS — H538 Other visual disturbances: Secondary | ICD-10-CM | POA: Diagnosis not present

## 2021-02-18 DIAGNOSIS — R2681 Unsteadiness on feet: Secondary | ICD-10-CM | POA: Diagnosis not present

## 2021-02-18 DIAGNOSIS — M6281 Muscle weakness (generalized): Secondary | ICD-10-CM | POA: Diagnosis not present

## 2021-02-20 DIAGNOSIS — R42 Dizziness and giddiness: Secondary | ICD-10-CM | POA: Diagnosis not present

## 2021-02-20 DIAGNOSIS — I69354 Hemiplegia and hemiparesis following cerebral infarction affecting left non-dominant side: Secondary | ICD-10-CM | POA: Diagnosis not present

## 2021-02-20 DIAGNOSIS — R208 Other disturbances of skin sensation: Secondary | ICD-10-CM | POA: Diagnosis not present

## 2021-02-20 DIAGNOSIS — M6281 Muscle weakness (generalized): Secondary | ICD-10-CM | POA: Diagnosis not present

## 2021-02-20 DIAGNOSIS — R2681 Unsteadiness on feet: Secondary | ICD-10-CM | POA: Diagnosis not present

## 2021-02-20 DIAGNOSIS — H538 Other visual disturbances: Secondary | ICD-10-CM | POA: Diagnosis not present

## 2021-02-26 DIAGNOSIS — M6281 Muscle weakness (generalized): Secondary | ICD-10-CM | POA: Diagnosis not present

## 2021-02-26 DIAGNOSIS — R42 Dizziness and giddiness: Secondary | ICD-10-CM | POA: Diagnosis not present

## 2021-02-26 DIAGNOSIS — R2681 Unsteadiness on feet: Secondary | ICD-10-CM | POA: Diagnosis not present

## 2021-02-26 DIAGNOSIS — R208 Other disturbances of skin sensation: Secondary | ICD-10-CM | POA: Diagnosis not present

## 2021-02-26 DIAGNOSIS — I69354 Hemiplegia and hemiparesis following cerebral infarction affecting left non-dominant side: Secondary | ICD-10-CM | POA: Diagnosis not present

## 2021-02-26 DIAGNOSIS — H538 Other visual disturbances: Secondary | ICD-10-CM | POA: Diagnosis not present

## 2021-02-27 DIAGNOSIS — R2681 Unsteadiness on feet: Secondary | ICD-10-CM | POA: Diagnosis not present

## 2021-02-27 DIAGNOSIS — M6281 Muscle weakness (generalized): Secondary | ICD-10-CM | POA: Diagnosis not present

## 2021-02-27 DIAGNOSIS — R42 Dizziness and giddiness: Secondary | ICD-10-CM | POA: Diagnosis not present

## 2021-02-27 DIAGNOSIS — I69354 Hemiplegia and hemiparesis following cerebral infarction affecting left non-dominant side: Secondary | ICD-10-CM | POA: Diagnosis not present

## 2021-02-27 DIAGNOSIS — R208 Other disturbances of skin sensation: Secondary | ICD-10-CM | POA: Diagnosis not present

## 2021-02-27 DIAGNOSIS — H538 Other visual disturbances: Secondary | ICD-10-CM | POA: Diagnosis not present

## 2021-03-02 DIAGNOSIS — R208 Other disturbances of skin sensation: Secondary | ICD-10-CM | POA: Diagnosis not present

## 2021-03-02 DIAGNOSIS — I69354 Hemiplegia and hemiparesis following cerebral infarction affecting left non-dominant side: Secondary | ICD-10-CM | POA: Diagnosis not present

## 2021-03-02 DIAGNOSIS — R2681 Unsteadiness on feet: Secondary | ICD-10-CM | POA: Diagnosis not present

## 2021-03-02 DIAGNOSIS — R42 Dizziness and giddiness: Secondary | ICD-10-CM | POA: Diagnosis not present

## 2021-03-02 DIAGNOSIS — M6281 Muscle weakness (generalized): Secondary | ICD-10-CM | POA: Diagnosis not present

## 2021-03-02 DIAGNOSIS — H538 Other visual disturbances: Secondary | ICD-10-CM | POA: Diagnosis not present

## 2021-03-04 DIAGNOSIS — H538 Other visual disturbances: Secondary | ICD-10-CM | POA: Diagnosis not present

## 2021-03-04 DIAGNOSIS — I69354 Hemiplegia and hemiparesis following cerebral infarction affecting left non-dominant side: Secondary | ICD-10-CM | POA: Diagnosis not present

## 2021-03-04 DIAGNOSIS — M6281 Muscle weakness (generalized): Secondary | ICD-10-CM | POA: Diagnosis not present

## 2021-03-04 DIAGNOSIS — R208 Other disturbances of skin sensation: Secondary | ICD-10-CM | POA: Diagnosis not present

## 2021-03-04 DIAGNOSIS — R2681 Unsteadiness on feet: Secondary | ICD-10-CM | POA: Diagnosis not present

## 2021-03-04 DIAGNOSIS — R42 Dizziness and giddiness: Secondary | ICD-10-CM | POA: Diagnosis not present

## 2021-03-13 DIAGNOSIS — R208 Other disturbances of skin sensation: Secondary | ICD-10-CM | POA: Diagnosis not present

## 2021-03-13 DIAGNOSIS — I69354 Hemiplegia and hemiparesis following cerebral infarction affecting left non-dominant side: Secondary | ICD-10-CM | POA: Diagnosis not present

## 2021-03-13 DIAGNOSIS — R42 Dizziness and giddiness: Secondary | ICD-10-CM | POA: Diagnosis not present

## 2021-03-13 DIAGNOSIS — M6281 Muscle weakness (generalized): Secondary | ICD-10-CM | POA: Diagnosis not present

## 2021-03-13 DIAGNOSIS — H538 Other visual disturbances: Secondary | ICD-10-CM | POA: Diagnosis not present

## 2021-03-13 DIAGNOSIS — R2681 Unsteadiness on feet: Secondary | ICD-10-CM | POA: Diagnosis not present

## 2021-03-16 DIAGNOSIS — H538 Other visual disturbances: Secondary | ICD-10-CM | POA: Diagnosis not present

## 2021-03-16 DIAGNOSIS — R208 Other disturbances of skin sensation: Secondary | ICD-10-CM | POA: Diagnosis not present

## 2021-03-16 DIAGNOSIS — M6281 Muscle weakness (generalized): Secondary | ICD-10-CM | POA: Diagnosis not present

## 2021-03-16 DIAGNOSIS — R42 Dizziness and giddiness: Secondary | ICD-10-CM | POA: Diagnosis not present

## 2021-03-16 DIAGNOSIS — R2681 Unsteadiness on feet: Secondary | ICD-10-CM | POA: Diagnosis not present

## 2021-03-16 DIAGNOSIS — I69354 Hemiplegia and hemiparesis following cerebral infarction affecting left non-dominant side: Secondary | ICD-10-CM | POA: Diagnosis not present

## 2021-03-18 DIAGNOSIS — R2681 Unsteadiness on feet: Secondary | ICD-10-CM | POA: Diagnosis not present

## 2021-03-18 DIAGNOSIS — M6281 Muscle weakness (generalized): Secondary | ICD-10-CM | POA: Diagnosis not present

## 2021-03-18 DIAGNOSIS — R42 Dizziness and giddiness: Secondary | ICD-10-CM | POA: Diagnosis not present

## 2021-03-18 DIAGNOSIS — R208 Other disturbances of skin sensation: Secondary | ICD-10-CM | POA: Diagnosis not present

## 2021-03-18 DIAGNOSIS — H538 Other visual disturbances: Secondary | ICD-10-CM | POA: Diagnosis not present

## 2021-03-18 DIAGNOSIS — I69354 Hemiplegia and hemiparesis following cerebral infarction affecting left non-dominant side: Secondary | ICD-10-CM | POA: Diagnosis not present

## 2021-03-24 DIAGNOSIS — Z20822 Contact with and (suspected) exposure to covid-19: Secondary | ICD-10-CM | POA: Diagnosis not present

## 2021-03-24 DIAGNOSIS — Z8673 Personal history of transient ischemic attack (TIA), and cerebral infarction without residual deficits: Secondary | ICD-10-CM | POA: Diagnosis not present

## 2021-03-24 DIAGNOSIS — R6883 Chills (without fever): Secondary | ICD-10-CM | POA: Diagnosis not present

## 2021-03-24 DIAGNOSIS — Z6827 Body mass index (BMI) 27.0-27.9, adult: Secondary | ICD-10-CM | POA: Diagnosis not present

## 2021-03-24 DIAGNOSIS — R569 Unspecified convulsions: Secondary | ICD-10-CM | POA: Diagnosis not present

## 2021-03-25 DIAGNOSIS — Z8673 Personal history of transient ischemic attack (TIA), and cerebral infarction without residual deficits: Secondary | ICD-10-CM | POA: Diagnosis not present

## 2021-03-25 DIAGNOSIS — M6281 Muscle weakness (generalized): Secondary | ICD-10-CM | POA: Diagnosis not present

## 2021-03-25 DIAGNOSIS — R2681 Unsteadiness on feet: Secondary | ICD-10-CM | POA: Diagnosis not present

## 2021-03-25 DIAGNOSIS — H538 Other visual disturbances: Secondary | ICD-10-CM | POA: Diagnosis not present

## 2021-03-25 DIAGNOSIS — I69354 Hemiplegia and hemiparesis following cerebral infarction affecting left non-dominant side: Secondary | ICD-10-CM | POA: Diagnosis not present

## 2021-03-25 DIAGNOSIS — R208 Other disturbances of skin sensation: Secondary | ICD-10-CM | POA: Diagnosis not present

## 2021-03-25 DIAGNOSIS — R42 Dizziness and giddiness: Secondary | ICD-10-CM | POA: Diagnosis not present

## 2021-03-27 DIAGNOSIS — H538 Other visual disturbances: Secondary | ICD-10-CM | POA: Diagnosis not present

## 2021-03-27 DIAGNOSIS — R208 Other disturbances of skin sensation: Secondary | ICD-10-CM | POA: Diagnosis not present

## 2021-03-27 DIAGNOSIS — R2681 Unsteadiness on feet: Secondary | ICD-10-CM | POA: Diagnosis not present

## 2021-03-27 DIAGNOSIS — M6281 Muscle weakness (generalized): Secondary | ICD-10-CM | POA: Diagnosis not present

## 2021-03-27 DIAGNOSIS — I69354 Hemiplegia and hemiparesis following cerebral infarction affecting left non-dominant side: Secondary | ICD-10-CM | POA: Diagnosis not present

## 2021-03-27 DIAGNOSIS — R42 Dizziness and giddiness: Secondary | ICD-10-CM | POA: Diagnosis not present

## 2021-03-31 DIAGNOSIS — R42 Dizziness and giddiness: Secondary | ICD-10-CM | POA: Diagnosis not present

## 2021-03-31 DIAGNOSIS — Z8673 Personal history of transient ischemic attack (TIA), and cerebral infarction without residual deficits: Secondary | ICD-10-CM | POA: Diagnosis not present

## 2021-04-01 DIAGNOSIS — R2681 Unsteadiness on feet: Secondary | ICD-10-CM | POA: Diagnosis not present

## 2021-04-01 DIAGNOSIS — R208 Other disturbances of skin sensation: Secondary | ICD-10-CM | POA: Diagnosis not present

## 2021-04-01 DIAGNOSIS — I69354 Hemiplegia and hemiparesis following cerebral infarction affecting left non-dominant side: Secondary | ICD-10-CM | POA: Diagnosis not present

## 2021-04-01 DIAGNOSIS — H538 Other visual disturbances: Secondary | ICD-10-CM | POA: Diagnosis not present

## 2021-04-01 DIAGNOSIS — M6281 Muscle weakness (generalized): Secondary | ICD-10-CM | POA: Diagnosis not present

## 2021-04-01 DIAGNOSIS — R42 Dizziness and giddiness: Secondary | ICD-10-CM | POA: Diagnosis not present

## 2021-04-03 DIAGNOSIS — I69354 Hemiplegia and hemiparesis following cerebral infarction affecting left non-dominant side: Secondary | ICD-10-CM | POA: Diagnosis not present

## 2021-04-03 DIAGNOSIS — R2681 Unsteadiness on feet: Secondary | ICD-10-CM | POA: Diagnosis not present

## 2021-04-03 DIAGNOSIS — R208 Other disturbances of skin sensation: Secondary | ICD-10-CM | POA: Diagnosis not present

## 2021-04-03 DIAGNOSIS — H538 Other visual disturbances: Secondary | ICD-10-CM | POA: Diagnosis not present

## 2021-04-03 DIAGNOSIS — R42 Dizziness and giddiness: Secondary | ICD-10-CM | POA: Diagnosis not present

## 2021-04-03 DIAGNOSIS — M6281 Muscle weakness (generalized): Secondary | ICD-10-CM | POA: Diagnosis not present

## 2021-04-07 DIAGNOSIS — H538 Other visual disturbances: Secondary | ICD-10-CM | POA: Diagnosis not present

## 2021-04-07 DIAGNOSIS — R2681 Unsteadiness on feet: Secondary | ICD-10-CM | POA: Diagnosis not present

## 2021-04-07 DIAGNOSIS — I69354 Hemiplegia and hemiparesis following cerebral infarction affecting left non-dominant side: Secondary | ICD-10-CM | POA: Diagnosis not present

## 2021-04-07 DIAGNOSIS — M6281 Muscle weakness (generalized): Secondary | ICD-10-CM | POA: Diagnosis not present

## 2021-04-07 DIAGNOSIS — R208 Other disturbances of skin sensation: Secondary | ICD-10-CM | POA: Diagnosis not present

## 2021-04-07 DIAGNOSIS — R42 Dizziness and giddiness: Secondary | ICD-10-CM | POA: Diagnosis not present

## 2021-04-09 DIAGNOSIS — E785 Hyperlipidemia, unspecified: Secondary | ICD-10-CM | POA: Diagnosis not present

## 2021-04-09 DIAGNOSIS — I679 Cerebrovascular disease, unspecified: Secondary | ICD-10-CM | POA: Diagnosis not present

## 2021-04-09 DIAGNOSIS — F32A Depression, unspecified: Secondary | ICD-10-CM | POA: Diagnosis not present

## 2021-04-09 DIAGNOSIS — I10 Essential (primary) hypertension: Secondary | ICD-10-CM | POA: Diagnosis not present

## 2021-04-10 DIAGNOSIS — M6281 Muscle weakness (generalized): Secondary | ICD-10-CM | POA: Diagnosis not present

## 2021-04-10 DIAGNOSIS — R42 Dizziness and giddiness: Secondary | ICD-10-CM | POA: Diagnosis not present

## 2021-04-10 DIAGNOSIS — R2681 Unsteadiness on feet: Secondary | ICD-10-CM | POA: Diagnosis not present

## 2021-04-10 DIAGNOSIS — I69354 Hemiplegia and hemiparesis following cerebral infarction affecting left non-dominant side: Secondary | ICD-10-CM | POA: Diagnosis not present

## 2021-04-10 DIAGNOSIS — R208 Other disturbances of skin sensation: Secondary | ICD-10-CM | POA: Diagnosis not present

## 2021-04-10 DIAGNOSIS — H538 Other visual disturbances: Secondary | ICD-10-CM | POA: Diagnosis not present

## 2021-04-13 DIAGNOSIS — M6281 Muscle weakness (generalized): Secondary | ICD-10-CM | POA: Diagnosis not present

## 2021-04-13 DIAGNOSIS — R2681 Unsteadiness on feet: Secondary | ICD-10-CM | POA: Diagnosis not present

## 2021-04-13 DIAGNOSIS — R208 Other disturbances of skin sensation: Secondary | ICD-10-CM | POA: Diagnosis not present

## 2021-04-13 DIAGNOSIS — H538 Other visual disturbances: Secondary | ICD-10-CM | POA: Diagnosis not present

## 2021-04-13 DIAGNOSIS — I69354 Hemiplegia and hemiparesis following cerebral infarction affecting left non-dominant side: Secondary | ICD-10-CM | POA: Diagnosis not present

## 2021-04-13 DIAGNOSIS — R42 Dizziness and giddiness: Secondary | ICD-10-CM | POA: Diagnosis not present

## 2021-04-14 ENCOUNTER — Ambulatory Visit: Payer: Medicare HMO | Admitting: Adult Health

## 2021-04-15 DIAGNOSIS — R208 Other disturbances of skin sensation: Secondary | ICD-10-CM | POA: Diagnosis not present

## 2021-04-15 DIAGNOSIS — H538 Other visual disturbances: Secondary | ICD-10-CM | POA: Diagnosis not present

## 2021-04-15 DIAGNOSIS — R42 Dizziness and giddiness: Secondary | ICD-10-CM | POA: Diagnosis not present

## 2021-04-15 DIAGNOSIS — I69954 Hemiplegia and hemiparesis following unspecified cerebrovascular disease affecting left non-dominant side: Secondary | ICD-10-CM | POA: Diagnosis not present

## 2021-04-15 DIAGNOSIS — R2681 Unsteadiness on feet: Secondary | ICD-10-CM | POA: Diagnosis not present

## 2021-04-24 DIAGNOSIS — R42 Dizziness and giddiness: Secondary | ICD-10-CM | POA: Diagnosis not present

## 2021-04-24 DIAGNOSIS — R2681 Unsteadiness on feet: Secondary | ICD-10-CM | POA: Diagnosis not present

## 2021-04-24 DIAGNOSIS — H538 Other visual disturbances: Secondary | ICD-10-CM | POA: Diagnosis not present

## 2021-04-24 DIAGNOSIS — R208 Other disturbances of skin sensation: Secondary | ICD-10-CM | POA: Diagnosis not present

## 2021-04-24 DIAGNOSIS — I69954 Hemiplegia and hemiparesis following unspecified cerebrovascular disease affecting left non-dominant side: Secondary | ICD-10-CM | POA: Diagnosis not present

## 2021-04-27 DIAGNOSIS — R42 Dizziness and giddiness: Secondary | ICD-10-CM | POA: Diagnosis not present

## 2021-04-27 DIAGNOSIS — R208 Other disturbances of skin sensation: Secondary | ICD-10-CM | POA: Diagnosis not present

## 2021-04-27 DIAGNOSIS — H538 Other visual disturbances: Secondary | ICD-10-CM | POA: Diagnosis not present

## 2021-04-27 DIAGNOSIS — I69954 Hemiplegia and hemiparesis following unspecified cerebrovascular disease affecting left non-dominant side: Secondary | ICD-10-CM | POA: Diagnosis not present

## 2021-04-27 DIAGNOSIS — R2681 Unsteadiness on feet: Secondary | ICD-10-CM | POA: Diagnosis not present

## 2021-04-30 DIAGNOSIS — Z6827 Body mass index (BMI) 27.0-27.9, adult: Secondary | ICD-10-CM | POA: Diagnosis not present

## 2021-04-30 DIAGNOSIS — R569 Unspecified convulsions: Secondary | ICD-10-CM | POA: Diagnosis not present

## 2021-05-04 DIAGNOSIS — I10 Essential (primary) hypertension: Secondary | ICD-10-CM | POA: Diagnosis not present

## 2021-05-04 DIAGNOSIS — R29898 Other symptoms and signs involving the musculoskeletal system: Secondary | ICD-10-CM | POA: Diagnosis not present

## 2021-05-04 DIAGNOSIS — R531 Weakness: Secondary | ICD-10-CM | POA: Diagnosis not present

## 2021-05-04 DIAGNOSIS — R251 Tremor, unspecified: Secondary | ICD-10-CM | POA: Diagnosis not present

## 2021-05-04 DIAGNOSIS — G252 Other specified forms of tremor: Secondary | ICD-10-CM | POA: Diagnosis not present

## 2021-05-04 DIAGNOSIS — R42 Dizziness and giddiness: Secondary | ICD-10-CM | POA: Diagnosis not present

## 2021-05-06 DIAGNOSIS — R2681 Unsteadiness on feet: Secondary | ICD-10-CM | POA: Diagnosis not present

## 2021-05-06 DIAGNOSIS — R42 Dizziness and giddiness: Secondary | ICD-10-CM | POA: Diagnosis not present

## 2021-05-06 DIAGNOSIS — I69954 Hemiplegia and hemiparesis following unspecified cerebrovascular disease affecting left non-dominant side: Secondary | ICD-10-CM | POA: Diagnosis not present

## 2021-05-06 DIAGNOSIS — R208 Other disturbances of skin sensation: Secondary | ICD-10-CM | POA: Diagnosis not present

## 2021-05-06 DIAGNOSIS — H538 Other visual disturbances: Secondary | ICD-10-CM | POA: Diagnosis not present

## 2021-05-08 DIAGNOSIS — I69954 Hemiplegia and hemiparesis following unspecified cerebrovascular disease affecting left non-dominant side: Secondary | ICD-10-CM | POA: Diagnosis not present

## 2021-05-08 DIAGNOSIS — R42 Dizziness and giddiness: Secondary | ICD-10-CM | POA: Diagnosis not present

## 2021-05-08 DIAGNOSIS — H538 Other visual disturbances: Secondary | ICD-10-CM | POA: Diagnosis not present

## 2021-05-08 DIAGNOSIS — R208 Other disturbances of skin sensation: Secondary | ICD-10-CM | POA: Diagnosis not present

## 2021-05-08 DIAGNOSIS — R2681 Unsteadiness on feet: Secondary | ICD-10-CM | POA: Diagnosis not present

## 2021-05-12 DIAGNOSIS — I69352 Hemiplegia and hemiparesis following cerebral infarction affecting left dominant side: Secondary | ICD-10-CM | POA: Diagnosis not present

## 2021-05-12 DIAGNOSIS — R531 Weakness: Secondary | ICD-10-CM | POA: Diagnosis not present

## 2021-05-12 DIAGNOSIS — M25642 Stiffness of left hand, not elsewhere classified: Secondary | ICD-10-CM | POA: Diagnosis not present

## 2021-05-12 DIAGNOSIS — M25632 Stiffness of left wrist, not elsewhere classified: Secondary | ICD-10-CM | POA: Diagnosis not present

## 2021-05-12 DIAGNOSIS — M25622 Stiffness of left elbow, not elsewhere classified: Secondary | ICD-10-CM | POA: Diagnosis not present

## 2021-05-12 DIAGNOSIS — M25612 Stiffness of left shoulder, not elsewhere classified: Secondary | ICD-10-CM | POA: Diagnosis not present

## 2021-05-13 DIAGNOSIS — H538 Other visual disturbances: Secondary | ICD-10-CM | POA: Diagnosis not present

## 2021-05-13 DIAGNOSIS — R208 Other disturbances of skin sensation: Secondary | ICD-10-CM | POA: Diagnosis not present

## 2021-05-13 DIAGNOSIS — R2681 Unsteadiness on feet: Secondary | ICD-10-CM | POA: Diagnosis not present

## 2021-05-13 DIAGNOSIS — I69954 Hemiplegia and hemiparesis following unspecified cerebrovascular disease affecting left non-dominant side: Secondary | ICD-10-CM | POA: Diagnosis not present

## 2021-05-13 DIAGNOSIS — M25612 Stiffness of left shoulder, not elsewhere classified: Secondary | ICD-10-CM | POA: Diagnosis not present

## 2021-05-13 DIAGNOSIS — I69352 Hemiplegia and hemiparesis following cerebral infarction affecting left dominant side: Secondary | ICD-10-CM | POA: Diagnosis not present

## 2021-05-13 DIAGNOSIS — M25642 Stiffness of left hand, not elsewhere classified: Secondary | ICD-10-CM | POA: Diagnosis not present

## 2021-05-13 DIAGNOSIS — R531 Weakness: Secondary | ICD-10-CM | POA: Diagnosis not present

## 2021-05-13 DIAGNOSIS — M25632 Stiffness of left wrist, not elsewhere classified: Secondary | ICD-10-CM | POA: Diagnosis not present

## 2021-05-13 DIAGNOSIS — M25622 Stiffness of left elbow, not elsewhere classified: Secondary | ICD-10-CM | POA: Diagnosis not present

## 2021-05-13 DIAGNOSIS — R42 Dizziness and giddiness: Secondary | ICD-10-CM | POA: Diagnosis not present

## 2021-05-14 DIAGNOSIS — R251 Tremor, unspecified: Secondary | ICD-10-CM | POA: Diagnosis not present

## 2021-05-14 DIAGNOSIS — Z6827 Body mass index (BMI) 27.0-27.9, adult: Secondary | ICD-10-CM | POA: Diagnosis not present

## 2021-05-14 DIAGNOSIS — F32A Depression, unspecified: Secondary | ICD-10-CM | POA: Diagnosis not present

## 2021-05-14 DIAGNOSIS — Z7689 Persons encountering health services in other specified circumstances: Secondary | ICD-10-CM | POA: Diagnosis not present

## 2021-05-18 ENCOUNTER — Encounter: Payer: Self-pay | Admitting: Adult Health

## 2021-05-18 ENCOUNTER — Ambulatory Visit: Payer: Medicare HMO | Admitting: Adult Health

## 2021-05-18 VITALS — BP 127/79 | HR 88

## 2021-05-18 DIAGNOSIS — R6889 Other general symptoms and signs: Secondary | ICD-10-CM | POA: Diagnosis not present

## 2021-05-18 DIAGNOSIS — R259 Unspecified abnormal involuntary movements: Secondary | ICD-10-CM | POA: Diagnosis not present

## 2021-05-18 DIAGNOSIS — G8114 Spastic hemiplegia affecting left nondominant side: Secondary | ICD-10-CM

## 2021-05-18 DIAGNOSIS — I63411 Cerebral infarction due to embolism of right middle cerebral artery: Secondary | ICD-10-CM

## 2021-05-18 MED ORDER — BACLOFEN 5 MG PO TABS: 5.0000 mg | ORAL_TABLET | Freq: Two times a day (BID) | ORAL | 5 refills | Status: AC

## 2021-05-18 NOTE — Progress Notes (Signed)
?[facility] ?[address] ?. [place] [address] ?[phone] ? ?     STROKE FOLLOW UP NOTE ? ?[name] ?Date of Birth:  07/25/1945 ?Medical Record Number:  [id number]  ? ?Reason for visit: stroke follow up ? ? ? ?SUBJECTIVE: ? ? ?CHIEF COMPLAINT:  ?Chief Complaint  ?Patient presents with  ? Follow-up  ?  RM 3 with wife Arsenio Loader and daughter patricia (has [place] interpreter) ?Pt states things have been bad since last visit, having blurred vision on R eye, dizziness and chills that started about 2 months ago   ? ? ? ?HPI:  ? ?Gregory Osborn is a 76 y.o. 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  Routinely followed in office for stroke follow up.  ? ? ?Update 05/18/2021 JM: Patient returns for 39-month stroke follow-up accompanied by his wife, daughter and [place] interpreter but has new concerns today. PCP placed referral on 3/16 to be evaluated for possible seizures and tremors and was seen at [facility] ED on 3/20 for the same. Reports over the past 2 months, he has been experiencing episodes of whole body tremors (limbs and jaw), vocal tremor, dizziness and OD blurred vision. This occurs multiple times per day (approx 4-5x/day) and typically lasts less than 1 min. No specific triggers. Is aware during episode, does not lose consciousness, able to talk and respond appropriately to questions. Once episode subsides, immediately returned back to baseline without any postictal type symptoms. Denies any other associated symptoms. Denies any presence of headaches or history of seizures.  Denies any changes in medications around the time of onset or  any other changes.  ? ?Of note, while speaking of this topic in office, he had typical event with jerking type movements that started in his abdomen, then went to leg, arm and jaw simultaneously, able to speak but with tremor. Lasted approx 20 seconds then completely returned back to baseline.  ? ?Obtained [facility] hospital notes - did obtain MR brain which reported wallowing degeneration in the brainstem on the right but no acute or new findings.  Lab work largely unremarkable.  Evaluated by teleneurology - unable to determine etiology but noted nonspecific movement complaint and should be presumed to be tardive dyskinesia in absence of any provocative preceding medication.  Low suspicion for seizures.  ? ?Currently working with [name] therapies for continued left-sided weakness with noted improvement. 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 He is currently taking baclofen 5 mg daily although previously prescribed 3 times daily dosing - per daughter, he has always been taking just 1 tab daily.  Also requests signing for AFO brace for left foot drop.  Remains nonambulatory and transfers via wheelchair.  Compliant on aspirin and atorvastatin, denies side effects.  Blood pressure today 122/79.  No further concerns at this time. ? ? ? ? ? ? ?Update 11/17/2020 JM: Returns for 42-month follow-up accompanied by his wife and [place] interpreter.  He continues to reside with family and requires assistance for majority of ADLs.  He underwent PEG tube removal [id number] without complication.  Maintaining regular diet without difficulty but family needs to help feed as he will  try to put too much in his mouth at once which will cause difficulty swallowing.  Wife reports good appetite.  Left hemiplegia - no improvement since prior visit.  Left peripheral impairment stable. He is able to stand but unable to take any steps.  Transfers via wheelchair.   Completed HH therapies - is scheduled for initial PT eval next Monday as [facility].  Denies new stroke/TIA symptoms.  Compliant on aspirin and atorvastatin without side effects.  Blood pressure today 144/78.  ? ?S/p TURP on 10/23/2020 for BPH and urinary retention.  Reports occasional hematuria but otherwise voiding well without continued use of Foley catheter.  Routinely followed by urology Dr. Venia Minks ? ?No further concerns at this time ? ?Update 08/05/2020 JM: Mr. [name] returns for sooner visit due to concern of leaking G-tube.  He is accompanied by his daughter and [facility] interpreter.  He has since returned back home where daughter was told by home health nurse to contact our office due to G-tube concern.  G-tube has not been used in the past few months and is taking all nutrition, fluids and pills by mouth without difficulty.  He continues to work with home health PT reporting improvement since prior visit.  He is able to stand for short period of time but is not able to ambulate.  Continue left hemiplegia and visual impairment.  Foley catheter remains intact and has follow-up with urology next week for possible removal per daughter.  Does endorse occasional slurred speech.  Denies new stroke/TIA symptoms.  Compliant on aspirin and atorvastatin without associated side effects.  Blood pressure today 131/77.  No further concerns at this time. ? ?Initial visit 06/10/2020 JM: [name] is being seen for hospital follow-up accompanied by his daughter and [facility] interpreter. ? ?He continues to reside at [facility] ?Reports residual left-sided weakness, left-sided numbness/tingling, blurred vision and dysphagia.  Does report mild speech difficulty but overall improved.  Currently receiving PT but per daughter and patient, has not received any OT or SLP.  He questions removal of PEG tube as he is eating a modified diet without difficulty and taking pills whole by mouth - PEG tube not  currently being used.  He remains nonambulatory and uses Hoyer lift for transfers.  Daughter questions reason why he needs to continue residing at [facility] as she was initially told he would likely be there for 2 months and as he is only receiving PT, she questions benefit.  Denies new or worsening stroke/TIA symptoms ? ?Per review of facility [facility], remains on aspirin 325 mg daily and atorvastatin 40 mg daily without associated side effects ?Blood pressure today 137/77 - unknown levels at facility but per [facility], not currently receiving antihypertensives ? ?Foley catheter remains in place with clear yellow urine draining -continues to follow with urology ?No further concerns at this time ? ?Stroke admission 04/01/2020 ?Krystal Muratalla is a 76 y.o. male with a history of recent urologic issues including UTI (01/22), urinary retention with foley cath placed, hematuria (likely traumatic due to self cath) s/p cystoscopy 04/01/20 by Dr. Felipa Eth [facility]) showing BPH with recommended surgical treatment. Surgical history includes appendectomy. He had been doing well up until 11:25am on 2/16 when he was noted to develop left hemiplegia. EMS was contacted, and took him to the [facility] ED where stroke workup was undertaken. He was noted to have left hemiplegia and neglect with NIHSS score 15.  Shortly transferred to [facility].  Personally reviewed hospitalization pertinent  progress notes, lab work and imaging with summary provided.  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  Repeat CT head 2/19 showed worsening shift and edema with MLS 26mm and 2/23 right MCA large infarct with MLS 64mm.  Evaluated by neurosurgery but patient deemed not to be a surgical candidate.  Recommend consideration of 30-day cardiac event monitor outpatient to rule out A. fib.  Initiated aspirin 325 mg daily for secondary stroke prevention.  LDL 118 -started  atorvastatin 40 mg daily.  Other stroke risk factors include EtOH use but no prior stroke history.  PEG tube placed 3/8 in setting of dysphagia and poor p.o. intake.  Evaluated by therapies and recommend [facility]

## 2021-05-18 NOTE — Patient Instructions (Signed)
Continue working with therapies at [facility]  ? ?We will obtain information from recent hospitalization and will call if additional testing is needed  ? ?Increase baclofen to 5mg  twice daily to help with spasticity and tremors - referral will be placed to evaluate for potential benefit with botox  ? ?Continue aspirin 81 mg daily  and atorvastatin  for secondary stroke prevention ? ?Continue to follow up with PCP regarding cholesterol and blood pressure management  ?Maintain strict control of hypertension with blood pressure goal below 130/90 and cholesterol with LDL cholesterol (bad cholesterol) goal below 70 mg/dL.  ? ?Signs of a Stroke? Follow the BEFAST method:  ?Balance Watch for a sudden loss of balance, trouble with coordination or vertigo ?Eyes Is there a sudden loss of vision in one or both eyes? Or double vision?  ?Face: Ask the person to smile. Does one side of the face droop or is it numb?  ?Arms: Ask the person to raise both arms. Does one arm drift downward? Is there weakness or numbness of a leg? ?Speech: Ask the person to repeat a simple phrase. Does the speech sound slurred/strange? Is the person confused ? ?Time: If you observe any of these signs, call 911. ? ? ? ?Followup in the future with me in 2-3 months with Dr. or call earlier if needed ? ? ? ? ? ? ?Thank you for coming to see Gregory Osborn at [facility]. I hope we have been able to provide you high quality care today. ? ?You may receive a patient satisfaction survey over the next few weeks. We would appreciate your feedback and comments so that we may continue to improve ourselves and the health of our patients. ? ?

## 2021-05-19 ENCOUNTER — Encounter: Payer: Self-pay | Admitting: Adult Health

## 2021-05-19 DIAGNOSIS — G8192 Hemiplegia, unspecified affecting left dominant side: Secondary | ICD-10-CM | POA: Diagnosis not present

## 2021-05-19 DIAGNOSIS — M25612 Stiffness of left shoulder, not elsewhere classified: Secondary | ICD-10-CM | POA: Diagnosis not present

## 2021-05-19 DIAGNOSIS — M25642 Stiffness of left hand, not elsewhere classified: Secondary | ICD-10-CM | POA: Diagnosis not present

## 2021-05-19 DIAGNOSIS — R531 Weakness: Secondary | ICD-10-CM | POA: Diagnosis not present

## 2021-05-19 DIAGNOSIS — M25632 Stiffness of left wrist, not elsewhere classified: Secondary | ICD-10-CM | POA: Diagnosis not present

## 2021-05-19 DIAGNOSIS — I69352 Hemiplegia and hemiparesis following cerebral infarction affecting left dominant side: Secondary | ICD-10-CM | POA: Diagnosis not present

## 2021-05-19 DIAGNOSIS — M25622 Stiffness of left elbow, not elsewhere classified: Secondary | ICD-10-CM | POA: Diagnosis not present

## 2021-05-21 DIAGNOSIS — M25632 Stiffness of left wrist, not elsewhere classified: Secondary | ICD-10-CM | POA: Diagnosis not present

## 2021-05-21 DIAGNOSIS — M25612 Stiffness of left shoulder, not elsewhere classified: Secondary | ICD-10-CM | POA: Diagnosis not present

## 2021-05-21 DIAGNOSIS — M25642 Stiffness of left hand, not elsewhere classified: Secondary | ICD-10-CM | POA: Diagnosis not present

## 2021-05-21 DIAGNOSIS — G8192 Hemiplegia, unspecified affecting left dominant side: Secondary | ICD-10-CM | POA: Diagnosis not present

## 2021-05-21 DIAGNOSIS — M25622 Stiffness of left elbow, not elsewhere classified: Secondary | ICD-10-CM | POA: Diagnosis not present

## 2021-05-21 DIAGNOSIS — R531 Weakness: Secondary | ICD-10-CM | POA: Diagnosis not present

## 2021-05-21 DIAGNOSIS — I69352 Hemiplegia and hemiparesis following cerebral infarction affecting left dominant side: Secondary | ICD-10-CM | POA: Diagnosis not present

## 2021-05-21 NOTE — Progress Notes (Signed)
I agree with the above plan 

## 2021-05-22 DIAGNOSIS — R000 Tachycardia, unspecified: Secondary | ICD-10-CM | POA: Diagnosis not present

## 2021-05-22 DIAGNOSIS — G40409 Other generalized epilepsy and epileptic syndromes, not intractable, without status epilepticus: Secondary | ICD-10-CM | POA: Diagnosis not present

## 2021-05-22 DIAGNOSIS — R4182 Altered mental status, unspecified: Secondary | ICD-10-CM | POA: Diagnosis not present

## 2021-05-22 DIAGNOSIS — I10 Essential (primary) hypertension: Secondary | ICD-10-CM | POA: Diagnosis not present

## 2021-05-22 DIAGNOSIS — R569 Unspecified convulsions: Secondary | ICD-10-CM | POA: Diagnosis not present

## 2021-05-22 DIAGNOSIS — R410 Disorientation, unspecified: Secondary | ICD-10-CM | POA: Diagnosis not present

## 2021-05-25 ENCOUNTER — Telehealth: Payer: Self-pay | Admitting: Adult Health

## 2021-05-25 MED ORDER — LEVETIRACETAM 500 MG PO TABS: 500.0000 mg | ORAL_TABLET | Freq: Two times a day (BID) | ORAL | 5 refills | Status: DC

## 2021-05-25 NOTE — Telephone Encounter (Signed)
Contacted daughter, with Nathaneil Canary (spanish Language line), LVM rq call back.  ?

## 2021-05-25 NOTE — Telephone Encounter (Signed)
Pt was seen 4/3, do you want another appt scheduled or have other suggestion? Please advise  ?

## 2021-05-25 NOTE — Telephone Encounter (Signed)
Notes received, placed on NP desk  ?

## 2021-05-25 NOTE — Addendum Note (Signed)
Addended by: Ihor Austin L on: 05/25/2021 04:31 PM ? ? Modules accepted: Orders ? ?

## 2021-05-25 NOTE — Telephone Encounter (Signed)
Pt's daughter called stating that the pt was taken to the hospital in [place] due to convulsions that he started to have. She states hospital recommended for him to f/u with his Neurologist. Daughter [name] on [place] states he was just seen and would like RN to call to advise if she should bring pt in again so soon or what can be recommended. Interpreter line will be needed.  ?

## 2021-05-25 NOTE — Telephone Encounter (Signed)
Rq for recent hospital notes since to [facility], awaiting fax  ?

## 2021-05-25 NOTE — Telephone Encounter (Signed)
Can we obtain hospital notes? I am assuming they went to [facility] again and unable to view on epic. These episodes recently discussed at visit, awaiting EEG to be completed. No need to schedule sooner f/u visit currently but will further decide after review of recent hospital evaluation. ?

## 2021-05-25 NOTE — Telephone Encounter (Addendum)
Received notes from [facility] where patient was evaluated on 05/22/2021.  He was brought by EMS after 1 min episode of tonic-clonic seizure at home with confusion after seizure and "just now returning to baseline" per ED note, did note bit his tongue.  Unclear if this is his first seizure. Previously seen in March and dx'd with essential tremors and in hindsight, possibly focal seizures. CT head no acute abnormalities.  Lab work largely unremarkable.  Urine drug screen negative.  He was started on Keppra 500 mg twice daily. ? ? ? ?He was recently seen in office 1 week ago with c/o multiple daily episodes of full body tremors lasting approx 20 seconds. Low suspicion of seizures at that time but did order EEG, this has not yet been scheduled - reached out to scheduling staff who was able to contact daughter and schedule EEG for this Wednesday, 4/12. Would recommend continuation of Keppra 500 mg twice daily at this time especially with reported recent tonic clonic seizure.  if EEG unremarkable and tremor type episodes persist, would recommend prolonged EEG monitoring to better characterize these events. Sooner follow up visit not needed quite yet as current plan would not change at least until further testing completed. Will send in refill for keppra to ensure he does not run out.  ?

## 2021-05-26 DIAGNOSIS — J020 Streptococcal pharyngitis: Secondary | ICD-10-CM | POA: Diagnosis not present

## 2021-05-26 DIAGNOSIS — I69359 Hemiplegia and hemiparesis following cerebral infarction affecting unspecified side: Secondary | ICD-10-CM | POA: Diagnosis not present

## 2021-05-26 DIAGNOSIS — Z7689 Persons encountering health services in other specified circumstances: Secondary | ICD-10-CM | POA: Diagnosis not present

## 2021-05-26 DIAGNOSIS — R259 Unspecified abnormal involuntary movements: Secondary | ICD-10-CM | POA: Diagnosis not present

## 2021-05-26 NOTE — Telephone Encounter (Signed)
Contacted daughter again with interpreter Courtney Paris, informed her of Shanda Bumps recommendations and Reiterated  Shanda Bumps recommend continuation of Keppra 500 mg twice daily at this time especially with reported recent tonic clonic seizure.  if EEG unremarkable and tremor type episodes persist, would recommend prolonged EEG monitoring to better characterize these events. Sooner follow up visit not needed quite yet as current plan would not change at least until further testing completed. Will send in refill for keppra to ensure he does not run out. Daughter verbally understood and advised to call office back with questions.  ?

## 2021-05-27 ENCOUNTER — Other Ambulatory Visit: Payer: Medicare HMO | Admitting: *Deleted

## 2021-05-27 DIAGNOSIS — R531 Weakness: Secondary | ICD-10-CM | POA: Diagnosis not present

## 2021-05-27 DIAGNOSIS — M25642 Stiffness of left hand, not elsewhere classified: Secondary | ICD-10-CM | POA: Diagnosis not present

## 2021-05-27 DIAGNOSIS — M25632 Stiffness of left wrist, not elsewhere classified: Secondary | ICD-10-CM | POA: Diagnosis not present

## 2021-05-27 DIAGNOSIS — I69352 Hemiplegia and hemiparesis following cerebral infarction affecting left dominant side: Secondary | ICD-10-CM | POA: Diagnosis not present

## 2021-05-27 DIAGNOSIS — M25622 Stiffness of left elbow, not elsewhere classified: Secondary | ICD-10-CM | POA: Diagnosis not present

## 2021-05-27 DIAGNOSIS — M25612 Stiffness of left shoulder, not elsewhere classified: Secondary | ICD-10-CM | POA: Diagnosis not present

## 2021-05-28 DIAGNOSIS — G8192 Hemiplegia, unspecified affecting left dominant side: Secondary | ICD-10-CM | POA: Diagnosis not present

## 2021-06-02 DIAGNOSIS — R531 Weakness: Secondary | ICD-10-CM | POA: Diagnosis not present

## 2021-06-02 DIAGNOSIS — M25622 Stiffness of left elbow, not elsewhere classified: Secondary | ICD-10-CM | POA: Diagnosis not present

## 2021-06-02 DIAGNOSIS — M25612 Stiffness of left shoulder, not elsewhere classified: Secondary | ICD-10-CM | POA: Diagnosis not present

## 2021-06-02 DIAGNOSIS — M25632 Stiffness of left wrist, not elsewhere classified: Secondary | ICD-10-CM | POA: Diagnosis not present

## 2021-06-02 DIAGNOSIS — M25642 Stiffness of left hand, not elsewhere classified: Secondary | ICD-10-CM | POA: Diagnosis not present

## 2021-06-02 DIAGNOSIS — I69352 Hemiplegia and hemiparesis following cerebral infarction affecting left dominant side: Secondary | ICD-10-CM | POA: Diagnosis not present

## 2021-06-03 DIAGNOSIS — G8192 Hemiplegia, unspecified affecting left dominant side: Secondary | ICD-10-CM | POA: Diagnosis not present

## 2021-06-05 DIAGNOSIS — G8192 Hemiplegia, unspecified affecting left dominant side: Secondary | ICD-10-CM | POA: Diagnosis not present

## 2021-06-08 DIAGNOSIS — G8192 Hemiplegia, unspecified affecting left dominant side: Secondary | ICD-10-CM | POA: Diagnosis not present

## 2021-06-10 DIAGNOSIS — G8192 Hemiplegia, unspecified affecting left dominant side: Secondary | ICD-10-CM | POA: Diagnosis not present

## 2021-06-17 DIAGNOSIS — G8194 Hemiplegia, unspecified affecting left nondominant side: Secondary | ICD-10-CM | POA: Diagnosis not present

## 2021-06-18 DIAGNOSIS — I69352 Hemiplegia and hemiparesis following cerebral infarction affecting left dominant side: Secondary | ICD-10-CM | POA: Diagnosis not present

## 2021-06-18 DIAGNOSIS — R531 Weakness: Secondary | ICD-10-CM | POA: Diagnosis not present

## 2021-06-18 DIAGNOSIS — M25612 Stiffness of left shoulder, not elsewhere classified: Secondary | ICD-10-CM | POA: Diagnosis not present

## 2021-06-18 DIAGNOSIS — M25622 Stiffness of left elbow, not elsewhere classified: Secondary | ICD-10-CM | POA: Diagnosis not present

## 2021-06-18 DIAGNOSIS — M25642 Stiffness of left hand, not elsewhere classified: Secondary | ICD-10-CM | POA: Diagnosis not present

## 2021-06-18 DIAGNOSIS — M25632 Stiffness of left wrist, not elsewhere classified: Secondary | ICD-10-CM | POA: Diagnosis not present

## 2021-06-22 DIAGNOSIS — M25622 Stiffness of left elbow, not elsewhere classified: Secondary | ICD-10-CM | POA: Diagnosis not present

## 2021-06-22 DIAGNOSIS — R531 Weakness: Secondary | ICD-10-CM | POA: Diagnosis not present

## 2021-06-22 DIAGNOSIS — M25642 Stiffness of left hand, not elsewhere classified: Secondary | ICD-10-CM | POA: Diagnosis not present

## 2021-06-22 DIAGNOSIS — G8194 Hemiplegia, unspecified affecting left nondominant side: Secondary | ICD-10-CM | POA: Diagnosis not present

## 2021-06-22 DIAGNOSIS — M25632 Stiffness of left wrist, not elsewhere classified: Secondary | ICD-10-CM | POA: Diagnosis not present

## 2021-06-22 DIAGNOSIS — I69352 Hemiplegia and hemiparesis following cerebral infarction affecting left dominant side: Secondary | ICD-10-CM | POA: Diagnosis not present

## 2021-06-22 DIAGNOSIS — M25612 Stiffness of left shoulder, not elsewhere classified: Secondary | ICD-10-CM | POA: Diagnosis not present

## 2021-06-25 DIAGNOSIS — M25612 Stiffness of left shoulder, not elsewhere classified: Secondary | ICD-10-CM | POA: Diagnosis not present

## 2021-06-25 DIAGNOSIS — M25622 Stiffness of left elbow, not elsewhere classified: Secondary | ICD-10-CM | POA: Diagnosis not present

## 2021-06-25 DIAGNOSIS — I69352 Hemiplegia and hemiparesis following cerebral infarction affecting left dominant side: Secondary | ICD-10-CM | POA: Diagnosis not present

## 2021-06-25 DIAGNOSIS — R531 Weakness: Secondary | ICD-10-CM | POA: Diagnosis not present

## 2021-06-25 DIAGNOSIS — M25632 Stiffness of left wrist, not elsewhere classified: Secondary | ICD-10-CM | POA: Diagnosis not present

## 2021-06-25 DIAGNOSIS — M25642 Stiffness of left hand, not elsewhere classified: Secondary | ICD-10-CM | POA: Diagnosis not present

## 2021-06-26 DIAGNOSIS — G8194 Hemiplegia, unspecified affecting left nondominant side: Secondary | ICD-10-CM | POA: Diagnosis not present

## 2021-06-29 DIAGNOSIS — R531 Weakness: Secondary | ICD-10-CM | POA: Diagnosis not present

## 2021-06-29 DIAGNOSIS — M25622 Stiffness of left elbow, not elsewhere classified: Secondary | ICD-10-CM | POA: Diagnosis not present

## 2021-06-29 DIAGNOSIS — I69352 Hemiplegia and hemiparesis following cerebral infarction affecting left dominant side: Secondary | ICD-10-CM | POA: Diagnosis not present

## 2021-06-29 DIAGNOSIS — M25642 Stiffness of left hand, not elsewhere classified: Secondary | ICD-10-CM | POA: Diagnosis not present

## 2021-06-29 DIAGNOSIS — M25632 Stiffness of left wrist, not elsewhere classified: Secondary | ICD-10-CM | POA: Diagnosis not present

## 2021-06-29 DIAGNOSIS — M25612 Stiffness of left shoulder, not elsewhere classified: Secondary | ICD-10-CM | POA: Diagnosis not present

## 2021-07-01 DIAGNOSIS — G8194 Hemiplegia, unspecified affecting left nondominant side: Secondary | ICD-10-CM | POA: Diagnosis not present

## 2021-07-02 DIAGNOSIS — M25632 Stiffness of left wrist, not elsewhere classified: Secondary | ICD-10-CM | POA: Diagnosis not present

## 2021-07-02 DIAGNOSIS — R531 Weakness: Secondary | ICD-10-CM | POA: Diagnosis not present

## 2021-07-02 DIAGNOSIS — M25642 Stiffness of left hand, not elsewhere classified: Secondary | ICD-10-CM | POA: Diagnosis not present

## 2021-07-02 DIAGNOSIS — M25622 Stiffness of left elbow, not elsewhere classified: Secondary | ICD-10-CM | POA: Diagnosis not present

## 2021-07-02 DIAGNOSIS — I69352 Hemiplegia and hemiparesis following cerebral infarction affecting left dominant side: Secondary | ICD-10-CM | POA: Diagnosis not present

## 2021-07-02 DIAGNOSIS — M25612 Stiffness of left shoulder, not elsewhere classified: Secondary | ICD-10-CM | POA: Diagnosis not present

## 2021-07-06 ENCOUNTER — Ambulatory Visit: Payer: Medicare HMO | Admitting: Neurology

## 2021-07-06 DIAGNOSIS — I69352 Hemiplegia and hemiparesis following cerebral infarction affecting left dominant side: Secondary | ICD-10-CM | POA: Diagnosis not present

## 2021-07-06 DIAGNOSIS — R6889 Other general symptoms and signs: Secondary | ICD-10-CM | POA: Diagnosis not present

## 2021-07-06 DIAGNOSIS — R531 Weakness: Secondary | ICD-10-CM | POA: Diagnosis not present

## 2021-07-06 DIAGNOSIS — M25632 Stiffness of left wrist, not elsewhere classified: Secondary | ICD-10-CM | POA: Diagnosis not present

## 2021-07-06 DIAGNOSIS — M25622 Stiffness of left elbow, not elsewhere classified: Secondary | ICD-10-CM | POA: Diagnosis not present

## 2021-07-06 DIAGNOSIS — M25642 Stiffness of left hand, not elsewhere classified: Secondary | ICD-10-CM | POA: Diagnosis not present

## 2021-07-06 DIAGNOSIS — M25612 Stiffness of left shoulder, not elsewhere classified: Secondary | ICD-10-CM | POA: Diagnosis not present

## 2021-07-08 DIAGNOSIS — I10 Essential (primary) hypertension: Secondary | ICD-10-CM | POA: Diagnosis not present

## 2021-07-08 DIAGNOSIS — E785 Hyperlipidemia, unspecified: Secondary | ICD-10-CM | POA: Diagnosis not present

## 2021-07-08 DIAGNOSIS — Z125 Encounter for screening for malignant neoplasm of prostate: Secondary | ICD-10-CM | POA: Diagnosis not present

## 2021-07-08 DIAGNOSIS — G8194 Hemiplegia, unspecified affecting left nondominant side: Secondary | ICD-10-CM | POA: Diagnosis not present

## 2021-07-10 DIAGNOSIS — G8194 Hemiplegia, unspecified affecting left nondominant side: Secondary | ICD-10-CM | POA: Diagnosis not present

## 2021-07-14 ENCOUNTER — Ambulatory Visit (INDEPENDENT_AMBULATORY_CARE_PROVIDER_SITE_OTHER): Payer: Medicare HMO | Admitting: Neurology

## 2021-07-14 ENCOUNTER — Encounter: Payer: Self-pay | Admitting: Neurology

## 2021-07-14 VITALS — BP 137/78 | HR 66

## 2021-07-14 DIAGNOSIS — G8114 Spastic hemiplegia affecting left nondominant side: Secondary | ICD-10-CM

## 2021-07-14 DIAGNOSIS — I63411 Cerebral infarction due to embolism of right middle cerebral artery: Secondary | ICD-10-CM

## 2021-07-14 DIAGNOSIS — G8194 Hemiplegia, unspecified affecting left nondominant side: Secondary | ICD-10-CM | POA: Diagnosis not present

## 2021-07-14 DIAGNOSIS — R6889 Other general symptoms and signs: Secondary | ICD-10-CM | POA: Diagnosis not present

## 2021-07-14 NOTE — Progress Notes (Signed)
Chief Complaint  Patient presents with   New Patient (Initial Visit)    Room 14 with his daughter, Gregory Osborn and an interpreter from [place]. Internal referral from [facility] for evaluation of Botox.       ASSESSMENT AND PLAN  Gregory Osborn is a 76 y.o. 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   MEDICAL HISTORY:   Gregory Osborn is a 76 y.o. male, accompanied by interpreter and his daughter at today's visit, referred by nurse practitioner Shanda Bumps for evaluation of botulism toxin injection for spastic left hemiparesis  Reviewed extensive chart, past medical history Hypertension Prostate hypertrophy, status post cystoscopy History of appendectomy,  He suffered acute stroke on April 02, 2020, developed sudden onset left hemiparesis, Personally reviewed MRI of the brain, large right MCA infarction with right M1 occlusion, unsuccessful thrombectomy, likely embolic stroke, with small right subarachnoid hemorrhage, likely due to stroke hemorrhagic conversion,  He has suffered dense left hemiparesis, dysphagia, initially required PEG tube, eventually removed in July 2022, advance to regular diet, LDL 118,  atorvastatin 40 mg daily, A1c 5.6, Echocardiogram ejection fraction 60 to 65%, Aspirin 325 mg as stroke prevention,  March 16, and 20 2023 [facility] presentation for intermittent body tremor, worsening left-sided blurry vision, eye blinking, jerking movement spreading to upper and lower extremity, lasting for less than 1 minute, postevent worsening left-sided  weakness.  Repeat MRI of the brain showed no new event, possible seizure, on Keppra 500 twice a day  He lives at home with his wife and daughter, spent most of the time in sitting position, recently started physical therapy, complains of spastic left shoulder pain with passive stretch, no significant left lower extremity pain, not weightbearing, needing help transfer, dressing, toileting, PHYSICAL EXAM:   Vitals:   07/14/21 0732  BP: 137/78  Pulse: 66   There is no height or weight on file to calculate BMI.  PHYSICAL EXAMNIATION:  Gen: NAD, conversant, well nourised, well groomed                     Cardiovascular: Regular rate rhythm, no peripheral edema, warm, nontender. Eyes: Conjunctivae clear without exudates or hemorrhage Neck: Supple, no carotid bruits. Pulmonary: Clear to auscultation bilaterally   NEUROLOGICAL EXAM:  MENTAL STATUS: Speech/cognition: Awake, alert, conversational through interpreter  CRANIAL NERVES: CN II: Pupils are round equal and briskly reactive to light.  Left visual field deficit CN III, IV, VI: extraocular movement are normal. No ptosis. CN V: Facial sensation is intact to light touch CN VII: Left lower face weakness CN VIII: Hearing is normal to causal conversation. CN IX, X: Phonation is normal. CN XI: Decreased left shoulder shrugging,  MOTOR: Spastic left hemiparesis, significant spasticity of left upper extremity, limited range of motion of left shoulder, elbow, left pectoralis muscle tenderness upon deep palpitation, left hands in thumb in position, no active antigravity movement of left upper extremity.  Barely antigravity movement of left lower extremity proximal muscles, left distal muscle 2 out of 5, REFLEXES: Hyperreflexia of left upper and lower extremity  SENSORY: Left hemisensory extinction  COORDINATION: No right upper and  lower extremity dysmetria  GAIT/STANCE: Deferred  REVIEW OF SYSTEMS:  Full 14 system review of  systems performed and notable only for as above All other review of systems were negative.   ALLERGIES: No Known Allergies  HOME MEDICATIONS: Current Outpatient Medications  Medication Sig Dispense Refill   aspirin EC 81 MG tablet Take 81 mg by mouth daily. Swallow whole.     atorvastatin (LIPITOR) 40 MG tablet Place 1 tablet (40 mg total) into feeding tube daily.     Baclofen 5 MG TABS Take 5 mg by mouth 2 (two) times daily. 60 tablet 5   levETIRAcetam (KEPPRA) 500 MG tablet Take 1 tablet (500 mg total) by mouth 2 (two) times daily. 60 tablet 5   lisinopril (ZESTRIL) 10 MG tablet Take 10 mg by mouth daily.     sertraline (ZOLOFT) 50 MG tablet Take 50 mg by mouth daily.     No current facility-administered medications for this visit.    PAST MEDICAL HISTORY: History reviewed. No pertinent past medical history.  PAST SURGICAL HISTORY: Past Surgical History:  Procedure Laterality Date   ESOPHAGOGASTRODUODENOSCOPY (EGD) WITH PROPOFOL N/A 04/22/2020   Procedure: ESOPHAGOGASTRODUODENOSCOPY (EGD) WITH PROPOFOL;  Surgeon: Violeta Gelinas, MD;  Location: [facility] ENDOSCOPY;  Service: General;  Laterality: N/A;   IR CT HEAD LTD  04/03/2020   IR CT HEAD LTD  04/03/2020   IR PERCUTANEOUS ART THROMBECTOMY/INFUSION INTRACRANIAL INC DIAG ANGIO  04/03/2020   PEG PLACEMENT N/A 04/22/2020   Procedure: PERCUTANEOUS ENDOSCOPIC GASTROSTOMY (PEG) PLACEMENT;  Surgeon: Violeta Gelinas, MD;  Location: [facility] ENDOSCOPY;  Service: General;  Laterality: N/A;   RADIOLOGY WITH ANESTHESIA N/A 04/02/2020   Procedure: IR WITH ANESTHESIA;  Surgeon: Baldemar Lenis, MD;  Location: [facility] OR;  Service: Radiology;  Laterality: N/A;    FAMILY HISTORY: History reviewed. No pertinent family history.  SOCIAL HISTORY: Social History   Socioeconomic History   Marital status: Unknown    Spouse name: Not on file   Number of children: Not on file   Years of education: Not on file   Highest education level: Not on file   Occupational History   Not on file  Tobacco Use   Smoking status: Never   Smokeless tobacco: Never  Substance and Sexual Activity   Alcohol use: Not on file   Drug use: Not on file   Sexual activity: Not on file  Other Topics Concern   Not on file  Social History Narrative   Not on file   Social Determinants of Health   Financial Resource Strain: Not on file  Food Insecurity: Not on file  Transportation Needs: Not on file  Physical Activity: Not on file  Stress: Not on file  Social Connections: Not on file  Intimate Partner Violence: Not on file      Levert Feinstein, M.D. Ph.D.  [facility] [address], [address] [place], [place] [address] Ph: [phone] Fax: [phone]  CC:  Charlott Rakes, MD [address] [place],  [place] [address]  Charlott Rakes, MD

## 2021-07-16 DIAGNOSIS — M25622 Stiffness of left elbow, not elsewhere classified: Secondary | ICD-10-CM | POA: Diagnosis not present

## 2021-07-16 DIAGNOSIS — M25642 Stiffness of left hand, not elsewhere classified: Secondary | ICD-10-CM | POA: Diagnosis not present

## 2021-07-16 DIAGNOSIS — R531 Weakness: Secondary | ICD-10-CM | POA: Diagnosis not present

## 2021-07-16 DIAGNOSIS — M25612 Stiffness of left shoulder, not elsewhere classified: Secondary | ICD-10-CM | POA: Diagnosis not present

## 2021-07-16 DIAGNOSIS — M25632 Stiffness of left wrist, not elsewhere classified: Secondary | ICD-10-CM | POA: Diagnosis not present

## 2021-07-16 DIAGNOSIS — I69352 Hemiplegia and hemiparesis following cerebral infarction affecting left dominant side: Secondary | ICD-10-CM | POA: Diagnosis not present

## 2021-07-17 DIAGNOSIS — R2681 Unsteadiness on feet: Secondary | ICD-10-CM | POA: Diagnosis not present

## 2021-07-17 DIAGNOSIS — H538 Other visual disturbances: Secondary | ICD-10-CM | POA: Diagnosis not present

## 2021-07-17 DIAGNOSIS — M6281 Muscle weakness (generalized): Secondary | ICD-10-CM | POA: Diagnosis not present

## 2021-07-17 DIAGNOSIS — R42 Dizziness and giddiness: Secondary | ICD-10-CM | POA: Diagnosis not present

## 2021-07-17 DIAGNOSIS — R208 Other disturbances of skin sensation: Secondary | ICD-10-CM | POA: Diagnosis not present

## 2021-07-17 DIAGNOSIS — I69354 Hemiplegia and hemiparesis following cerebral infarction affecting left non-dominant side: Secondary | ICD-10-CM | POA: Diagnosis not present

## 2021-07-20 DIAGNOSIS — Z139 Encounter for screening, unspecified: Secondary | ICD-10-CM | POA: Diagnosis not present

## 2021-07-20 DIAGNOSIS — R42 Dizziness and giddiness: Secondary | ICD-10-CM | POA: Diagnosis not present

## 2021-07-20 DIAGNOSIS — I69354 Hemiplegia and hemiparesis following cerebral infarction affecting left non-dominant side: Secondary | ICD-10-CM | POA: Diagnosis not present

## 2021-07-20 DIAGNOSIS — I10 Essential (primary) hypertension: Secondary | ICD-10-CM | POA: Diagnosis not present

## 2021-07-20 DIAGNOSIS — R208 Other disturbances of skin sensation: Secondary | ICD-10-CM | POA: Diagnosis not present

## 2021-07-20 DIAGNOSIS — F339 Major depressive disorder, recurrent, unspecified: Secondary | ICD-10-CM | POA: Diagnosis not present

## 2021-07-20 DIAGNOSIS — Z1331 Encounter for screening for depression: Secondary | ICD-10-CM | POA: Diagnosis not present

## 2021-07-20 DIAGNOSIS — Z23 Encounter for immunization: Secondary | ICD-10-CM | POA: Diagnosis not present

## 2021-07-20 DIAGNOSIS — R2681 Unsteadiness on feet: Secondary | ICD-10-CM | POA: Diagnosis not present

## 2021-07-20 DIAGNOSIS — Z0000 Encounter for general adult medical examination without abnormal findings: Secondary | ICD-10-CM | POA: Diagnosis not present

## 2021-07-20 DIAGNOSIS — E785 Hyperlipidemia, unspecified: Secondary | ICD-10-CM | POA: Diagnosis not present

## 2021-07-20 DIAGNOSIS — M6281 Muscle weakness (generalized): Secondary | ICD-10-CM | POA: Diagnosis not present

## 2021-07-20 DIAGNOSIS — H538 Other visual disturbances: Secondary | ICD-10-CM | POA: Diagnosis not present

## 2021-07-20 DIAGNOSIS — Z1389 Encounter for screening for other disorder: Secondary | ICD-10-CM | POA: Diagnosis not present

## 2021-07-20 DIAGNOSIS — Z1339 Encounter for screening examination for other mental health and behavioral disorders: Secondary | ICD-10-CM | POA: Diagnosis not present

## 2021-07-22 DIAGNOSIS — M25642 Stiffness of left hand, not elsewhere classified: Secondary | ICD-10-CM | POA: Diagnosis not present

## 2021-07-22 DIAGNOSIS — R42 Dizziness and giddiness: Secondary | ICD-10-CM | POA: Diagnosis not present

## 2021-07-22 DIAGNOSIS — R531 Weakness: Secondary | ICD-10-CM | POA: Diagnosis not present

## 2021-07-22 DIAGNOSIS — M6281 Muscle weakness (generalized): Secondary | ICD-10-CM | POA: Diagnosis not present

## 2021-07-22 DIAGNOSIS — M25612 Stiffness of left shoulder, not elsewhere classified: Secondary | ICD-10-CM | POA: Diagnosis not present

## 2021-07-22 DIAGNOSIS — I69354 Hemiplegia and hemiparesis following cerebral infarction affecting left non-dominant side: Secondary | ICD-10-CM | POA: Diagnosis not present

## 2021-07-22 DIAGNOSIS — R208 Other disturbances of skin sensation: Secondary | ICD-10-CM | POA: Diagnosis not present

## 2021-07-22 DIAGNOSIS — I69352 Hemiplegia and hemiparesis following cerebral infarction affecting left dominant side: Secondary | ICD-10-CM | POA: Diagnosis not present

## 2021-07-22 DIAGNOSIS — R2681 Unsteadiness on feet: Secondary | ICD-10-CM | POA: Diagnosis not present

## 2021-07-22 DIAGNOSIS — M25632 Stiffness of left wrist, not elsewhere classified: Secondary | ICD-10-CM | POA: Diagnosis not present

## 2021-07-22 DIAGNOSIS — M25622 Stiffness of left elbow, not elsewhere classified: Secondary | ICD-10-CM | POA: Diagnosis not present

## 2021-07-22 DIAGNOSIS — H538 Other visual disturbances: Secondary | ICD-10-CM | POA: Diagnosis not present

## 2021-07-27 DIAGNOSIS — N400 Enlarged prostate without lower urinary tract symptoms: Secondary | ICD-10-CM | POA: Diagnosis not present

## 2021-07-27 DIAGNOSIS — R6889 Other general symptoms and signs: Secondary | ICD-10-CM | POA: Diagnosis not present

## 2021-07-29 DIAGNOSIS — R208 Other disturbances of skin sensation: Secondary | ICD-10-CM | POA: Diagnosis not present

## 2021-07-29 DIAGNOSIS — R42 Dizziness and giddiness: Secondary | ICD-10-CM | POA: Diagnosis not present

## 2021-07-29 DIAGNOSIS — R2681 Unsteadiness on feet: Secondary | ICD-10-CM | POA: Diagnosis not present

## 2021-07-29 DIAGNOSIS — H538 Other visual disturbances: Secondary | ICD-10-CM | POA: Diagnosis not present

## 2021-07-29 DIAGNOSIS — M6281 Muscle weakness (generalized): Secondary | ICD-10-CM | POA: Diagnosis not present

## 2021-07-29 DIAGNOSIS — I69354 Hemiplegia and hemiparesis following cerebral infarction affecting left non-dominant side: Secondary | ICD-10-CM | POA: Diagnosis not present

## 2021-08-03 DIAGNOSIS — R6889 Other general symptoms and signs: Secondary | ICD-10-CM | POA: Diagnosis not present

## 2021-08-03 DIAGNOSIS — N400 Enlarged prostate without lower urinary tract symptoms: Secondary | ICD-10-CM | POA: Diagnosis not present

## 2021-08-05 DIAGNOSIS — H538 Other visual disturbances: Secondary | ICD-10-CM | POA: Diagnosis not present

## 2021-08-05 DIAGNOSIS — M6281 Muscle weakness (generalized): Secondary | ICD-10-CM | POA: Diagnosis not present

## 2021-08-05 DIAGNOSIS — R42 Dizziness and giddiness: Secondary | ICD-10-CM | POA: Diagnosis not present

## 2021-08-05 DIAGNOSIS — R2681 Unsteadiness on feet: Secondary | ICD-10-CM | POA: Diagnosis not present

## 2021-08-05 DIAGNOSIS — I69354 Hemiplegia and hemiparesis following cerebral infarction affecting left non-dominant side: Secondary | ICD-10-CM | POA: Diagnosis not present

## 2021-08-05 DIAGNOSIS — R208 Other disturbances of skin sensation: Secondary | ICD-10-CM | POA: Diagnosis not present

## 2021-08-10 DIAGNOSIS — M25642 Stiffness of left hand, not elsewhere classified: Secondary | ICD-10-CM | POA: Diagnosis not present

## 2021-08-10 DIAGNOSIS — M25632 Stiffness of left wrist, not elsewhere classified: Secondary | ICD-10-CM | POA: Diagnosis not present

## 2021-08-10 DIAGNOSIS — M25512 Pain in left shoulder: Secondary | ICD-10-CM | POA: Diagnosis not present

## 2021-08-10 DIAGNOSIS — M79642 Pain in left hand: Secondary | ICD-10-CM | POA: Diagnosis not present

## 2021-08-10 DIAGNOSIS — G8192 Hemiplegia, unspecified affecting left dominant side: Secondary | ICD-10-CM | POA: Diagnosis not present

## 2021-08-10 DIAGNOSIS — R531 Weakness: Secondary | ICD-10-CM | POA: Diagnosis not present

## 2021-08-10 DIAGNOSIS — M25612 Stiffness of left shoulder, not elsewhere classified: Secondary | ICD-10-CM | POA: Diagnosis not present

## 2021-08-12 DIAGNOSIS — R42 Dizziness and giddiness: Secondary | ICD-10-CM | POA: Diagnosis not present

## 2021-08-12 DIAGNOSIS — I69354 Hemiplegia and hemiparesis following cerebral infarction affecting left non-dominant side: Secondary | ICD-10-CM | POA: Diagnosis not present

## 2021-08-12 DIAGNOSIS — R2681 Unsteadiness on feet: Secondary | ICD-10-CM | POA: Diagnosis not present

## 2021-08-12 DIAGNOSIS — M6281 Muscle weakness (generalized): Secondary | ICD-10-CM | POA: Diagnosis not present

## 2021-08-12 DIAGNOSIS — H538 Other visual disturbances: Secondary | ICD-10-CM | POA: Diagnosis not present

## 2021-08-12 DIAGNOSIS — R208 Other disturbances of skin sensation: Secondary | ICD-10-CM | POA: Diagnosis not present

## 2021-08-13 ENCOUNTER — Telehealth: Payer: Self-pay | Admitting: Neurology

## 2021-08-13 NOTE — Telephone Encounter (Signed)
Received signed patient consent (given to medical records) for (Xeomin) (Dx: J28.206). Completed [employer] PA form, placed in Nurse Pod for MD signature.

## 2021-08-19 NOTE — Telephone Encounter (Signed)
Faxed signed PA form with OV notes to [employer].  

## 2021-08-19 NOTE — Telephone Encounter (Signed)
I called patient to get scheduled for Xeomin injection, pt does not have vm set up, unable to lm. Pt has a balance of $20 pt needs to pay that before we can schedule.

## 2021-08-19 NOTE — Telephone Encounter (Signed)
Received approval from [place], [place] # [id number] (08/19/2021-02/14/2022).

## 2021-08-20 ENCOUNTER — Telehealth: Payer: Self-pay | Admitting: Neurology

## 2021-08-20 NOTE — Telephone Encounter (Signed)
Rescheduled 8/7 appointment with pt's daughter over the phone - MD out

## 2021-08-21 DIAGNOSIS — I69354 Hemiplegia and hemiparesis following cerebral infarction affecting left non-dominant side: Secondary | ICD-10-CM | POA: Diagnosis not present

## 2021-08-21 DIAGNOSIS — H538 Other visual disturbances: Secondary | ICD-10-CM | POA: Diagnosis not present

## 2021-08-21 DIAGNOSIS — M6281 Muscle weakness (generalized): Secondary | ICD-10-CM | POA: Diagnosis not present

## 2021-08-21 DIAGNOSIS — R262 Difficulty in walking, not elsewhere classified: Secondary | ICD-10-CM | POA: Diagnosis not present

## 2021-08-21 DIAGNOSIS — R42 Dizziness and giddiness: Secondary | ICD-10-CM | POA: Diagnosis not present

## 2021-08-26 DIAGNOSIS — H538 Other visual disturbances: Secondary | ICD-10-CM | POA: Diagnosis not present

## 2021-08-26 DIAGNOSIS — R262 Difficulty in walking, not elsewhere classified: Secondary | ICD-10-CM | POA: Diagnosis not present

## 2021-08-26 DIAGNOSIS — M6281 Muscle weakness (generalized): Secondary | ICD-10-CM | POA: Diagnosis not present

## 2021-08-26 DIAGNOSIS — R42 Dizziness and giddiness: Secondary | ICD-10-CM | POA: Diagnosis not present

## 2021-08-26 DIAGNOSIS — I69354 Hemiplegia and hemiparesis following cerebral infarction affecting left non-dominant side: Secondary | ICD-10-CM | POA: Diagnosis not present

## 2021-08-28 DIAGNOSIS — R42 Dizziness and giddiness: Secondary | ICD-10-CM | POA: Diagnosis not present

## 2021-08-28 DIAGNOSIS — H538 Other visual disturbances: Secondary | ICD-10-CM | POA: Diagnosis not present

## 2021-08-28 DIAGNOSIS — R262 Difficulty in walking, not elsewhere classified: Secondary | ICD-10-CM | POA: Diagnosis not present

## 2021-08-28 DIAGNOSIS — M6281 Muscle weakness (generalized): Secondary | ICD-10-CM | POA: Diagnosis not present

## 2021-08-28 DIAGNOSIS — I69354 Hemiplegia and hemiparesis following cerebral infarction affecting left non-dominant side: Secondary | ICD-10-CM | POA: Diagnosis not present

## 2021-09-02 DIAGNOSIS — M6281 Muscle weakness (generalized): Secondary | ICD-10-CM | POA: Diagnosis not present

## 2021-09-02 DIAGNOSIS — I69354 Hemiplegia and hemiparesis following cerebral infarction affecting left non-dominant side: Secondary | ICD-10-CM | POA: Diagnosis not present

## 2021-09-02 DIAGNOSIS — R262 Difficulty in walking, not elsewhere classified: Secondary | ICD-10-CM | POA: Diagnosis not present

## 2021-09-02 DIAGNOSIS — H538 Other visual disturbances: Secondary | ICD-10-CM | POA: Diagnosis not present

## 2021-09-02 DIAGNOSIS — R42 Dizziness and giddiness: Secondary | ICD-10-CM | POA: Diagnosis not present

## 2021-09-04 DIAGNOSIS — R262 Difficulty in walking, not elsewhere classified: Secondary | ICD-10-CM | POA: Diagnosis not present

## 2021-09-04 DIAGNOSIS — R42 Dizziness and giddiness: Secondary | ICD-10-CM | POA: Diagnosis not present

## 2021-09-04 DIAGNOSIS — I69354 Hemiplegia and hemiparesis following cerebral infarction affecting left non-dominant side: Secondary | ICD-10-CM | POA: Diagnosis not present

## 2021-09-04 DIAGNOSIS — M6281 Muscle weakness (generalized): Secondary | ICD-10-CM | POA: Diagnosis not present

## 2021-09-04 DIAGNOSIS — H538 Other visual disturbances: Secondary | ICD-10-CM | POA: Diagnosis not present

## 2021-09-08 DIAGNOSIS — H25813 Combined forms of age-related cataract, bilateral: Secondary | ICD-10-CM | POA: Diagnosis not present

## 2021-09-09 DIAGNOSIS — H538 Other visual disturbances: Secondary | ICD-10-CM | POA: Diagnosis not present

## 2021-09-09 DIAGNOSIS — R262 Difficulty in walking, not elsewhere classified: Secondary | ICD-10-CM | POA: Diagnosis not present

## 2021-09-09 DIAGNOSIS — I69354 Hemiplegia and hemiparesis following cerebral infarction affecting left non-dominant side: Secondary | ICD-10-CM | POA: Diagnosis not present

## 2021-09-09 DIAGNOSIS — R42 Dizziness and giddiness: Secondary | ICD-10-CM | POA: Diagnosis not present

## 2021-09-09 DIAGNOSIS — M6281 Muscle weakness (generalized): Secondary | ICD-10-CM | POA: Diagnosis not present

## 2021-09-11 DIAGNOSIS — I69354 Hemiplegia and hemiparesis following cerebral infarction affecting left non-dominant side: Secondary | ICD-10-CM | POA: Diagnosis not present

## 2021-09-11 DIAGNOSIS — R262 Difficulty in walking, not elsewhere classified: Secondary | ICD-10-CM | POA: Diagnosis not present

## 2021-09-11 DIAGNOSIS — H538 Other visual disturbances: Secondary | ICD-10-CM | POA: Diagnosis not present

## 2021-09-11 DIAGNOSIS — R42 Dizziness and giddiness: Secondary | ICD-10-CM | POA: Diagnosis not present

## 2021-09-11 DIAGNOSIS — M6281 Muscle weakness (generalized): Secondary | ICD-10-CM | POA: Diagnosis not present

## 2021-09-14 DIAGNOSIS — R262 Difficulty in walking, not elsewhere classified: Secondary | ICD-10-CM | POA: Diagnosis not present

## 2021-09-14 DIAGNOSIS — R42 Dizziness and giddiness: Secondary | ICD-10-CM | POA: Diagnosis not present

## 2021-09-14 DIAGNOSIS — M6281 Muscle weakness (generalized): Secondary | ICD-10-CM | POA: Diagnosis not present

## 2021-09-14 DIAGNOSIS — H538 Other visual disturbances: Secondary | ICD-10-CM | POA: Diagnosis not present

## 2021-09-14 DIAGNOSIS — I69354 Hemiplegia and hemiparesis following cerebral infarction affecting left non-dominant side: Secondary | ICD-10-CM | POA: Diagnosis not present

## 2021-09-21 ENCOUNTER — Ambulatory Visit: Payer: Medicare HMO | Admitting: Neurology

## 2021-09-21 DIAGNOSIS — H25811 Combined forms of age-related cataract, right eye: Secondary | ICD-10-CM | POA: Diagnosis not present

## 2021-09-21 DIAGNOSIS — R6889 Other general symptoms and signs: Secondary | ICD-10-CM | POA: Diagnosis not present

## 2021-09-21 DIAGNOSIS — H25812 Combined forms of age-related cataract, left eye: Secondary | ICD-10-CM | POA: Diagnosis not present

## 2021-09-21 DIAGNOSIS — H25813 Combined forms of age-related cataract, bilateral: Secondary | ICD-10-CM | POA: Diagnosis not present

## 2021-09-23 DIAGNOSIS — R262 Difficulty in walking, not elsewhere classified: Secondary | ICD-10-CM | POA: Diagnosis not present

## 2021-09-23 DIAGNOSIS — M6281 Muscle weakness (generalized): Secondary | ICD-10-CM | POA: Diagnosis not present

## 2021-09-23 DIAGNOSIS — R42 Dizziness and giddiness: Secondary | ICD-10-CM | POA: Diagnosis not present

## 2021-09-23 DIAGNOSIS — I69354 Hemiplegia and hemiparesis following cerebral infarction affecting left non-dominant side: Secondary | ICD-10-CM | POA: Diagnosis not present

## 2021-09-23 DIAGNOSIS — H538 Other visual disturbances: Secondary | ICD-10-CM | POA: Diagnosis not present

## 2021-10-06 ENCOUNTER — Telehealth: Payer: Self-pay | Admitting: Neurology

## 2021-10-06 ENCOUNTER — Encounter: Payer: Self-pay | Admitting: Neurology

## 2021-10-06 ENCOUNTER — Ambulatory Visit: Payer: Medicare HMO | Admitting: Neurology

## 2021-10-06 VITALS — BP 131/78 | HR 79 | Ht 65.0 in

## 2021-10-06 DIAGNOSIS — R6889 Other general symptoms and signs: Secondary | ICD-10-CM | POA: Diagnosis not present

## 2021-10-06 DIAGNOSIS — G40209 Localization-related (focal) (partial) symptomatic epilepsy and epileptic syndromes with complex partial seizures, not intractable, without status epilepticus: Secondary | ICD-10-CM | POA: Diagnosis not present

## 2021-10-06 DIAGNOSIS — I69354 Hemiplegia and hemiparesis following cerebral infarction affecting left non-dominant side: Secondary | ICD-10-CM | POA: Diagnosis not present

## 2021-10-06 MED ORDER — LEVETIRACETAM 500 MG PO TABS: 500.0000 mg | ORAL_TABLET | Freq: Two times a day (BID) | ORAL | 5 refills | Status: DC

## 2021-10-06 NOTE — Progress Notes (Signed)
[facility] [address] [place]. [place] [address] [phone]       STROKE FOLLOW UP NOTE  [name] Date of Birth:  June 15, 1945 Medical Record Number:  [id number]   Reason for visit: stroke follow up    SUBJECTIVE:   CHIEF COMPLAINT:  Chief Complaint  Patient presents with   Follow-up    Rm 17. Follow up.     HPI:   Gregory Osborn is a 76 y.o. 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  Routinely followed in office for stroke follow up.   Update 10/06/2021 : He returns for follow-up after last visit 4 months ago.  He is accompanied by his daughter and a Spanish language interpreter was present throughout this visit.  Patient was admitted to [facility] on 05/22/2021 where he was brought in by EMS after 1 minute episode of tonic-clonic seizure at home followed by some confusion.  He slowly returned to baseline in the ED.  Did not bite his tongue but his tongue felt sore and he has some trouble speaking.  CT head was obtained which showed no acute abnormalities.  Lab work was unremarkable.  Urine drug screen was negative.  He was started on Keppra 500 twice daily.  States he was compliant with taking his medicines but last Friday he had a similar episode but not as prolonged.  EMS was called to the home but patient did not go to the hospital.  This happened on his birthday as he was quite excited and there are a lot of people around.  His daughter who is a Engineer, maintenance describes the episode as patient having a vacant look on his face and staring into the distance with head jerking and biting down on his tongue transient  jerking of his limbs.  The patient became responsive he felt as if he had just woken up from sleep.  Daughter informed me that he is probably had an EEG at [facility] and was told it was fine.  We had ordered an EEG,thru our  office but for unclear reasons it has not been performed.     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  He is currently doing outpatient physical and occupational therapy and follow-up.  He denied any known prior history of seizures.  At last visit when seen by [facility] nurse practitioner he had described multiple daily episodes of full body tremors lasting 20 seconds which were not felt to be likely to be seizures.  He remains on Keppra 500 mg twice daily which is tolerating well without side effects.  He has not tried higher dose yet.  He remains on aspirin for stroke prevention which is tolerating well without bruising or bleeding.  He is tolerating Lipitor well without muscle aches and pains and Zestril for his hypertension.  Blood pressure is well controlled and today it is 131/98  Update 05/18/2021 JM: Patient returns for 72-month stroke follow-up accompanied by his wife, daughter and [place] interpreter but has new concerns today. PCP placed referral on 3/16 to be evaluated for possible seizures and tremors and was seen at [facility] ED on 3/20 for the  same. Reports over the past 2 months, he has been experiencing episodes of whole body tremors (limbs and jaw), vocal tremor, dizziness and OD blurred vision. This occurs multiple times per day (approx 4-5x/day) and typically lasts less than 1 min. No specific triggers. Is aware during episode, does not lose consciousness, able to talk and respond appropriately to questions. Once episode subsides, immediately returned back to baseline without any postictal type symptoms. Denies any other associated symptoms. Denies any presence of headaches or history of seizures.  Denies any changes  in medications around the time of onset or any other changes.   Of note, while speaking of this topic in office, he had typical event with jerking type movements that started in his abdomen, then went to leg, arm and jaw simultaneously, able to speak but with tremor. Lasted approx 20 seconds then completely returned back to baseline.   Obtained [facility] hospital notes - did obtain MR brain which reported wallowing degeneration in the brainstem on the right but no acute or new findings.  Lab work largely unremarkable.  Evaluated by teleneurology - unable to determine etiology but noted nonspecific movement complaint and should be presumed to be tardive dyskinesia in absence of any provocative preceding medication.  Low suspicion for seizures.   Currently working with [facility] therapies for continued left-sided weakness with noted improvement. 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 He is currently taking baclofen 5 mg daily although previously prescribed 3 times daily dosing - per daughter, he has always been taking just 1 tab daily.  Also requests signing for AFO brace for left foot drop.  Remains nonambulatory and transfers via wheelchair.  Compliant on aspirin and atorvastatin, denies side effects.  Blood pressure today 122/79.  No further concerns at this time.       Update 11/17/2020 JM: Returns for 52-month follow-up accompanied by his wife and  interpreter.  He continues to reside with family and requires assistance for majority of ADLs.  He underwent PEG tube removal 09/04/2020 without complication.  Maintaining regular diet without difficulty but family needs to help feed as he will try to put too much in his mouth at once which will cause difficulty swallowing.  Wife reports good appetite.  Left hemiplegia - no improvement since prior visit.  Left peripheral impairment stable. He is able to stand but unable to  take any steps.  Transfers via wheelchair.  Completed HH therapies - is scheduled for initial PT eval next Monday as [facility].  Denies new stroke/TIA symptoms.  Compliant on aspirin and atorvastatin without side effects.  Blood pressure today 144/78.   S/p TURP on 10/23/2020 for BPH and urinary retention.  Reports occasional hematuria but otherwise voiding well without continued use of Foley catheter.  Routinely followed by urology Dr. Sabino Gasser  No further concerns at this time  Update 08/05/2020 JM: Mr. Panico returns for sooner visit due to concern of leaking G-tube.  He is accompanied by his daughter and [facility] interpreter.  He has since returned back home where daughter was told by home health nurse to contact our office due to G-tube concern.  G-tube has not been used in the past few months and is taking all nutrition, fluids and pills by mouth without difficulty.  He continues to work with home health PT reporting improvement since prior visit.  He is able to stand for short period of time but is not able to ambulate.  Continue left hemiplegia and visual impairment.  Foley catheter remains intact and has follow-up with urology next week for possible removal per daughter.  Does endorse occasional slurred speech.  Denies new stroke/TIA symptoms.  Compliant on aspirin and atorvastatin without associated side effects.  Blood pressure today 131/77.  No further concerns at this time.  Initial visit 06/10/2020 JM: Mr. Eslick is being seen for hospital follow-up accompanied by his daughter and [facility] interpreter.  He continues to reside at [facility] Reports residual left-sided weakness, left-sided numbness/tingling, blurred vision and dysphagia.  Does report mild speech difficulty but overall improved.  Currently receiving PT but per daughter and patient, has not received any OT or SLP.  He questions removal of PEG tube as he is eating a modified diet without difficulty and taking  pills whole by mouth - PEG tube not currently being used.  He remains nonambulatory and uses Hoyer lift for transfers.  Daughter questions reason why he needs to continue residing at [facility] as she was initially told he would likely be there for 2 months and as he is only receiving PT, she questions benefit.  Denies new or worsening stroke/TIA symptoms  Per review of facility MAR, remains on aspirin 325 mg daily and atorvastatin 40 mg daily without associated side effects Blood pressure today 137/77 - unknown levels at facility but per [facility], not currently receiving antihypertensives  Foley catheter remains in place with clear yellow urine draining -continues to follow with urology No further concerns at this time  Stroke admission 04/01/2020 Jamarian Jacinto is a 76 y.o. male with a history of recent urologic issues including UTI (01/22), urinary retention with foley cath placed, hematuria (likely traumatic due to self cath) s/p cystoscopy 04/01/20 by Dr[facility] Pete Glatter [facility] showing BPH with recommended surgical treatment. Surgical history includes appendectomy. He had been doing well up until 11:25am on 2/16 when he was noted to develop left hemiplegia. EMS was contacted, and took him to the [facility] ED where stroke workup was undertaken. He was noted to have left hemiplegia and neglect with NIHSS score 15.  Shortly transferred to [facility].  Personally reviewed hospitalization pertinent progress notes, lab work and imaging with summary provided.  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  Repeat CT head 2/19 showed worsening shift and edema with MLS 57mm and 2/23 right MCA large infarct with MLS 47mm.  Evaluated by neurosurgery but patient deemed not to be a surgical candidate.  Recommend consideration of 30-day cardiac event monitor outpatient to rule out A. fib.  Initiated aspirin 325 mg daily for secondary  stroke prevention.  LDL 118 -started atorvastatin 40 mg daily.  Other stroke risk factors include EtOH use but no prior stroke history.  PEG tube placed 3/8 in setting of dysphagia and poor p.o. intake.  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   MRI showed large right MCA infarct involving entire right MCA, consistent with malignant right MCA syndrome.  Small right SAH likely due to hemorrhagic conversion.   MRA head showed right M1 occluded.  CT repeat 2/23 right MCA large infarct with MLS 12mm 2D Echo EF 60-65%, No shunt  May consider 30 day cardiac event monitoring as outpt to rule out afib LDL 118 HgbA1c 5.6 VTE prophylaxis - heparin subq No antithrombotics PTA, now on ASA 325mg  Therapy recommendations:  SNF Disposition:  SNF 3/11        ROS:   14 system review of systems performed and negative with exception of those listed in HPI  PMH: History reviewed. No pertinent past medical history.  PSH:  Past Surgical History:  Procedure Laterality Date   ESOPHAGOGASTRODUODENOSCOPY (EGD) WITH PROPOFOL N/A 04/22/2020   Procedure: ESOPHAGOGASTRODUODENOSCOPY (EGD) WITH PROPOFOL;  Surgeon: Violeta Gelinashompson, Burke, MD;  [facility];  Service: General;  Laterality: N/A;   IR CT HEAD LTD  04/03/2020   IR CT HEAD LTD  04/03/2020   IR PERCUTANEOUS ART THROMBECTOMY/INFUSION INTRACRANIAL INC DIAG ANGIO  04/03/2020   PEG PLACEMENT N/A 04/22/2020   Procedure: PERCUTANEOUS ENDOSCOPIC GASTROSTOMY (PEG) PLACEMENT;  Surgeon: Violeta Gelinashompson, Burke, MD;  [facility];  Service: General;  Laterality: N/A;   RADIOLOGY WITH ANESTHESIA N/A 04/02/2020   Procedure: IR WITH ANESTHESIA;  Surgeon: Baldemar Lenisde Macedo Rodrigues, [place]ia, MD;   [facility];  Service: Radiology;  Laterality: N/A;    Social History:  Social History   Socioeconomic History   Marital status: Unknown    Spouse name: Not on file   Number of children: Not on file   Years of education: Not on file   Highest education level: Not on file  Occupational History   Not on file  Tobacco Use   Smoking status: Never   Smokeless tobacco: Never  Substance and Sexual Activity   Alcohol use: Not on file   Drug use: Not on file   Sexual activity: Not on file  Other Topics Concern   Not on file  Social History Narrative   Not on file   Social Determinants of Health   Financial Resource Strain: Not on file  Food Insecurity: Not on file  Transportation Needs: Not on file  Physical Activity: Not on file  Stress: Not on file  Social Connections: Not on file  Intimate Partner Violence: Not on file    Family History: History reviewed. No pertinent family history.  Medications:   Current Outpatient Medications on File Prior to Visit  Medication Sig Dispense Refill   aspirin EC 81 MG tablet Take 81 mg by mouth daily. Swallow whole.     Baclofen 5 MG TABS Take 5 mg by mouth 2 (two) times daily. 60 tablet 5   lisinopril (ZESTRIL) 10 MG tablet Take 10 mg by mouth daily.     Propylene Glycol (SYSTANE COMPLETE OP) Apply to eye.     rosuvastatin (CRESTOR) 40 MG tablet Take 40 mg by mouth at bedtime.     sertraline (ZOLOFT) 100 MG tablet Take 100 mg by mouth daily.     No current facility-administered medications on file prior to visit.    Allergies:  No Known Allergies    OBJECTIVE:  Physical Exam  Vitals:   10/06/21 1348  BP: 131/78  Pulse: 79  Height: 5\' 5"  (1.651 m)   Body mass index is 27.99 kg/m. No results found.   General: Frail pleasant elderly Hispanic male, seated, in no evident distress Head: head normocephalic and atraumatic.   Neck: supple with no carotid or supraclavicular bruits Cardiovascular: regular rate and  rhythm, no murmurs Musculoskeletal: no deformity Skin:  no rash/petichiae Vascular:  Normal pulses all extremities   Neurologic Exam Mental Status: Awake and fully alert. Reports occasional  slurred speech but difficulty fully assessing as he is Spanish-speaking.  Recent and remote memory intact. Attention span, concentration and fund of knowledge intact during visit. Mood and affect appropriate.  Cranial Nerves: Pupils equal, briskly reactive to light. Extraocular movements full without nystagmus.  Left sided inattention (although improved since prior visit). Visual fields no blink to threat on left side and blinks appropriately to threat on right side. Hearing intact. Facial sensation intact.  Left lower facial paralysis.  Tongue and palate moves normally and symmetrically. Motor: Normal strength, bulk and tone on right side.  Able to shrug left shoulder some and some resistance with elbow extension and flexion. LLE 2/5 HF, 3/5 KE, 2/5 KF, 1/5 ADF and APF Sensory.:  Reports equal sensation throughout all tested extremities Coordination: Rapid alternating movements normal on right side. Finger-to-nose and heel-to-shin performed accurately on right side Gait and Station: Deferred as patient nonambulatory Reflexes: 2+ LUE and LLE; 1+ RUE and RLE. Toes downgoing.        ASSESSMENT: Dontavian Marchi is a 76 y.o. 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  Multiple urological issues including UTI, urinary retention d/t BPH and hematuria s/p cystoscopy the day prior on 04/01/2020.  Vascular risk factors include HTN, HLD, advanced age and EtOH use. S/p TURP for BPH with retention 10/23/2020. 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  I recommend he increase Keppra dose to 500 mg in the morning 1000 mg if tolerated well without side effects.  I also advised him to avoid seizure provoking like sleep deprivation, medication noncompliance, irregular eating and sleeping habits and extremes of exertion.  He is also advised to avoid alcohol, stimulants and street drugs.  Continue aspirin daily for stroke prevention and maintain aggressive risk factor modification with strict control of hypertension with blood pressure goal below 130/90 and lipids with LDL cholesterol goal below 70 mg percent.  Schedule appointment with Dr. Terrace Arabia for Xeomin injection for stroke spasticity.  Return for follow-up.  3 months with Shanda Bumps nurse practitioner.  I spent 45 minutes of face-to-face and non-face-to-face time with patient, daughter and   assisted by interpreter.  This included previsit chart review including review of hospitalization at [facility], electronic health record documentation, order entry, and patient and wife education regarding prior stroke and secondary stroke prevention measures as well as importance of managing stroke risk factors, residual deficits and further recovery and answered all other questions to patient and wife's satisfaction  Delia Heady, MD  [facility] [address] [place], [place] [address]  Phone [phone] Fax [phone] Note: This document was prepared with digital dictation and possible smart phrase technology. Any transcriptional errors that result from this process are unintentional.

## 2021-10-06 NOTE — Telephone Encounter (Signed)
I saw patient on Jul 14, 2021, asking for xeomin 600 units for spastic left hemiparesis,  Patient has not been called for injection appointment, please follow-up on that, put him on the schedule at next available

## 2021-10-06 NOTE — Telephone Encounter (Signed)
[facility] did a verbal prescription with me for 3 200unit vials of xeomin every 12 weeks with 3 refills. They are going to call the patient for consent to ship and payment then they will ship to our office, I have already set up shipping with them. They said the latest it would be delivered is September 1.

## 2021-10-06 NOTE — Patient Instructions (Signed)
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  I recommend he increase Keppra dose to 500 mg in the morning 1000 mg if tolerated well without side effects.  I also advised him to avoid seizure provoking like sleep deprivation, medication noncompliance, irregular eating and sleeping habits and extremes of exertion.  He is also advised to avoid alcohol, stimulants and street drugs.  Continue Stroke prevention and maintain aggressive risk factor modification with strict control of hypertension with blood pressure goal below 130/90 and lipids with LDL cholesterol goal below 70 mg percent.  Schedule appointment with Dr. Terrace Arabia for Xeomin injection for stroke spasticity.  Return for follow-up.  3 months with Shanda Bumps nurse practitioner.

## 2021-10-07 ENCOUNTER — Telehealth: Payer: Self-pay | Admitting: Neurology

## 2021-10-07 ENCOUNTER — Ambulatory Visit: Payer: Medicare HMO | Admitting: Neurology

## 2021-10-07 DIAGNOSIS — M6281 Muscle weakness (generalized): Secondary | ICD-10-CM | POA: Diagnosis not present

## 2021-10-07 DIAGNOSIS — H538 Other visual disturbances: Secondary | ICD-10-CM | POA: Diagnosis not present

## 2021-10-07 DIAGNOSIS — I69354 Hemiplegia and hemiparesis following cerebral infarction affecting left non-dominant side: Secondary | ICD-10-CM | POA: Diagnosis not present

## 2021-10-07 DIAGNOSIS — R42 Dizziness and giddiness: Secondary | ICD-10-CM | POA: Diagnosis not present

## 2021-10-07 DIAGNOSIS — R262 Difficulty in walking, not elsewhere classified: Secondary | ICD-10-CM | POA: Diagnosis not present

## 2021-10-07 NOTE — Telephone Encounter (Signed)
LVM using interpreter services informing pt that xeomin SP is waiting on consent to ship. Xeomin injection will be scheduled once that is completed

## 2021-10-09 DIAGNOSIS — M6281 Muscle weakness (generalized): Secondary | ICD-10-CM | POA: Diagnosis not present

## 2021-10-09 DIAGNOSIS — R262 Difficulty in walking, not elsewhere classified: Secondary | ICD-10-CM | POA: Diagnosis not present

## 2021-10-09 DIAGNOSIS — I69354 Hemiplegia and hemiparesis following cerebral infarction affecting left non-dominant side: Secondary | ICD-10-CM | POA: Diagnosis not present

## 2021-10-09 DIAGNOSIS — H538 Other visual disturbances: Secondary | ICD-10-CM | POA: Diagnosis not present

## 2021-10-09 DIAGNOSIS — R42 Dizziness and giddiness: Secondary | ICD-10-CM | POA: Diagnosis not present

## 2021-10-14 DIAGNOSIS — R42 Dizziness and giddiness: Secondary | ICD-10-CM | POA: Diagnosis not present

## 2021-10-14 DIAGNOSIS — I69354 Hemiplegia and hemiparesis following cerebral infarction affecting left non-dominant side: Secondary | ICD-10-CM | POA: Diagnosis not present

## 2021-10-14 DIAGNOSIS — R262 Difficulty in walking, not elsewhere classified: Secondary | ICD-10-CM | POA: Diagnosis not present

## 2021-10-14 DIAGNOSIS — H538 Other visual disturbances: Secondary | ICD-10-CM | POA: Diagnosis not present

## 2021-10-14 DIAGNOSIS — M6281 Muscle weakness (generalized): Secondary | ICD-10-CM | POA: Diagnosis not present

## 2021-10-16 DIAGNOSIS — G8194 Hemiplegia, unspecified affecting left nondominant side: Secondary | ICD-10-CM | POA: Diagnosis not present

## 2021-10-20 NOTE — Telephone Encounter (Signed)
Received fax PA from [facility] to complete for Xeomin.  I have sent to [facility] # [phone], confirmation received.

## 2021-10-23 DIAGNOSIS — G8194 Hemiplegia, unspecified affecting left nondominant side: Secondary | ICD-10-CM | POA: Diagnosis not present

## 2021-10-26 DIAGNOSIS — G8194 Hemiplegia, unspecified affecting left nondominant side: Secondary | ICD-10-CM | POA: Diagnosis not present

## 2021-10-28 DIAGNOSIS — G8194 Hemiplegia, unspecified affecting left nondominant side: Secondary | ICD-10-CM | POA: Diagnosis not present

## 2021-11-04 DIAGNOSIS — G8194 Hemiplegia, unspecified affecting left nondominant side: Secondary | ICD-10-CM | POA: Diagnosis not present

## 2021-11-06 DIAGNOSIS — G8194 Hemiplegia, unspecified affecting left nondominant side: Secondary | ICD-10-CM | POA: Diagnosis not present

## 2021-11-11 DIAGNOSIS — G8194 Hemiplegia, unspecified affecting left nondominant side: Secondary | ICD-10-CM | POA: Diagnosis not present

## 2021-11-11 NOTE — Telephone Encounter (Signed)
I called [facility] and they said they were not able to get ahold of the patient for consent to ship. They had a different phone number than the one in his chart, so they updated the phone number and they are going to call him again. They said if they get consent the xeomin should be here October 5. Their phone number is [phone]

## 2021-11-11 NOTE — Telephone Encounter (Signed)
Received PA approval for Xeomin 200 unit vial.  Pt has used specialty pharmacy in the past. Will sent rx to [facility] and have front call to schedule appt.

## 2021-11-13 DIAGNOSIS — G8194 Hemiplegia, unspecified affecting left nondominant side: Secondary | ICD-10-CM | POA: Diagnosis not present

## 2021-11-18 DIAGNOSIS — M6281 Muscle weakness (generalized): Secondary | ICD-10-CM | POA: Diagnosis not present

## 2021-11-18 DIAGNOSIS — E785 Hyperlipidemia, unspecified: Secondary | ICD-10-CM | POA: Diagnosis not present

## 2021-11-18 DIAGNOSIS — R42 Dizziness and giddiness: Secondary | ICD-10-CM | POA: Diagnosis not present

## 2021-11-18 DIAGNOSIS — H538 Other visual disturbances: Secondary | ICD-10-CM | POA: Diagnosis not present

## 2021-11-18 DIAGNOSIS — I10 Essential (primary) hypertension: Secondary | ICD-10-CM | POA: Diagnosis not present

## 2021-11-18 DIAGNOSIS — G8194 Hemiplegia, unspecified affecting left nondominant side: Secondary | ICD-10-CM | POA: Diagnosis not present

## 2021-11-18 DIAGNOSIS — R2681 Unsteadiness on feet: Secondary | ICD-10-CM | POA: Diagnosis not present

## 2021-11-18 DIAGNOSIS — R208 Other disturbances of skin sensation: Secondary | ICD-10-CM | POA: Diagnosis not present

## 2021-11-19 NOTE — Telephone Encounter (Signed)
I reached out to [facility] to get update on this. I spoke with Gregory Osborn and he was able to confirm the pt has failed to give consent on Xeomin injection shipment after several attempts on their end and letter being sent. We have also attempted to reach the pt to give consent without resolution.   At this time we will wait to hear from the pt if he would like to proceed with this and the reprocess as necessary.

## 2021-11-20 DIAGNOSIS — G8194 Hemiplegia, unspecified affecting left nondominant side: Secondary | ICD-10-CM | POA: Diagnosis not present

## 2021-11-20 DIAGNOSIS — R208 Other disturbances of skin sensation: Secondary | ICD-10-CM | POA: Diagnosis not present

## 2021-11-20 DIAGNOSIS — R2681 Unsteadiness on feet: Secondary | ICD-10-CM | POA: Diagnosis not present

## 2021-11-20 DIAGNOSIS — M6281 Muscle weakness (generalized): Secondary | ICD-10-CM | POA: Diagnosis not present

## 2021-11-20 DIAGNOSIS — H538 Other visual disturbances: Secondary | ICD-10-CM | POA: Diagnosis not present

## 2021-11-20 DIAGNOSIS — R42 Dizziness and giddiness: Secondary | ICD-10-CM | POA: Diagnosis not present

## 2021-11-27 DIAGNOSIS — M6281 Muscle weakness (generalized): Secondary | ICD-10-CM | POA: Diagnosis not present

## 2021-11-27 DIAGNOSIS — G8194 Hemiplegia, unspecified affecting left nondominant side: Secondary | ICD-10-CM | POA: Diagnosis not present

## 2021-11-27 DIAGNOSIS — R2681 Unsteadiness on feet: Secondary | ICD-10-CM | POA: Diagnosis not present

## 2021-11-27 DIAGNOSIS — H538 Other visual disturbances: Secondary | ICD-10-CM | POA: Diagnosis not present

## 2021-11-27 DIAGNOSIS — R42 Dizziness and giddiness: Secondary | ICD-10-CM | POA: Diagnosis not present

## 2021-11-27 DIAGNOSIS — R208 Other disturbances of skin sensation: Secondary | ICD-10-CM | POA: Diagnosis not present

## 2021-11-30 DIAGNOSIS — I10 Essential (primary) hypertension: Secondary | ICD-10-CM | POA: Diagnosis not present

## 2021-11-30 DIAGNOSIS — E785 Hyperlipidemia, unspecified: Secondary | ICD-10-CM | POA: Diagnosis not present

## 2021-11-30 DIAGNOSIS — Z6827 Body mass index (BMI) 27.0-27.9, adult: Secondary | ICD-10-CM | POA: Diagnosis not present

## 2021-11-30 DIAGNOSIS — I69354 Hemiplegia and hemiparesis following cerebral infarction affecting left non-dominant side: Secondary | ICD-10-CM | POA: Diagnosis not present

## 2021-11-30 DIAGNOSIS — F339 Major depressive disorder, recurrent, unspecified: Secondary | ICD-10-CM | POA: Diagnosis not present

## 2021-12-04 DIAGNOSIS — G8194 Hemiplegia, unspecified affecting left nondominant side: Secondary | ICD-10-CM | POA: Diagnosis not present

## 2021-12-04 DIAGNOSIS — H538 Other visual disturbances: Secondary | ICD-10-CM | POA: Diagnosis not present

## 2021-12-04 DIAGNOSIS — R2681 Unsteadiness on feet: Secondary | ICD-10-CM | POA: Diagnosis not present

## 2021-12-04 DIAGNOSIS — M6281 Muscle weakness (generalized): Secondary | ICD-10-CM | POA: Diagnosis not present

## 2021-12-04 DIAGNOSIS — R208 Other disturbances of skin sensation: Secondary | ICD-10-CM | POA: Diagnosis not present

## 2021-12-04 DIAGNOSIS — R42 Dizziness and giddiness: Secondary | ICD-10-CM | POA: Diagnosis not present

## 2021-12-18 DIAGNOSIS — I69354 Hemiplegia and hemiparesis following cerebral infarction affecting left non-dominant side: Secondary | ICD-10-CM | POA: Diagnosis not present

## 2021-12-18 DIAGNOSIS — R42 Dizziness and giddiness: Secondary | ICD-10-CM | POA: Diagnosis not present

## 2021-12-18 DIAGNOSIS — R208 Other disturbances of skin sensation: Secondary | ICD-10-CM | POA: Diagnosis not present

## 2021-12-18 DIAGNOSIS — H538 Other visual disturbances: Secondary | ICD-10-CM | POA: Diagnosis not present

## 2021-12-18 DIAGNOSIS — M6281 Muscle weakness (generalized): Secondary | ICD-10-CM | POA: Diagnosis not present

## 2021-12-18 DIAGNOSIS — R2681 Unsteadiness on feet: Secondary | ICD-10-CM | POA: Diagnosis not present

## 2021-12-21 DIAGNOSIS — R2681 Unsteadiness on feet: Secondary | ICD-10-CM | POA: Diagnosis not present

## 2021-12-21 DIAGNOSIS — H538 Other visual disturbances: Secondary | ICD-10-CM | POA: Diagnosis not present

## 2021-12-21 DIAGNOSIS — M6281 Muscle weakness (generalized): Secondary | ICD-10-CM | POA: Diagnosis not present

## 2021-12-21 DIAGNOSIS — I69354 Hemiplegia and hemiparesis following cerebral infarction affecting left non-dominant side: Secondary | ICD-10-CM | POA: Diagnosis not present

## 2021-12-21 DIAGNOSIS — R42 Dizziness and giddiness: Secondary | ICD-10-CM | POA: Diagnosis not present

## 2021-12-21 DIAGNOSIS — R208 Other disturbances of skin sensation: Secondary | ICD-10-CM | POA: Diagnosis not present

## 2021-12-24 DIAGNOSIS — H9193 Unspecified hearing loss, bilateral: Secondary | ICD-10-CM | POA: Diagnosis not present

## 2021-12-24 DIAGNOSIS — H9313 Tinnitus, bilateral: Secondary | ICD-10-CM | POA: Diagnosis not present

## 2021-12-24 NOTE — Progress Notes (Signed)
[facility] [address] [place]. [place] [id number] [phone]       STROKE FOLLOW UP NOTE  [name] Date of Birth:  02-Feb-1946 Medical Record Number:  [id number]    Primary neurologist: Dr. Pearlean Osborn Reason for visit: stroke and seizure follow up    SUBJECTIVE:   CHIEF COMPLAINT:  Chief Complaint  Patient presents with   Follow-up    RM 2 with spouse Gregory Osborn and daughter Gregory Hashimotoatricia. Has [place] interpreter.   Pt is well, complains off dizziness as if he is "drunk"  Also complains of stomach pain and inflammation      HPI:   Gregory Osborn is a 76 y.o. 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     Update 12/28/2021 JM: Patient returns for 2054-month stroke and seizure follow-up accompanied by his wife, daughter and [place] interpreter  Stable from stroke standpoint without new stroke/TIA symptoms.  Residual deficits stable.  Compliant on aspirin and atorvastatin.  Blood pressure stable, routinely monitors at home and typically 110-130s.  Denies any additional seizure activity since August.  He has remained on Keppra 500 mg twice daily, denies side effects.  Did recommend dosage be increased at prior visit by Dr. Pearlean Osborn due to recurrent seizure in August but has remained on same dosage.  He does have other concerns today including dizziness and abdominal pain 1.  Dizziness - report persistent dizziness sensation even when sitting in w/c "I feel like I'm drunk", can worsen when going from sitting to standing.  Reports this has been  present "for years" even prior to his stroke but gradually worsening. Denies symptoms associated with quick head movement. Drinks some water, between 20-30 oz per day.  2.  Abdominal pain -reports present since PEG tube removal in 08/2020. Feels there is inflammation around the area.      History provided for reference purposes only Update 10/06/2021 Dr. Pearlean Osborn He returns for follow-up after last visit 4 months ago.  He is accompanied by his daughter and a Spanish language interpreter was present throughout this visit.  Patient was admitted to [facility] on 05/22/2021 where he was brought in by EMS after 1 minute episode of tonic-clonic seizure at home followed by some confusion.  He slowly returned to baseline in the ED.  Did not bite his tongue but his tongue felt sore and he has some trouble speaking.  CT head was obtained which showed no acute abnormalities.  Lab work was unremarkable.  Urine drug screen was negative.  He was started on Keppra 500 twice daily.  States he was compliant with taking his medicines but last Friday he had a similar episode but not as prolonged.  EMS was called to the home but patient did not go to the hospital.  This happened on his birthday as he was quite excited and there are a lot of people around.  His daughter who is a Engineer, maintenanceprivate nurse describes the episode as patient having a vacant look on his face and staring into the distance with head jerking and biting down on his tongue transient jerking of his limbs.  The patient became  responsive he felt as if he had just woken up from sleep.  Daughter informed me that he is probably had an EEG at [facility] and was told it was fine.  We had ordered an EEG,thru our  office but for unclear reasons it has not been performed.     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  He is currently doing outpatient physical and occupational therapy and follow-up.  He denied any known prior  history of seizures.  At last visit when seen by [facility] nurse practitioner he had described multiple daily episodes of full body tremors lasting 20 seconds which were not felt to be likely to be seizures.  He remains on Keppra 500 mg twice daily which is tolerating well without side effects.  He has not tried higher dose yet.  He remains on aspirin for stroke prevention which is tolerating well without bruising or bleeding.  He is tolerating Lipitor well without muscle aches and pains and Zestril for his hypertension.  Blood pressure is well controlled and today it is 131/98   Update 05/18/2021 JM: Patient returns for 55-month stroke follow-up accompanied by his wife, daughter and [place] interpreter but has new concerns today. PCP placed referral on 3/16 to be evaluated for possible seizures and tremors and was seen at [facility] ED on 3/20 for the same. Reports over the past 2 months, he has been experiencing episodes of whole body tremors (limbs and jaw), vocal tremor, dizziness and OD blurred vision. This occurs multiple times per day (approx 4-5x/day) and typically lasts less than 1 min. No specific triggers. Is aware during episode, does not lose consciousness, able to talk and respond appropriately to questions. Once episode subsides, immediately returned back to baseline without any postictal type symptoms. Denies any other associated symptoms. Denies any presence of headaches or history of seizures.  Denies any changes in medications around the time of onset or any other changes.   Of note, while speaking of this topic in office, he had typical event with jerking type movements that started in his abdomen, then went to leg, arm and jaw simultaneously, able to speak but with tremor. Lasted approx 20 seconds then completely returned back to baseline.   Obtained [facility] hospital notes - did obtain MR brain which reported wallowing degeneration in the brainstem on the right but no acute or new  findings.  Lab work largely unremarkable.  Evaluated by teleneurology - unable to determine etiology but noted nonspecific movement complaint and should be presumed to be tardive dyskinesia in absence of any provocative preceding medication.  Low suspicion for seizures.   Currently working with [facility] therapies for continued left-sided weakness with noted improvement. 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 He is currently taking baclofen 5 mg daily although previously prescribed 3 times daily dosing - per daughter, he has always been taking just 1 tab daily.  Also requests signing for AFO brace for left foot drop.  Remains nonambulatory and transfers via wheelchair.  Compliant on aspirin and atorvastatin, denies side effects.  Blood pressure today 122/79.  No further concerns at this time.    Update 11/17/2020 JM: Returns for 31-month follow-up accompanied by his wife and [place] interpreter.  He continues to reside with family and requires assistance for majority of ADLs.  He underwent PEG tube removal 09/04/2020 without complication.  Maintaining regular diet without difficulty  but family needs to help feed as he will try to put too much in his mouth at once which will cause difficulty swallowing.  Wife reports good appetite.  Left hemiplegia - no improvement since prior visit.  Left peripheral impairment stable. He is able to stand but unable to take any steps.  Transfers via wheelchair.  Completed HH therapies - is scheduled for initial PT eval next Monday as [facility]  Denies new stroke/TIA symptoms.  Compliant on aspirin and atorvastatin without side effects.  Blood pressure today 144/78.   S/p TURP on 10/23/2020 for BPH and urinary retention.  Reports occasional hematuria but otherwise voiding well without continued use of Foley catheter.  Routinely followed by urology Dr. Sabino Gasser  No further concerns at this  time  Update 08/05/2020 JM: Mr. Rollo returns for sooner visit due to concern of leaking G-tube.  He is accompanied by his daughter and [facility] interpreter.  He has since returned back home where daughter was told by home health nurse to contact our office due to G-tube concern.  G-tube has not been used in the past few months and is taking all nutrition, fluids and pills by mouth without difficulty.  He continues to work with home health PT reporting improvement since prior visit.  He is able to stand for short period of time but is not able to ambulate.  Continue left hemiplegia and visual impairment.  Foley catheter remains intact and has follow-up with urology next week for possible removal per daughter.  Does endorse occasional slurred speech.  Denies new stroke/TIA symptoms.  Compliant on aspirin and atorvastatin without associated side effects.  Blood pressure today 131/77.  No further concerns at this time.  Initial visit 06/10/2020 JM: Mr. Dibiasio is being seen for hospital follow-up accompanied by his daughter and [facility] interpreter.  He continues to reside at [facility] Reports residual left-sided weakness, left-sided numbness/tingling, blurred vision and dysphagia.  Does report mild speech difficulty but overall improved.  Currently receiving PT but per daughter and patient, has not received any OT or SLP.  He questions removal of PEG tube as he is eating a modified diet without difficulty and taking pills whole by mouth - PEG tube not currently being used.  He remains nonambulatory and uses Hoyer lift for transfers.  Daughter questions reason why he needs to continue residing at [facility] as she was initially told he would likely be there for 2 months and as he is only receiving PT, she questions benefit.  Denies new or worsening stroke/TIA symptoms  Per review of facility MAR, remains on aspirin 325 mg daily and atorvastatin 40 mg daily without associated side effects Blood  pressure today 137/77 - unknown levels at facility but per [facility], not currently receiving antihypertensives  Foley catheter remains in place with clear yellow urine draining -continues to follow with urology No further concerns at this time  Stroke admission 04/01/2020 Beckam Abdulaziz is a 76 y.o. male with a history of recent urologic issues including UTI (01/22), urinary retention with foley cath placed, hematuria (likely traumatic due to self cath) s/p cystoscopy 04/01/20 by Dr. Pete Glatter [facility]) showing BPH with recommended surgical treatment. Surgical history includes appendectomy. He had been doing well up until 11:25am on 2/16 when he was noted to develop left hemiplegia. EMS was contacted, and took him to the [facility] ED where stroke workup was undertaken. He was noted to have left hemiplegia and neglect with NIHSS score 15.  Shortly transferred to [facility].  Personally reviewed hospitalization pertinent progress notes, lab work and imaging with summary provided.  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  Repeat CT head 2/19 showed worsening shift and edema with MLS 69mm and 2/23 right MCA large infarct with MLS 29mm.  Evaluated by neurosurgery but patient deemed not to be a surgical candidate.  Recommend consideration of 30-day cardiac event monitor outpatient to rule out A. fib.  Initiated aspirin 325 mg daily for secondary stroke prevention.  LDL 118 -started atorvastatin 40 mg daily.  Other stroke risk factors include EtOH use but no prior stroke history.  PEG tube placed 3/8 in setting of dysphagia and poor p.o. intake.  Evaluated by therapies and recommend discharge to SNF for ongoing therapy needs  Stroke - Right malignant MCA infarct due to right M1 occlusion s/p unsuccessful thrombectomy, embolic pattern, source unclear CT showed right MCA infarct,  CTA head and neck right M1 occlusion   status post IR, unfortunately unsuccessful.   MRI showed large right MCA infarct involving entire right MCA, consistent with malignant right MCA syndrome.  Small right SAH likely due to hemorrhagic conversion.   MRA head showed right M1 occluded.  CT repeat 2/23 right MCA large infarct with MLS 53mm 2D Echo EF 60-65%, No shunt  May consider 30 day cardiac event monitoring as outpt to rule out afib LDL 118 HgbA1c 5.6 VTE prophylaxis - heparin subq No antithrombotics PTA, now on ASA 325mg  Therapy recommendations:  SNF Disposition:  SNF 3/11        ROS:   14 system review of systems performed and negative with exception of those listed in HPI  PMH: History reviewed. No pertinent past medical history.  PSH:  Past Surgical History:  Procedure Laterality Date   ESOPHAGOGASTRODUODENOSCOPY (EGD) WITH PROPOFOL N/A 04/22/2020   Procedure: ESOPHAGOGASTRODUODENOSCOPY (EGD) WITH PROPOFOL;  Surgeon: 06/22/2020, MD;  Location: [facility] ENDOSCOPY;  Service: General;  Laterality: N/A;   IR CT HEAD LTD  04/03/2020   IR CT HEAD LTD  04/03/2020   IR PERCUTANEOUS ART THROMBECTOMY/INFUSION INTRACRANIAL INC DIAG ANGIO  04/03/2020   PEG PLACEMENT N/A 04/22/2020   Procedure: PERCUTANEOUS ENDOSCOPIC GASTROSTOMY (PEG) PLACEMENT;  Surgeon: 06/22/2020, MD;  Location: [facility] ENDOSCOPY;  Service: General;  Laterality: N/A;   RADIOLOGY WITH ANESTHESIA N/A 04/02/2020   Procedure: IR WITH ANESTHESIA;  Surgeon: 04/04/2020, MD;  Location: [facility] OR;  Service: Radiology;  Laterality: N/A;    Social History:  Social History   Socioeconomic History   Marital status: Unknown    Spouse name: Not on file   Number of children: Not on file   Years of education: Not on file   Highest education level: Not on file  Occupational History   Not on file  Tobacco Use   Smoking status: Never   Smokeless tobacco: Never  Substance and Sexual Activity   Alcohol use: Not on file   Drug use: Not on file    Sexual activity: Not on file  Other Topics Concern   Not on file  Social History Narrative   Not on file   Social Determinants of Health   Financial Resource Strain: Not on file  Food Insecurity: Not on file  Transportation Needs: Not on file  Physical Activity: Not on file  Stress: Not on file  Social Connections: Not on file  Intimate Partner Violence: Not on  file    Family History: History reviewed. No pertinent family history.  Medications:   Current Outpatient Medications on File Prior to Visit  Medication Sig Dispense Refill   aspirin EC 81 MG tablet Take 81 mg by mouth daily. Swallow whole.     Baclofen 5 MG TABS Take 5 mg by mouth 2 (two) times daily. 60 tablet 5   levETIRAcetam (KEPPRA) 500 MG tablet Take 1 tablet (500 mg total) by mouth 2 (two) times daily. 60 tablet 5   lisinopril (ZESTRIL) 30 MG tablet Take by mouth.     Propylene Glycol (SYSTANE COMPLETE OP) Apply to eye.     rosuvastatin (CRESTOR) 40 MG tablet Take 40 mg by mouth at bedtime.     sertraline (ZOLOFT) 100 MG tablet Take 100 mg by mouth daily.     No current facility-administered medications on file prior to visit.    Allergies:  No Known Allergies    OBJECTIVE:  Physical Exam  Vitals:   12/28/21 1237  BP: (!) 147/79  Pulse: 82  Height: 5\' 5"  (1.651 m)   Body mass index is 27.99 kg/m. No results found.  General: Frail elderly Hispanic male, seated, in no evident distress Head: head normocephalic and atraumatic.   Neck: supple with no carotid or supraclavicular bruits Cardiovascular: regular rate and rhythm, no murmurs Musculoskeletal: no deformity Skin:  no rash/petichiae Vascular:  Normal pulses all extremities   Neurologic Exam Mental Status: Awake and fully alert. Reports occasional slurred speech but difficulty fully assessing as he is Spanish-speaking.  Recent and remote memory intact. Attention span, concentration and fund of knowledge intact during visit. Mood and affect  appropriate.  Cranial Nerves: Pupils equal, briskly reactive to light. Extraocular movements full without nystagmus.  . Visual fields no blink to threat on left side and blinks appropriately to threat on right side. Hearing intact. Facial sensation intact.  Left lower facial paralysis.  Tongue and palate moves normally and symmetrically. Motor: Normal strength, bulk and tone on right side.  Able to shrug left shoulder some and some resistance with elbow extension and flexion. LLE 3/5 HF, 4/5 KE, 3/5 KF, 1/5 ADF and APF Sensory.:  Reports equal sensation throughout all tested extremities Coordination: Rapid alternating movements normal on right side. Finger-to-nose and heel-to-shin performed accurately on right side Gait and Station: Deferred as patient nonambulatory Reflexes: 2+ LUE and LLE; 1+ RUE and RLE. Toes downgoing.        ASSESSMENT: [name] is a 76 y.o. 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  Multiple urological issues including UTI, urinary retention d/t BPH and hematuria s/p cystoscopy the day prior on 04/01/2020.  Vascular risk factors include HTN, HLD, advanced age and EtOH use. S/p TURP for BPH with retention 10/23/2020.  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  He does c/o abdominal tenderness/inflammation around prior PEG tube area - advised to further discuss with PCP for further evaluation.  Declines interest in further cardiac monitoring  Continue aspirin 325 mg daily  and atorvastatin for secondary  stroke prevention.   Discussed secondary stroke  prevention measures and importance of close PCP follow up for aggressive stroke risk factor management including BP goal<130/90, and HLD with LDL goal<70  Seizures, late effect of stroke Continue Keppra 500 mg twice daily as no additional seizures since August Discussed avoidance of seizure provoking triggers like sleep deprivation, medication noncompliance, regular eating and sleeping habits and extremes of exertion  Dizziness: Nonspecific, persistent when sitting but can worsen with position changes.  Present even prior to his stroke therefore likely not stroke related.  Discussed increasing water intake to at least 64 to 100 ounces of water per day and slow position changes/movements.  Encouraged him to follow back up with PCP to further discuss and possible need of cardiac or ENT evaluation      CC:  PCP: Charlott Rakes, MD    I spent 38 minutes of face-to-face and non-face-to-face time with patient, daughter and wife assisted by interpreter.  This included previsit chart review, electronic health record documentation, and patient and family education and discussion regarding above diagnoses and treatment plan and answered all the questions to patient and family satisfaction  Ihor Austin, [facility]  [facility] [address] [place], [place] [address]  Phone [phone] Fax [phone] Note: This document was prepared with digital dictation and possible smart phrase technology. Any transcriptional errors that result from this process are unintentional.

## 2021-12-25 DIAGNOSIS — M6281 Muscle weakness (generalized): Secondary | ICD-10-CM | POA: Diagnosis not present

## 2021-12-25 DIAGNOSIS — R42 Dizziness and giddiness: Secondary | ICD-10-CM | POA: Diagnosis not present

## 2021-12-25 DIAGNOSIS — I69354 Hemiplegia and hemiparesis following cerebral infarction affecting left non-dominant side: Secondary | ICD-10-CM | POA: Diagnosis not present

## 2021-12-25 DIAGNOSIS — H538 Other visual disturbances: Secondary | ICD-10-CM | POA: Diagnosis not present

## 2021-12-25 DIAGNOSIS — R208 Other disturbances of skin sensation: Secondary | ICD-10-CM | POA: Diagnosis not present

## 2021-12-25 DIAGNOSIS — R2681 Unsteadiness on feet: Secondary | ICD-10-CM | POA: Diagnosis not present

## 2021-12-28 ENCOUNTER — Encounter: Payer: Self-pay | Admitting: Adult Health

## 2021-12-28 ENCOUNTER — Ambulatory Visit: Payer: Medicare HMO | Admitting: Adult Health

## 2021-12-28 VITALS — BP 147/79 | HR 82 | Ht 65.0 in

## 2021-12-28 DIAGNOSIS — I63411 Cerebral infarction due to embolism of right middle cerebral artery: Secondary | ICD-10-CM | POA: Diagnosis not present

## 2021-12-28 DIAGNOSIS — R569 Unspecified convulsions: Secondary | ICD-10-CM | POA: Diagnosis not present

## 2021-12-28 DIAGNOSIS — R42 Dizziness and giddiness: Secondary | ICD-10-CM | POA: Diagnosis not present

## 2021-12-28 DIAGNOSIS — I69398 Other sequelae of cerebral infarction: Secondary | ICD-10-CM

## 2021-12-28 NOTE — Patient Instructions (Addendum)
Please follow back up with your primary doctor regarding concerns of dizziness - may need further evaluation with cardiology or ENT (ear, nose, throat) provider to rule out other causes, low suspicion coming from your stroke as symptoms present prior to your stroke and the location of your stroke should not be causing persistent dizziness  Please ensure your water intake to at least 64-100oz of water per day as increasing your water intake could help with persistent dizziness  Follow up with your primary doctor regarding abdominal pain  Continue keppra 500mg  twice daily for seizure prevention  Continue aspirin 81 mg daily  and atorvastatin  for secondary stroke prevention  Continue to follow up with PCP regarding cholesterol and blood pressure management  Maintain strict control of hypertension with blood pressure goal below 130/90 and cholesterol with LDL cholesterol (bad cholesterol) goal below 70 mg/dL.   Signs of a Stroke? Follow the BEFAST method:  Balance Watch for a sudden loss of balance, trouble with coordination or vertigo Eyes Is there a sudden loss of vision in one or both eyes? Or double vision?  Face: Ask the person to smile. Does one side of the face droop or is it numb?  Arms: Ask the person to raise both arms. Does one arm drift downward? Is there weakness or numbness of a leg? Speech: Ask the person to repeat a simple phrase. Does the speech sound slurred/strange? Is the person confused ? Time: If you observe any of these signs, call 911.     Followup in the future with me in 6 months or call earlier if needed      Thank you for coming to see at [facility]. I hope we have been able to provide you high quality care today.  You may receive a patient satisfaction survey over the next few weeks. We would appreciate your feedback and comments so that we may continue to improve ourselves and the health of our patients.   Mareos Dizziness Los  mareos son un problema muy frecuente. Se trata de una sensacin de inestabilidad o desvanecimiento. Puede sentir que se va a desmayar. Los [facility] pueden provocarle una lesin si se tropieza o se cae. Las [employer] de todas las edades pueden sufrir Dealer, Research scientist (life sciences) es ms frecuente en los adultos [place]. Esta afeccin puede tener muchas causas, entre las que se pueden [place], la deshidratacin y [place]. Siga estas instrucciones en su casa: Comida y bebida  Beba suficiente lquido como para [place] la orina de color amarillo plido. Esto evita la deshidratacin. Trate de beber ms lquidos transparentes, como agua. No beba alcohol. Limite el consumo de cafena si el mdico se lo indica. Verifique los ingredientes y la informacin nutricional para saber si un alimento o una bebida contienen cafena. Limite el consumo de sal (sodio) si el mdico se lo indica. Verifique los ingredientes y la informacin nutricional para saber si un alimento o una bebida contienen sodio. Actividad  Evite los movimientos rpidos. Levntese de las sillas con lentitud y apyese hasta sentirse bien. Por la maana, sintese primero a un lado de la cama. Cuando se sienta bien, pngase lentamente de Pharmacologist se sostiene de algo, hasta que sepa que ha logrado el equilibrio. Mueva las piernas con frecuencia si debe estar de pie en un lugar durante mucho tiempo. Mientras est de pie, contraiga y relaje los msculos de las piernas. No conduzca vehculos ni opere maquinaria si se siente mareado. Evite agacharse si se siente  mareado. En su casa, coloque los objetos de modo que le resulte fcil alcanzarlos sin Public librarian. Estilo de vida No consuma ningn producto que contenga nicotina o tabaco. Estos productos incluyen cigarrillos, tabaco para Theatre manager y aparatos de vapeo, como los Administrator, Civil Service. Si necesita ayuda para dejar de fumar, consulte al [employer]. Trate de reducir el nivel de estrs con  mtodos como el yoga o la meditacin. Hable con el mdico si necesita ayuda para controlar el nivel de estrs. Instrucciones generales Controle sus mareos para ver si hay cambios. Use los medicamentos de venta libre y los recetados solamente como se lo haya indicado el mdico. Hable con el mdico si cree que los medicamentos que est tomando son la causa de sus mareos. Infrmele a un amigo o a un familiar si se siente mareado. Pdale a esta persona que llame al mdico si observa cambios en su comportamiento. Concurra a todas las visitas de seguimiento. Esto es importante. Comunquese con un mdico si: Los mareos no desaparecen o tiene sntomas nuevos. Los [employer] o la sensacin de Production assistant, radio. Siente nuseas. Se le redujo la audicin. Tiene fiebre. Dolor o rigidez en el cuello. Los [employer] derivan en una lesin o una cada. Solicite ayuda de inmediato si: Vomita o tiene diarrea y no puede comer ni beber nada. Tiene dificultad para hablar, caminar, tragar o [employer], las manos o las piernas. Se siente constantemente dbil. Tiene cualquier tipo de sangrado. No piensa con claridad o tiene dificultad para armar oraciones. Es posible que un amigo o un familiar adviertan que esto ocurre. Tiene dolor de pecho, dolor abdominal, sudoracin o Company secretary. Tiene cambios en la visin o le aparece un dolor de cabeza intenso. Estos sntomas pueden representar un problema grave que constituye Radio broadcast assistant. No espere a ver si los sntomas desaparecen. Solicite atencin mdica de inmediato. Comunquese con el servicio de emergencias de su localidad (911 en los [place]). No conduzca por sus propios medios [employer]. Resumen Los mareos son Neomia Dear sensacin de inestabilidad o desvanecimiento. Esta afeccin puede tener muchas causas, entre las que se pueden [employer], la deshidratacin y [place]. Las Dealer de todas las edades pueden sufrir Research scientist (life sciences),  Biomedical engineer es ms frecuente en los adultos [place]. Beba suficiente lquido como para Pharmacologist la orina de color amarillo plido. No beba alcohol. Evite los movimientos rpidos si se siente mareado. Controle sus mareos para ver si hay cambios. Esta informacin no tiene Theme park manager el consejo del mdico. Asegrese de hacerle al mdico cualquier pregunta que tenga. Document Revised: 01/28/2020 Document Reviewed: 01/28/2020 Elsevier Patient Education  2023 [employer].

## 2021-12-28 NOTE — Progress Notes (Signed)
Done

## 2022-01-01 DIAGNOSIS — M6281 Muscle weakness (generalized): Secondary | ICD-10-CM | POA: Diagnosis not present

## 2022-01-01 DIAGNOSIS — R208 Other disturbances of skin sensation: Secondary | ICD-10-CM | POA: Diagnosis not present

## 2022-01-01 DIAGNOSIS — R42 Dizziness and giddiness: Secondary | ICD-10-CM | POA: Diagnosis not present

## 2022-01-01 DIAGNOSIS — R2681 Unsteadiness on feet: Secondary | ICD-10-CM | POA: Diagnosis not present

## 2022-01-01 DIAGNOSIS — H538 Other visual disturbances: Secondary | ICD-10-CM | POA: Diagnosis not present

## 2022-01-01 DIAGNOSIS — I69354 Hemiplegia and hemiparesis following cerebral infarction affecting left non-dominant side: Secondary | ICD-10-CM | POA: Diagnosis not present

## 2022-01-04 DIAGNOSIS — R2681 Unsteadiness on feet: Secondary | ICD-10-CM | POA: Diagnosis not present

## 2022-01-04 DIAGNOSIS — H8109 Meniere's disease, unspecified ear: Secondary | ICD-10-CM | POA: Diagnosis not present

## 2022-01-04 DIAGNOSIS — R198 Other specified symptoms and signs involving the digestive system and abdomen: Secondary | ICD-10-CM | POA: Diagnosis not present

## 2022-01-04 DIAGNOSIS — I69354 Hemiplegia and hemiparesis following cerebral infarction affecting left non-dominant side: Secondary | ICD-10-CM | POA: Diagnosis not present

## 2022-01-04 DIAGNOSIS — H538 Other visual disturbances: Secondary | ICD-10-CM | POA: Diagnosis not present

## 2022-01-04 DIAGNOSIS — M6281 Muscle weakness (generalized): Secondary | ICD-10-CM | POA: Diagnosis not present

## 2022-01-04 DIAGNOSIS — R208 Other disturbances of skin sensation: Secondary | ICD-10-CM | POA: Diagnosis not present

## 2022-01-04 DIAGNOSIS — R42 Dizziness and giddiness: Secondary | ICD-10-CM | POA: Diagnosis not present

## 2022-01-04 DIAGNOSIS — Z6827 Body mass index (BMI) 27.0-27.9, adult: Secondary | ICD-10-CM | POA: Diagnosis not present

## 2022-01-13 DIAGNOSIS — H538 Other visual disturbances: Secondary | ICD-10-CM | POA: Diagnosis not present

## 2022-01-13 DIAGNOSIS — I69354 Hemiplegia and hemiparesis following cerebral infarction affecting left non-dominant side: Secondary | ICD-10-CM | POA: Diagnosis not present

## 2022-01-13 DIAGNOSIS — R208 Other disturbances of skin sensation: Secondary | ICD-10-CM | POA: Diagnosis not present

## 2022-01-13 DIAGNOSIS — R42 Dizziness and giddiness: Secondary | ICD-10-CM | POA: Diagnosis not present

## 2022-01-13 DIAGNOSIS — R2681 Unsteadiness on feet: Secondary | ICD-10-CM | POA: Diagnosis not present

## 2022-01-13 DIAGNOSIS — M6281 Muscle weakness (generalized): Secondary | ICD-10-CM | POA: Diagnosis not present

## 2022-01-15 DIAGNOSIS — I69354 Hemiplegia and hemiparesis following cerebral infarction affecting left non-dominant side: Secondary | ICD-10-CM | POA: Diagnosis not present

## 2022-01-15 DIAGNOSIS — H538 Other visual disturbances: Secondary | ICD-10-CM | POA: Diagnosis not present

## 2022-01-15 DIAGNOSIS — R2681 Unsteadiness on feet: Secondary | ICD-10-CM | POA: Diagnosis not present

## 2022-01-15 DIAGNOSIS — R208 Other disturbances of skin sensation: Secondary | ICD-10-CM | POA: Diagnosis not present

## 2022-01-15 DIAGNOSIS — R42 Dizziness and giddiness: Secondary | ICD-10-CM | POA: Diagnosis not present

## 2022-01-15 DIAGNOSIS — M6281 Muscle weakness (generalized): Secondary | ICD-10-CM | POA: Diagnosis not present

## 2022-01-18 DIAGNOSIS — R208 Other disturbances of skin sensation: Secondary | ICD-10-CM | POA: Diagnosis not present

## 2022-01-18 DIAGNOSIS — R2681 Unsteadiness on feet: Secondary | ICD-10-CM | POA: Diagnosis not present

## 2022-01-18 DIAGNOSIS — M6281 Muscle weakness (generalized): Secondary | ICD-10-CM | POA: Diagnosis not present

## 2022-01-18 DIAGNOSIS — I69354 Hemiplegia and hemiparesis following cerebral infarction affecting left non-dominant side: Secondary | ICD-10-CM | POA: Diagnosis not present

## 2022-01-18 DIAGNOSIS — R42 Dizziness and giddiness: Secondary | ICD-10-CM | POA: Diagnosis not present

## 2022-01-18 DIAGNOSIS — H538 Other visual disturbances: Secondary | ICD-10-CM | POA: Diagnosis not present

## 2022-01-22 DIAGNOSIS — R2681 Unsteadiness on feet: Secondary | ICD-10-CM | POA: Diagnosis not present

## 2022-01-22 DIAGNOSIS — I69354 Hemiplegia and hemiparesis following cerebral infarction affecting left non-dominant side: Secondary | ICD-10-CM | POA: Diagnosis not present

## 2022-01-22 DIAGNOSIS — M6281 Muscle weakness (generalized): Secondary | ICD-10-CM | POA: Diagnosis not present

## 2022-01-22 DIAGNOSIS — R208 Other disturbances of skin sensation: Secondary | ICD-10-CM | POA: Diagnosis not present

## 2022-01-22 DIAGNOSIS — R42 Dizziness and giddiness: Secondary | ICD-10-CM | POA: Diagnosis not present

## 2022-01-22 DIAGNOSIS — H538 Other visual disturbances: Secondary | ICD-10-CM | POA: Diagnosis not present

## 2022-01-27 DIAGNOSIS — R2681 Unsteadiness on feet: Secondary | ICD-10-CM | POA: Diagnosis not present

## 2022-01-27 DIAGNOSIS — M6281 Muscle weakness (generalized): Secondary | ICD-10-CM | POA: Diagnosis not present

## 2022-01-27 DIAGNOSIS — I69354 Hemiplegia and hemiparesis following cerebral infarction affecting left non-dominant side: Secondary | ICD-10-CM | POA: Diagnosis not present

## 2022-01-27 DIAGNOSIS — H538 Other visual disturbances: Secondary | ICD-10-CM | POA: Diagnosis not present

## 2022-01-27 DIAGNOSIS — R208 Other disturbances of skin sensation: Secondary | ICD-10-CM | POA: Diagnosis not present

## 2022-01-27 DIAGNOSIS — R42 Dizziness and giddiness: Secondary | ICD-10-CM | POA: Diagnosis not present

## 2022-02-01 DIAGNOSIS — R208 Other disturbances of skin sensation: Secondary | ICD-10-CM | POA: Diagnosis not present

## 2022-02-01 DIAGNOSIS — R42 Dizziness and giddiness: Secondary | ICD-10-CM | POA: Diagnosis not present

## 2022-02-01 DIAGNOSIS — H538 Other visual disturbances: Secondary | ICD-10-CM | POA: Diagnosis not present

## 2022-02-01 DIAGNOSIS — M6281 Muscle weakness (generalized): Secondary | ICD-10-CM | POA: Diagnosis not present

## 2022-02-01 DIAGNOSIS — R2681 Unsteadiness on feet: Secondary | ICD-10-CM | POA: Diagnosis not present

## 2022-02-01 DIAGNOSIS — I69354 Hemiplegia and hemiparesis following cerebral infarction affecting left non-dominant side: Secondary | ICD-10-CM | POA: Diagnosis not present

## 2022-02-03 DIAGNOSIS — R2681 Unsteadiness on feet: Secondary | ICD-10-CM | POA: Diagnosis not present

## 2022-02-03 DIAGNOSIS — M6281 Muscle weakness (generalized): Secondary | ICD-10-CM | POA: Diagnosis not present

## 2022-02-03 DIAGNOSIS — I69354 Hemiplegia and hemiparesis following cerebral infarction affecting left non-dominant side: Secondary | ICD-10-CM | POA: Diagnosis not present

## 2022-02-03 DIAGNOSIS — R42 Dizziness and giddiness: Secondary | ICD-10-CM | POA: Diagnosis not present

## 2022-02-03 DIAGNOSIS — H538 Other visual disturbances: Secondary | ICD-10-CM | POA: Diagnosis not present

## 2022-02-03 DIAGNOSIS — R208 Other disturbances of skin sensation: Secondary | ICD-10-CM | POA: Diagnosis not present

## 2022-02-19 ENCOUNTER — Other Ambulatory Visit: Payer: Self-pay | Admitting: Neurology

## 2022-02-24 ENCOUNTER — Ambulatory Visit: Payer: Medicare HMO | Admitting: Neurology

## 2022-02-24 ENCOUNTER — Encounter: Payer: Self-pay | Admitting: Neurology

## 2022-02-24 VITALS — BP 155/79 | HR 88 | Ht 65.0 in

## 2022-02-24 DIAGNOSIS — R42 Dizziness and giddiness: Secondary | ICD-10-CM

## 2022-02-24 DIAGNOSIS — G8110 Spastic hemiplegia affecting unspecified side: Secondary | ICD-10-CM

## 2022-02-24 DIAGNOSIS — Z8669 Personal history of other diseases of the nervous system and sense organs: Secondary | ICD-10-CM | POA: Diagnosis not present

## 2022-02-24 MED ORDER — DIVALPROEX SODIUM 500 MG PO DR TAB: 500.0000 mg | DELAYED_RELEASE_TABLET | Freq: Every day | ORAL | 2 refills | Status: DC

## 2022-02-24 NOTE — Progress Notes (Signed)
[facility] [address] [place]. [place] [address] [phone]       STROKE FOLLOW UP NOTE  [name] Date of Birth:  01-23-1946 Medical Record Number:  [id number]   Reason for visit: stroke follow up    SUBJECTIVE:   CHIEF COMPLAINT:  Chief Complaint  Patient presents with   Follow-up    Rm 12.      HPI:   Gregory Osborn is a 77 y.o. 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  Routinely followed in office for stroke follow up.  Update 02/24/2022 : He returns for follow-up after last visit 4 months ago with [place] practitioner.  He is accompanied by his wife and daughter and Spanish language interpreter.  Patient complains of persistent dizziness.  He states his had ever since his stroke.  He had a few breakthrough seizures last year in April and August and Keppra dose was increased to 500 mg in the morning and 1000 mg at night.  Patient has had no further breakthrough seizures.  He remains on aspirin for tolerating well without bruising or bleeding.  His blood pressure is under good control though it is elevated slightly in the office today at 155/79.No recurrent stroke or TIA symptoms.  He also remains on Crestor which is tolerating well without muscle aches and pains.  He states his dizziness is constant and describes this as a feeling of off balance and feeling drunk.  He gets worse when he bends down or moves to either side.  He denies true vertigo.  He denies any ringing in his ear, or hearing loss. Update 10/06/2021 : He returns for follow-up after last visit 4 months ago.  He is accompanied by his daughter and a  Spanish language interpreter was present throughout this visit.  Patient was admitted to [facility] on 05/22/2021 where he was brought in by EMS after 1 minute episode of tonic-clonic seizure at home followed by some confusion.  He slowly returned to baseline in the ED.  Did not bite his tongue but his tongue felt sore and he has some trouble speaking.  CT head was obtained which showed no acute abnormalities.  Lab work was unremarkable.  Urine drug screen was negative.  He was started on Keppra 500 twice daily.  States he was compliant with taking his medicines but last Friday he had a similar episode but not as prolonged.  EMS was called to the home but patient did not go to the hospital.  This happened on his birthday as he was quite excited and there are a lot of people around.  His daughter who is a Film/video editor describes the episode as patient having a vacant look on his face and staring into the distance with head jerking and biting down on his tongue transient jerking of his limbs.  The patient became responsive he felt as if he had just woken up from sleep.  Daughter informed me that he is probably had an EEG at [facility] and was told it was fine.  We had ordered an EEG,thru our  office but for unclear reasons it has not been performed.     Patient continues to have spastic  left hemiplegia and is able to walk with a cane.  He is currently doing outpatient physical and occupational therapy and follow-up.  He denied any known prior history of seizures.  At last visit when seen by [facility] nurse practitioner he had described multiple daily episodes of full body tremors lasting 20 seconds which were not felt to be likely to be seizures.  He remains on Keppra 500 mg twice daily which is tolerating well without side effects.  He has not tried higher dose yet.  He remains on aspirin for stroke prevention which is tolerating well without bruising or bleeding.  He is tolerating Lipitor well without  muscle aches and pains and Zestril for his hypertension.  Blood pressure is well controlled and today it is 131/98  Update 05/18/2021 JM: Patient returns for 48-month stroke follow-up accompanied by his wife, daughter and [place] interpreter but has new concerns today. PCP placed referral on 3/16 to be evaluated for possible seizures and tremors and was seen at [facility] ED on 3/20 for the same. Reports over the past 2 months, he has been experiencing episodes of whole body tremors (limbs and jaw), vocal tremor, dizziness and OD blurred vision. This occurs multiple times per day (approx 4-5x/day) and typically lasts less than 1 min. No specific triggers. Is aware during episode, does not lose consciousness, able to talk and respond appropriately to questions. Once episode subsides, immediately returned back to baseline without any postictal type symptoms. Denies any other associated symptoms. Denies any presence of headaches or history of seizures.  Denies any changes in medications around the time of onset or any other changes.   Of note, while speaking of this topic in office, he had typical event with jerking type movements that started in his abdomen, then went to leg, arm and jaw simultaneously, able to speak but with tremor. Lasted approx 20 seconds then completely returned back to baseline.   Obtained [facility] hospital notes - did obtain MR brain which reported wallowing degeneration in the brainstem on the right but no acute or new findings.  Lab work largely unremarkable.  Evaluated by teleneurology - unable to determine etiology but noted nonspecific movement complaint and should be presumed to be tardive dyskinesia in absence of any provocative preceding medication.  Low suspicion for seizures.   Currently working with [facility] therapies for continued left-sided weakness with noted improvement. Daughter questions potential benefit with Botox for left hand contractor which was  recommended by therapy. He is currently taking baclofen 5 mg daily although previously prescribed 3 times daily dosing - per daughter, he has always been taking just 1 tab daily.  Also requests signing for AFO brace for left foot drop.  Remains nonambulatory and transfers via wheelchair.  Compliant on aspirin and atorvastatin, denies side effects.  Blood pressure today 122/79.  No further concerns at this time.       Update 11/17/2020 JM: Returns for 3-month follow-up accompanied by his wife and [place] interpreter.  He continues to reside with family and requires assistance for majority of ADLs.  He underwent PEG tube removal 09/04/2020 without complication.  Maintaining regular diet without difficulty but family needs to help feed as he will try to put too much in his mouth at once which will cause difficulty swallowing.  Wife reports good appetite.  Left hemiplegia - no improvement since prior visit.  Left peripheral impairment stable. He is able to stand but unable to take any steps.  Transfers via  wheelchair.  Completed HH therapies - is scheduled for initial PT eval next Monday as [facility].  Denies new stroke/TIA symptoms.  Compliant on aspirin and atorvastatin without side effects.  Blood pressure today 144/78.   S/p TURP on 10/23/2020 for BPH and urinary retention.  Reports occasional hematuria but otherwise voiding well without continued use of Foley catheter.  Routinely followed by urology Dr. Sabino Gasser  No further concerns at this time  Update 08/05/2020 [name] returns for sooner visit due to concern of leaking G-tube.  He is accompanied by his daughter and [facility] interpreter.  He has since returned back home where daughter was told by home health nurse to contact our office due to G-tube concern.  G-tube has not been used in the past few months and is taking all nutrition, fluids and pills by mouth without difficulty.  He continues to work with home health PT reporting  improvement since prior visit.  He is able to stand for short period of time but is not able to ambulate.  Continue left hemiplegia and visual impairment.  Foley catheter remains intact and has follow-up with urology next week for possible removal per daughter.  Does endorse occasional slurred speech.  Denies new stroke/TIA symptoms.  Compliant on aspirin and atorvastatin without associated side effects.  Blood pressure today 131/77.  No further concerns at this time.  Initial visit 06/10/2020 [name] is being seen for hospital follow-up accompanied by his daughter and [employer] interpreter.  He continues to reside at [facility] Reports residual left-sided weakness, left-sided numbness/tingling, blurred vision and dysphagia.  Does report mild speech difficulty but overall improved.  Currently receiving PT but per daughter and patient, has not received any OT or SLP.  He questions removal of PEG tube as he is eating a modified diet without difficulty and taking pills whole by mouth - PEG tube not currently being used.  He remains nonambulatory and uses Hoyer lift for transfers.  Daughter questions reason why he needs to continue residing at [facility] as she was initially told he would likely be there for 2 months and as he is only receiving PT, she questions benefit.  Denies new or worsening stroke/TIA symptoms  Per review of facility MAR, remains on aspirin 325 mg daily and atorvastatin 40 mg daily without associated side effects Blood pressure today 137/77 - unknown levels at facility but per [facility], not currently receiving antihypertensives  Foley catheter remains in place with clear yellow urine draining -continues to follow with urology No further concerns at this time  Stroke admission 04/01/2020 Gregory Osborn is a 77 y.o. male with a history of recent urologic issues including UTI (01/22), urinary retention with foley cath placed, hematuria (likely traumatic due to self cath) s/p  cystoscopy 04/01/20 by Dr. Pete Glatter [facility] showing BPH with recommended surgical treatment. Surgical history includes appendectomy. He had been doing well up until 11:25am on 2/16 when he was noted to develop left hemiplegia. EMS was contacted, and took him to the [facility] ED where stroke workup was undertaken. He was noted to have left hemiplegia and neglect with NIHSS score 15.  Shortly transferred to [facility].  Personally reviewed hospitalization pertinent progress notes, lab work and imaging with summary provided.  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  Repeat CT head 2/19 showed worsening shift and edema with MLS 41mm and 2/23 right MCA  large infarct with MLS 46mm.  Evaluated by neurosurgery but patient deemed not to be a surgical candidate.  Recommend consideration of 30-day cardiac event monitor outpatient to rule out A. fib.  Initiated aspirin 325 mg daily for secondary stroke prevention.  LDL 118 -started atorvastatin 40 mg daily.  Other stroke risk factors include EtOH use but no prior stroke history.  PEG tube placed 3/8 in setting of dysphagia and poor p.o. intake.  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   MRI showed large right MCA infarct involving entire right MCA, consistent with malignant right MCA syndrome.  Small right SAH likely due to hemorrhagic conversion.   MRA head showed right M1 occluded.  CT repeat 2/23 right MCA large infarct with MLS 89mm 2D Echo EF 60-65%, No shunt  May consider 30 day cardiac event monitoring as outpt to rule out afib LDL 118 HgbA1c 5.6 VTE prophylaxis - heparin  subq No antithrombotics PTA, now on ASA 325mg  Therapy recommendations:  SNF Disposition:  SNF 3/11        ROS:   14 system review of systems performed and negative with exception of those listed in HPI  PMH: History reviewed. No pertinent past medical history.  PSH:  Past Surgical History:  Procedure Laterality Date   ESOPHAGOGASTRODUODENOSCOPY (EGD) WITH PROPOFOL N/A 04/22/2020   Procedure: ESOPHAGOGASTRODUODENOSCOPY (EGD) WITH PROPOFOL;  Surgeon: 06/22/2020, MD;  Location: [facility] ENDOSCOPY;  Service: General;  Laterality: N/A;   IR CT HEAD LTD  04/03/2020   IR CT HEAD LTD  04/03/2020   IR PERCUTANEOUS ART THROMBECTOMY/INFUSION INTRACRANIAL INC DIAG ANGIO  04/03/2020   PEG PLACEMENT N/A 04/22/2020   Procedure: PERCUTANEOUS ENDOSCOPIC GASTROSTOMY (PEG) PLACEMENT;  Surgeon: 06/22/2020, MD;  Location: [facility] ENDOSCOPY;  Service: General;  Laterality: N/A;   RADIOLOGY WITH ANESTHESIA N/A 04/02/2020   Procedure: IR WITH ANESTHESIA;  Surgeon: 04/04/2020, MD;  Location: [facility] OR;  Service: Radiology;  Laterality: N/A;    Social History:  Social History   Socioeconomic History   Marital status: Unknown    Spouse name: Not on file   Number of children: Not on file   Years of education: Not on file   Highest education level: Not on file  Occupational History   Not on file  Tobacco Use   Smoking status: Never   Smokeless tobacco: Never  Substance and Sexual Activity   Alcohol use: Not on file   Drug use: Not on file   Sexual activity: Not on file  Other Topics Concern   Not on file  Social History Narrative   Not on file   Social Determinants of Health   Financial Resource Strain: Not on file  Food Insecurity: Not on file  Transportation Needs: Not on file  Physical Activity: Not on file  Stress: Not on file  Social Connections: Not on file  Intimate Partner Violence: Not on file    Family History: History reviewed. No pertinent family  history.  Medications:   Current Outpatient Medications on File Prior to Visit  Medication Sig Dispense Refill   aspirin EC 81 MG tablet Take 81 mg by mouth daily. Swallow whole.     Baclofen 5 MG TABS Take 5 mg by mouth  2 (two) times daily. 60 tablet 5   levETIRAcetam (KEPPRA) 500 MG tablet Take 1 tablet (500 mg total) by mouth 2 (two) times daily. 30 tablet 1   lisinopril (ZESTRIL) 30 MG tablet Take by mouth.     meclizine (ANTIVERT) 25 MG tablet Take 25 mg by mouth 3 (three) times daily as needed for dizziness.     rosuvastatin (CRESTOR) 40 MG tablet Take 40 mg by mouth at bedtime.     sertraline (ZOLOFT) 100 MG tablet Take 100 mg by mouth daily.     No current facility-administered medications on file prior to visit.    Allergies:  No Known Allergies    OBJECTIVE:  Physical Exam  Vitals:   02/24/22 1454  BP: (!) 155/79  Pulse: 88  Height: 5\' 5"  (1.651 m)   Body mass index is 27.99 kg/m. No results found.   General: Frail pleasant elderly Hispanic male, seated, in no evident distress Head: head normocephalic and atraumatic.   Neck: supple with no carotid or supraclavicular bruits Cardiovascular: regular rate and rhythm, no murmurs Musculoskeletal: no deformity Skin:  no rash/petichiae Vascular:  Normal pulses all extremities   Neurologic Exam Mental Status: Awake and fully alert. Reports occasional slurred speech but difficulty fully assessing as he is Spanish-speaking.  Recent and remote memory intact. Attention span, concentration and fund of knowledge intact during visit. Mood and affect appropriate.  Cranial Nerves: Pupils equal, briskly reactive to light. Extraocular movements full without nystagmus.  Left sided inattention (although improved since prior visit). Visual fields no blink to threat on left side and blinks appropriately to threat on right side. Hearing intact. Facial sensation intact.  Left lower facial paralysis.  Tongue and palate moves normally and  symmetrically. Motor: Normal strength, bulk and tone on right side.  Able to shrug left shoulder some and some resistance with elbow extension and flexion. LLE 2/5 HF, 3/5 KE, 2/5 KF, 1/5 ADF and APF Sensory.:  Reports equal sensation throughout all tested extremities Coordination: Rapid alternating movements normal on right side. Finger-to-nose and heel-to-shin performed accurately on right side Gait and Station: Deferred as patient nonambulatory Reflexes: 2+ LUE and LLE; 1+ RUE and RLE. Toes downgoing.        ASSESSMENT: Gregory Osborn is a 77 y.o. 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  Multiple urological issues including UTI, urinary retention d/t BPH and hematuria s/p cystoscopy the day prior on 04/01/2020.  Vascular risk factors include HTN, HLD, advanced age and EtOH use. S/p TURP for BPH with retention 10/23/2020. 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  New complaints of worsening dizziness after increased dose of Keppra for breakthrough seizures in August 2023    PLAN:  I had a long discussion with the patient, his wife and his daughter using Spanish language interpreter and answered questions.  He is complaining of dizziness which she appears to have increased after the Keppra dose was increased to 500 mg in the morning and 1000 mg at night.  I recommend we reduce the Keppra dose to 500 mg twice daily and add Depakote DR 500 mg daily as patient did have breakthrough seizures last year.  If he tolerates  this we will reduce Keppra further at next follow-up visit with my nurse practitioner Janett Billow in 3 months increase Depakote further as  needed.  Continue aspirin for stroke preventionnd maintain aggressive risk factor modification with strict control of hypertension with blood pressure goal below 130/90 and lipids with LDL cholesterol goal below 70 mg percent.  I spent 35 minutes of face-to-face and non-face-to-face time with patient, daughter and   assisted by interpreter.  This included previsit chart review including review of hospitalization at [facility], electronic health record documentation, order entry, and patient and wife education regarding prior stroke and secondary stroke prevention measures as well as importance of managing stroke risk factors, residual deficits and further recovery and answered all other questions to patient and wife's satisfaction  Antony Contras, [facility] [address] [place], [place] [address]  Phone [phone] Fax [phone] Note: This document was prepared with digital dictation and possible smart phrase technology. Any transcriptional errors that result from this process are unintentional.

## 2022-02-24 NOTE — Patient Instructions (Addendum)
I had a long discussion with the patient, his wife and his daughter using Spanish language interpreter and answered questions.  He is complaining of dizziness which she appears to have increased after the Keppra dose was increased to 500 mg in the morning and 1000 mg at night.  I recommend we reduce the Keppra dose to 500 mg twice daily and add Depakote DR 500 mg daily as patient did have breakthrough seizures last year.  If he tolerates this we will reduce Keppra further at next follow-up visit with my nurse practitioner Janett Billow in 3 months increase Depakote further as needed.  Continue aspirin for stroke preventionnd maintain aggressive risk factor modification with strict control of hypertension with blood pressure goal below 130/90 and lipids with LDL cholesterol goal below 70 mg percent.

## 2022-02-26 DIAGNOSIS — R208 Other disturbances of skin sensation: Secondary | ICD-10-CM | POA: Diagnosis not present

## 2022-02-26 DIAGNOSIS — R2681 Unsteadiness on feet: Secondary | ICD-10-CM | POA: Diagnosis not present

## 2022-02-26 DIAGNOSIS — R42 Dizziness and giddiness: Secondary | ICD-10-CM | POA: Diagnosis not present

## 2022-02-26 DIAGNOSIS — I69354 Hemiplegia and hemiparesis following cerebral infarction affecting left non-dominant side: Secondary | ICD-10-CM | POA: Diagnosis not present

## 2022-02-26 DIAGNOSIS — H538 Other visual disturbances: Secondary | ICD-10-CM | POA: Diagnosis not present

## 2022-02-26 DIAGNOSIS — M6281 Muscle weakness (generalized): Secondary | ICD-10-CM | POA: Diagnosis not present

## 2022-02-26 DIAGNOSIS — R278 Other lack of coordination: Secondary | ICD-10-CM | POA: Diagnosis not present

## 2022-02-27 DIAGNOSIS — E876 Hypokalemia: Secondary | ICD-10-CM | POA: Diagnosis not present

## 2022-02-27 DIAGNOSIS — G40909 Epilepsy, unspecified, not intractable, without status epilepticus: Secondary | ICD-10-CM | POA: Diagnosis not present

## 2022-02-27 DIAGNOSIS — Z20822 Contact with and (suspected) exposure to covid-19: Secondary | ICD-10-CM | POA: Diagnosis not present

## 2022-02-27 DIAGNOSIS — I639 Cerebral infarction, unspecified: Secondary | ICD-10-CM | POA: Diagnosis not present

## 2022-02-27 DIAGNOSIS — R9431 Abnormal electrocardiogram [ECG] [EKG]: Secondary | ICD-10-CM | POA: Diagnosis not present

## 2022-02-27 DIAGNOSIS — R000 Tachycardia, unspecified: Secondary | ICD-10-CM | POA: Diagnosis not present

## 2022-02-27 DIAGNOSIS — R569 Unspecified convulsions: Secondary | ICD-10-CM | POA: Diagnosis not present

## 2022-02-27 DIAGNOSIS — R55 Syncope and collapse: Secondary | ICD-10-CM | POA: Diagnosis not present

## 2022-03-04 DIAGNOSIS — R569 Unspecified convulsions: Secondary | ICD-10-CM | POA: Diagnosis not present

## 2022-03-04 DIAGNOSIS — Z789 Other specified health status: Secondary | ICD-10-CM | POA: Diagnosis not present

## 2022-03-04 DIAGNOSIS — I10 Essential (primary) hypertension: Secondary | ICD-10-CM | POA: Diagnosis not present

## 2022-03-04 DIAGNOSIS — Z7689 Persons encountering health services in other specified circumstances: Secondary | ICD-10-CM | POA: Diagnosis not present

## 2022-03-05 DIAGNOSIS — R42 Dizziness and giddiness: Secondary | ICD-10-CM | POA: Diagnosis not present

## 2022-03-05 DIAGNOSIS — R278 Other lack of coordination: Secondary | ICD-10-CM | POA: Diagnosis not present

## 2022-03-05 DIAGNOSIS — H538 Other visual disturbances: Secondary | ICD-10-CM | POA: Diagnosis not present

## 2022-03-05 DIAGNOSIS — M6281 Muscle weakness (generalized): Secondary | ICD-10-CM | POA: Diagnosis not present

## 2022-03-05 DIAGNOSIS — R2681 Unsteadiness on feet: Secondary | ICD-10-CM | POA: Diagnosis not present

## 2022-03-05 DIAGNOSIS — I69354 Hemiplegia and hemiparesis following cerebral infarction affecting left non-dominant side: Secondary | ICD-10-CM | POA: Diagnosis not present

## 2022-03-05 DIAGNOSIS — R208 Other disturbances of skin sensation: Secondary | ICD-10-CM | POA: Diagnosis not present

## 2022-03-08 ENCOUNTER — Telehealth: Payer: Self-pay | Admitting: Neurology

## 2022-03-08 NOTE — Telephone Encounter (Signed)
Received a voicemail that was left on Friday 1/19 from patient's PCP office Dr. Nyra Capes. [facility] left message stating Dr. Nyra Capes wants to get patient in for a sooner appointment because patient had another seizure and having "other" problems. Returned call, but had to LVM. Tried calling patient using interpreter, but had to LVM there as well. If patient or office calls back please speak with clinical staff-they had changed medication at last visit unsure if patient needs a sooner appt-was just seen 2 weeks ago with Dr. Leonie Man.

## 2022-03-08 NOTE — Telephone Encounter (Signed)
Courtney at [facility] is asking for a call to discuss what Dr Leonie Man suggest.  Loma Sousa can be reached at [phone] xt [phone]

## 2022-03-09 ENCOUNTER — Telehealth: Payer: Self-pay

## 2022-03-09 NOTE — Telephone Encounter (Signed)
I took incoming call from phone room and connected with Gregory Osborn with Dr. Nyra Capes office. She reports PCP is requesting the pt be worked back in for sooner f/u due to recent hospital visit and f/u concerning seizures and medication dosage.   Dr. Leonie Man does have some opening this week.  I have reached out to the pt and left vm for his daughter to call back. We can get this pt worked in.  Hilda Blades while we are waiting on appt can we call [name] and obtain records?  Thank you.

## 2022-03-10 NOTE — Telephone Encounter (Signed)
I called pt's daughter again and we spoke. She would like for her father to be seen do to recent seizures and hospital visit.   I have scheduled for 03/10/22 at 330 with Dr. Leonie Man.

## 2022-03-11 ENCOUNTER — Ambulatory Visit: Payer: Medicare HMO | Admitting: Neurology

## 2022-03-11 NOTE — Telephone Encounter (Signed)
Received hospital records bringing to POD 2

## 2022-03-12 DIAGNOSIS — R208 Other disturbances of skin sensation: Secondary | ICD-10-CM | POA: Diagnosis not present

## 2022-03-12 DIAGNOSIS — H538 Other visual disturbances: Secondary | ICD-10-CM | POA: Diagnosis not present

## 2022-03-12 DIAGNOSIS — R42 Dizziness and giddiness: Secondary | ICD-10-CM | POA: Diagnosis not present

## 2022-03-12 DIAGNOSIS — M6281 Muscle weakness (generalized): Secondary | ICD-10-CM | POA: Diagnosis not present

## 2022-03-12 DIAGNOSIS — R2681 Unsteadiness on feet: Secondary | ICD-10-CM | POA: Diagnosis not present

## 2022-03-12 DIAGNOSIS — R278 Other lack of coordination: Secondary | ICD-10-CM | POA: Diagnosis not present

## 2022-03-12 DIAGNOSIS — I69354 Hemiplegia and hemiparesis following cerebral infarction affecting left non-dominant side: Secondary | ICD-10-CM | POA: Diagnosis not present

## 2022-03-16 DIAGNOSIS — E785 Hyperlipidemia, unspecified: Secondary | ICD-10-CM | POA: Diagnosis not present

## 2022-03-17 DIAGNOSIS — H538 Other visual disturbances: Secondary | ICD-10-CM | POA: Diagnosis not present

## 2022-03-17 DIAGNOSIS — I69354 Hemiplegia and hemiparesis following cerebral infarction affecting left non-dominant side: Secondary | ICD-10-CM | POA: Diagnosis not present

## 2022-03-17 DIAGNOSIS — M6281 Muscle weakness (generalized): Secondary | ICD-10-CM | POA: Diagnosis not present

## 2022-03-17 DIAGNOSIS — R42 Dizziness and giddiness: Secondary | ICD-10-CM | POA: Diagnosis not present

## 2022-03-17 DIAGNOSIS — R208 Other disturbances of skin sensation: Secondary | ICD-10-CM | POA: Diagnosis not present

## 2022-03-17 DIAGNOSIS — R2681 Unsteadiness on feet: Secondary | ICD-10-CM | POA: Diagnosis not present

## 2022-03-17 DIAGNOSIS — R278 Other lack of coordination: Secondary | ICD-10-CM | POA: Diagnosis not present

## 2022-03-18 DIAGNOSIS — R569 Unspecified convulsions: Secondary | ICD-10-CM | POA: Diagnosis not present

## 2022-03-18 DIAGNOSIS — E785 Hyperlipidemia, unspecified: Secondary | ICD-10-CM | POA: Diagnosis not present

## 2022-03-19 DIAGNOSIS — H538 Other visual disturbances: Secondary | ICD-10-CM | POA: Diagnosis not present

## 2022-03-19 DIAGNOSIS — M6281 Muscle weakness (generalized): Secondary | ICD-10-CM | POA: Diagnosis not present

## 2022-03-19 DIAGNOSIS — R2681 Unsteadiness on feet: Secondary | ICD-10-CM | POA: Diagnosis not present

## 2022-03-19 DIAGNOSIS — I69354 Hemiplegia and hemiparesis following cerebral infarction affecting left non-dominant side: Secondary | ICD-10-CM | POA: Diagnosis not present

## 2022-03-19 DIAGNOSIS — R42 Dizziness and giddiness: Secondary | ICD-10-CM | POA: Diagnosis not present

## 2022-03-19 DIAGNOSIS — R208 Other disturbances of skin sensation: Secondary | ICD-10-CM | POA: Diagnosis not present

## 2022-03-22 DIAGNOSIS — I69354 Hemiplegia and hemiparesis following cerebral infarction affecting left non-dominant side: Secondary | ICD-10-CM | POA: Diagnosis not present

## 2022-03-22 DIAGNOSIS — I69359 Hemiplegia and hemiparesis following cerebral infarction affecting unspecified side: Secondary | ICD-10-CM | POA: Diagnosis not present

## 2022-03-22 DIAGNOSIS — H538 Other visual disturbances: Secondary | ICD-10-CM | POA: Diagnosis not present

## 2022-03-22 DIAGNOSIS — H8113 Benign paroxysmal vertigo, bilateral: Secondary | ICD-10-CM | POA: Diagnosis not present

## 2022-03-22 DIAGNOSIS — M6281 Muscle weakness (generalized): Secondary | ICD-10-CM | POA: Diagnosis not present

## 2022-03-22 DIAGNOSIS — Z6825 Body mass index (BMI) 25.0-25.9, adult: Secondary | ICD-10-CM | POA: Diagnosis not present

## 2022-03-22 DIAGNOSIS — R42 Dizziness and giddiness: Secondary | ICD-10-CM | POA: Diagnosis not present

## 2022-03-22 DIAGNOSIS — R208 Other disturbances of skin sensation: Secondary | ICD-10-CM | POA: Diagnosis not present

## 2022-03-22 DIAGNOSIS — R2681 Unsteadiness on feet: Secondary | ICD-10-CM | POA: Diagnosis not present

## 2022-03-22 DIAGNOSIS — E785 Hyperlipidemia, unspecified: Secondary | ICD-10-CM | POA: Diagnosis not present

## 2022-03-22 DIAGNOSIS — R569 Unspecified convulsions: Secondary | ICD-10-CM | POA: Diagnosis not present

## 2022-03-26 DIAGNOSIS — M6281 Muscle weakness (generalized): Secondary | ICD-10-CM | POA: Diagnosis not present

## 2022-03-26 DIAGNOSIS — H538 Other visual disturbances: Secondary | ICD-10-CM | POA: Diagnosis not present

## 2022-03-26 DIAGNOSIS — R2681 Unsteadiness on feet: Secondary | ICD-10-CM | POA: Diagnosis not present

## 2022-03-26 DIAGNOSIS — R208 Other disturbances of skin sensation: Secondary | ICD-10-CM | POA: Diagnosis not present

## 2022-03-26 DIAGNOSIS — R42 Dizziness and giddiness: Secondary | ICD-10-CM | POA: Diagnosis not present

## 2022-03-26 DIAGNOSIS — I69354 Hemiplegia and hemiparesis following cerebral infarction affecting left non-dominant side: Secondary | ICD-10-CM | POA: Diagnosis not present

## 2022-03-31 DIAGNOSIS — R42 Dizziness and giddiness: Secondary | ICD-10-CM | POA: Diagnosis not present

## 2022-03-31 DIAGNOSIS — R2681 Unsteadiness on feet: Secondary | ICD-10-CM | POA: Diagnosis not present

## 2022-03-31 DIAGNOSIS — H538 Other visual disturbances: Secondary | ICD-10-CM | POA: Diagnosis not present

## 2022-03-31 DIAGNOSIS — M6281 Muscle weakness (generalized): Secondary | ICD-10-CM | POA: Diagnosis not present

## 2022-03-31 DIAGNOSIS — I69354 Hemiplegia and hemiparesis following cerebral infarction affecting left non-dominant side: Secondary | ICD-10-CM | POA: Diagnosis not present

## 2022-03-31 DIAGNOSIS — R208 Other disturbances of skin sensation: Secondary | ICD-10-CM | POA: Diagnosis not present

## 2022-04-02 DIAGNOSIS — I69354 Hemiplegia and hemiparesis following cerebral infarction affecting left non-dominant side: Secondary | ICD-10-CM | POA: Diagnosis not present

## 2022-04-02 DIAGNOSIS — R2681 Unsteadiness on feet: Secondary | ICD-10-CM | POA: Diagnosis not present

## 2022-04-02 DIAGNOSIS — H538 Other visual disturbances: Secondary | ICD-10-CM | POA: Diagnosis not present

## 2022-04-02 DIAGNOSIS — R42 Dizziness and giddiness: Secondary | ICD-10-CM | POA: Diagnosis not present

## 2022-04-02 DIAGNOSIS — M6281 Muscle weakness (generalized): Secondary | ICD-10-CM | POA: Diagnosis not present

## 2022-04-02 DIAGNOSIS — R208 Other disturbances of skin sensation: Secondary | ICD-10-CM | POA: Diagnosis not present

## 2022-04-05 DIAGNOSIS — R2681 Unsteadiness on feet: Secondary | ICD-10-CM | POA: Diagnosis not present

## 2022-04-05 DIAGNOSIS — R42 Dizziness and giddiness: Secondary | ICD-10-CM | POA: Diagnosis not present

## 2022-04-05 DIAGNOSIS — M6281 Muscle weakness (generalized): Secondary | ICD-10-CM | POA: Diagnosis not present

## 2022-04-05 DIAGNOSIS — H538 Other visual disturbances: Secondary | ICD-10-CM | POA: Diagnosis not present

## 2022-04-05 DIAGNOSIS — I69354 Hemiplegia and hemiparesis following cerebral infarction affecting left non-dominant side: Secondary | ICD-10-CM | POA: Diagnosis not present

## 2022-04-05 DIAGNOSIS — R208 Other disturbances of skin sensation: Secondary | ICD-10-CM | POA: Diagnosis not present

## 2022-04-09 DIAGNOSIS — R2681 Unsteadiness on feet: Secondary | ICD-10-CM | POA: Diagnosis not present

## 2022-04-09 DIAGNOSIS — M6281 Muscle weakness (generalized): Secondary | ICD-10-CM | POA: Diagnosis not present

## 2022-04-09 DIAGNOSIS — I69354 Hemiplegia and hemiparesis following cerebral infarction affecting left non-dominant side: Secondary | ICD-10-CM | POA: Diagnosis not present

## 2022-04-09 DIAGNOSIS — H538 Other visual disturbances: Secondary | ICD-10-CM | POA: Diagnosis not present

## 2022-04-09 DIAGNOSIS — R208 Other disturbances of skin sensation: Secondary | ICD-10-CM | POA: Diagnosis not present

## 2022-04-09 DIAGNOSIS — R42 Dizziness and giddiness: Secondary | ICD-10-CM | POA: Diagnosis not present

## 2022-04-16 DIAGNOSIS — E785 Hyperlipidemia, unspecified: Secondary | ICD-10-CM | POA: Diagnosis not present

## 2022-04-16 DIAGNOSIS — R569 Unspecified convulsions: Secondary | ICD-10-CM | POA: Diagnosis not present

## 2022-04-26 DIAGNOSIS — R42 Dizziness and giddiness: Secondary | ICD-10-CM | POA: Diagnosis not present

## 2022-04-29 DIAGNOSIS — R531 Weakness: Secondary | ICD-10-CM | POA: Diagnosis not present

## 2022-04-29 DIAGNOSIS — R42 Dizziness and giddiness: Secondary | ICD-10-CM | POA: Diagnosis not present

## 2022-04-29 DIAGNOSIS — R262 Difficulty in walking, not elsewhere classified: Secondary | ICD-10-CM | POA: Diagnosis not present

## 2022-05-13 NOTE — Progress Notes (Signed)
[facility] [address] [place]. [place] [address] [phone]       STROKE FOLLOW UP NOTE  [name] Date of Birth:  09/14/1945 Medical Record Number:  [id number]    Primary neurologist: Dr. Leonie Man Reason for visit: stroke and seizure follow up    SUBJECTIVE:   CHIEF COMPLAINT:  Chief Complaint  Patient presents with   Follow-up    RM 3 with daughter Mardene Celeste , spouse Arsenio Loader  and [facility] interpreter  Pt is well and stable, no new CVA concerns. Reports last sz around Jan 13     HPI:   Gregory Osborn is a 77 y.o. 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     Update 05/17/2022 JM: Patient returns for 80-month follow-up accompanied by daughter and wife assisted by [facility] interpreter.  At prior visit with Dr. Leonie Man, complained of increased dizziness and felt possibly related to Keppra dosage, was reported at prior visit taking Keppra 500 mg a.m. and 1000 mg nightly, recommended decreasing to 500 mg twice daily and added Depakote 500 mg daily for further seizure prevention. Reports seizure activity 02/27/2022, was seen in ED (unable to view records), per daughter had low potassium level and felt this contributed to seizure, no medication adjustments made, potassium level has since normalized per daughter. no additional seizures since.  Tolerating current medications.  Reports intermittent dizziness persists but difficulty getting further information as he discusses post stroke left-sided symptoms when asked about dizziness.  Left-sided  symptoms stable since prior visit.  Denies new stroke/TIA symptoms.  Remains on aspirin and Crestor.  Blood pressure well-controlled.  Routinely follows with PCP.       History provided for reference purposes only Update 02/24/2022 Dr. Leonie Man: He returns for follow-up after last visit 4 months ago with [place] practitioner.  He is accompanied by his wife and daughter and Spanish language interpreter.  Patient complains of persistent dizziness.  He states his had ever since his stroke.  He had a few breakthrough seizures last year in April and August and Keppra dose was increased to 500 mg in the morning and 1000 mg at night.  Patient has had no further breakthrough seizures.  He remains on aspirin for tolerating well without bruising or bleeding.  His blood pressure is under good control though it is elevated slightly in the office today at 155/79.No recurrent stroke or TIA symptoms.  He also remains on Crestor which is tolerating well without muscle aches and pains.  He states his dizziness is constant and describes this as a feeling of off balance and feeling drunk.  He gets worse when he bends down or moves to either side.  He denies true vertigo.  He denies any ringing in his ear, or hearing loss.    Update 12/28/2021 JM: Patient returns for 66-month stroke and seizure follow-up accompanied by his wife, daughter and [place] interpreter  Stable from stroke standpoint without new stroke/TIA symptoms.  Residual deficits stable.  Compliant on aspirin and atorvastatin.  Blood pressure stable, routinely monitors at home and typically 110-130s.  Denies any additional seizure activity since August.  He has remained on Keppra 500 mg twice daily, denies side effects.  Did recommend dosage be increased at prior visit by Dr. Leonie Man due to recurrent seizure in August but has remained on same dosage.  He does have other concerns today including dizziness and abdominal pain 1.  Dizziness - report  persistent dizziness sensation even when sitting in w/c "I feel like I'm drunk", can worsen when going from sitting to standing.  Reports this has been present "for years" even prior to his stroke but gradually worsening. Denies symptoms associated with quick head movement. Drinks some water, between 20-30 oz per day.  2.  Abdominal pain -reports present since PEG tube removal in 08/2020. Feels there is inflammation around the area.   Update 10/06/2021 Dr. Leonie Man He returns for follow-up after last visit 4 months ago.  He is accompanied by his daughter and a Spanish language interpreter was present throughout this visit.  Patient was admitted to [facility] on 05/22/2021 where he was brought in by EMS after 1 minute episode of tonic-clonic seizure at home followed by some confusion.  He slowly returned to baseline in the ED.  Did not bite his tongue but his tongue felt sore and he has some trouble speaking.  CT head was obtained which showed no acute abnormalities.  Lab work was unremarkable.  Urine drug screen was negative.  He was started on Keppra 500 twice daily.  States he was compliant with taking his medicines but last Friday he had a similar episode but not as prolonged.  EMS was called to the home but patient did not go to the hospital.  This happened on his birthday as he was quite excited and there are a lot of people around.  His daughter who is a Film/video editor describes the episode as patient having a vacant look on his face and staring into the distance with head jerking and biting down on his tongue transient jerking of his limbs.  The patient became responsive he felt as if he had just woken up from sleep.  Daughter informed me that he is probably had an EEG at [facility] and was told it was fine.  We had ordered an EEG,thru our  office but for unclear reasons it has not been performed.     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  He is currently  doing outpatient physical and occupational therapy and follow-up.  He denied any known prior history of seizures.  At last visit when seen by [facility] nurse practitioner he had described multiple daily episodes of full body tremors lasting 20 seconds which were not felt to be likely to be seizures.  He remains on Keppra 500 mg twice daily which is tolerating well without side effects.  He has not tried higher dose yet.  He remains on aspirin for stroke prevention which is tolerating well without bruising or bleeding.  He is tolerating Lipitor well without muscle aches and pains and Zestril for his hypertension.  Blood pressure is well controlled and today it is 131/98    Update 05/18/2021 JM: Patient returns for 59-month stroke follow-up accompanied by his wife, daughter and  interpreter but has new concerns today. PCP placed referral on 3/16 to be evaluated for possible seizures and tremors and was seen at [facility] ED on 3/20 for the same. Reports over the past 2 months, he has been experiencing episodes of whole body tremors (limbs  and jaw), vocal tremor, dizziness and OD blurred vision. This occurs multiple times per day (approx 4-5x/day) and typically lasts less than 1 min. No specific triggers. Is aware during episode, does not lose consciousness, able to talk and respond appropriately to questions. Once episode subsides, immediately returned back to baseline without any postictal type symptoms. Denies any other associated symptoms. Denies any presence of headaches or history of seizures.  Denies any changes in medications around the time of onset or any other changes.   Of note, while speaking of this topic in office, he had typical event with jerking type movements that started in his abdomen, then went to leg, arm and jaw simultaneously, able to speak but with tremor. Lasted approx 20 seconds then completely returned back to baseline.   Obtained [facility] hospital notes - did obtain MR  brain which reported wallowing degeneration in the brainstem on the right but no acute or new findings.  Lab work largely unremarkable.  Evaluated by teleneurology - unable to determine etiology but noted nonspecific movement complaint and should be presumed to be tardive dyskinesia in absence of any provocative preceding medication.  Low suspicion for seizures.   Currently working with [facility] therapies for continued left-sided weakness with noted improvement. 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 He is currently taking baclofen 5 mg daily although previously prescribed 3 times daily dosing - per daughter, he has always been taking just 1 tab daily.  Also requests signing for AFO brace for left foot drop.  Remains nonambulatory and transfers via wheelchair.  Compliant on aspirin and atorvastatin, denies side effects.  Blood pressure today 122/79.  No further concerns at this time.    Update 11/17/2020 JM: Returns for 50-month follow-up accompanied by his wife and [place] interpreter.  He continues to reside with family and requires assistance for majority of ADLs.  He underwent PEG tube removal [id number] without complication.  Maintaining regular diet without difficulty but family needs to help feed as he will try to put too much in his mouth at once which will cause difficulty swallowing.  Wife reports good appetite.  Left hemiplegia - no improvement since prior visit.  Left peripheral impairment stable. He is able to stand but unable to take any steps.  Transfers via wheelchair.  Completed HH therapies - is scheduled for initial PT eval next Monday as [facility].  Denies new stroke/TIA symptoms.  Compliant on aspirin and atorvastatin without side effects.  Blood pressure today 144/78.   S/p TURP on 10/23/2020 for BPH and urinary retention.  Reports occasional hematuria but otherwise voiding well without continued use of  Foley catheter.  Routinely followed by urology Dr. Venia Minks  No further concerns at this time  Update 08/05/2020 [name] returns for sooner visit due to concern of leaking G-tube.  He is accompanied by his daughter and [facility] interpreter.  He has since returned back home where daughter was told by home health nurse to contact our office due to G-tube concern.  G-tube has not been used in the past few months and is taking all nutrition, fluids and pills by mouth without difficulty.  He continues to work with home health PT reporting improvement since prior visit.  He is able to stand for short period of time but is not able to ambulate.  Continue left hemiplegia and visual impairment.  Foley catheter remains intact and has follow-up with urology next week for  possible removal per daughter.  Does endorse occasional slurred speech.  Denies new stroke/TIA symptoms.  Compliant on aspirin and atorvastatin without associated side effects.  Blood pressure today 131/77.  No further concerns at this time.  Initial visit 06/10/2020 JM: [name] is being seen for hospital follow-up accompanied by his daughter and [facility] interpreter.  He continues to reside at [facility] Reports residual left-sided weakness, left-sided numbness/tingling, blurred vision and dysphagia.  Does report mild speech difficulty but overall improved.  Currently receiving PT but per daughter and patient, has not received any OT or SLP.  He questions removal of PEG tube as he is eating a modified diet without difficulty and taking pills whole by mouth - PEG tube not currently being used.  He remains nonambulatory and uses Hoyer lift for transfers.  Daughter questions reason why he needs to continue residing at [facility] as she was initially told he would likely be there for 2 months and as he is only receiving PT, she questions benefit.  Denies new or worsening stroke/TIA symptoms  Per review of facility MAR, remains on  aspirin 325 mg daily and atorvastatin 40 mg daily without associated side effects Blood pressure today 137/77 - unknown levels at facility but per [facility], not currently receiving antihypertensives  Foley catheter remains in place with clear yellow urine draining -continues to follow with urology No further concerns at this time  Stroke admission 04/01/2020 Gregory Osborn is a 77 y.o. male with a history of recent urologic issues including UTI (01/22), urinary retention with foley cath placed, hematuria (likely traumatic due to self cath) s/p cystoscopy 04/01/20 by Dr. Felipa Eth [facility]) showing BPH with recommended surgical treatment. Surgical history includes appendectomy. He had been doing well up until 11:25am on 2/16 when he was noted to develop left hemiplegia. EMS was contacted, and took him to the [facility] ED where stroke workup was undertaken. He was noted to have left hemiplegia and neglect with NIHSS score 15.  Shortly transferred to [facility].  Personally reviewed hospitalization pertinent progress notes, lab work and imaging with summary provided.  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  Repeat CT head 2/19 showed worsening shift and edema with MLS 64mm and 2/23 right MCA large infarct with MLS 24mm.  Evaluated by neurosurgery but patient deemed not to be a surgical candidate.  Recommend consideration of 30-day cardiac event monitor outpatient to rule out A. fib.  Initiated aspirin 325 mg daily for secondary stroke prevention.  LDL 118 -started atorvastatin 40 mg daily.  Other stroke risk factors include EtOH use but no prior stroke history.  PEG tube placed 3/8 in setting of dysphagia and poor p.o. intake.  Evaluated by therapies and recommend discharge to SNF for ongoing therapy needs  Stroke - Right malignant MCA infarct due to right M1 occlusion s/p unsuccessful thrombectomy, embolic  pattern, source unclear CT showed right MCA infarct,  CTA head and neck right M1 occlusion  status post IR, unfortunately unsuccessful.   MRI showed large right MCA infarct involving entire right MCA, consistent with malignant right MCA syndrome.  Small right SAH likely due to hemorrhagic conversion.   MRA head showed right M1 occluded.  CT repeat 2/23 right MCA large infarct with MLS 50mm 2D Echo EF 60-65%, No shunt  May consider 30 day cardiac event monitoring as outpt to rule out afib LDL 118 HgbA1c 5.6 VTE prophylaxis -  heparin subq No antithrombotics PTA, now on ASA 325mg  Therapy recommendations:  SNF Disposition:  SNF 3/11        ROS:   14 system review of systems performed and negative with exception of those listed in HPI  PMH: History reviewed. No pertinent past medical history.  PSH:  Past Surgical History:  Procedure Laterality Date   ESOPHAGOGASTRODUODENOSCOPY (EGD) WITH PROPOFOL N/A 04/22/2020   Procedure: ESOPHAGOGASTRODUODENOSCOPY (EGD) WITH PROPOFOL;  Surgeon: Georganna Skeans, MD;  Location: [facility];  Service: General;  Laterality: N/A;   IR CT HEAD LTD  04/03/2020   IR CT HEAD LTD  04/03/2020   IR PERCUTANEOUS ART THROMBECTOMY/INFUSION INTRACRANIAL INC DIAG ANGIO  04/03/2020   PEG PLACEMENT N/A 04/22/2020   Procedure: PERCUTANEOUS ENDOSCOPIC GASTROSTOMY (PEG) PLACEMENT;  Surgeon: Georganna Skeans, MD;  Location: [facility] ENDOSCOPY;  Service: General;  Laterality: N/A;   RADIOLOGY WITH ANESTHESIA N/A 04/02/2020   Procedure: IR WITH ANESTHESIA;  Surgeon: Pedro Earls, MD;  Location: [facility];  Service: Radiology;  Laterality: N/A;    Social History:  Social History   Socioeconomic History   Marital status: Unknown    Spouse name: Not on file   Number of children: Not on file   Years of education: Not on file   Highest education level: Not on file  Occupational History   Not on file  Tobacco Use   Smoking status: Never   Smokeless tobacco: Never   Substance and Sexual Activity   Alcohol use: Not on file   Drug use: Not on file   Sexual activity: Not on file  Other Topics Concern   Not on file  Social History Narrative   Not on file   Social Determinants of Health   Financial Resource Strain: Not on file  Food Insecurity: Not on file  Transportation Needs: Not on file  Physical Activity: Not on file  Stress: Not on file  Social Connections: Not on file  Intimate Partner Violence: Not on file    Family History: History reviewed. No pertinent family history.  Medications:   Current Outpatient Medications on File Prior to Visit  Medication Sig Dispense Refill   aspirin EC 81 MG tablet Take 81 mg by mouth daily. Swallow whole.     atorvastatin (LIPITOR) 40 MG tablet Take by mouth.     Baclofen 5 MG TABS Take 5 mg by mouth 2 (two) times daily. 60 tablet 5   divalproex (DEPAKOTE) 500 MG DR tablet Take 1 tablet (500 mg total) by mouth daily. 30 tablet 2   levETIRAcetam (KEPPRA) 500 MG tablet Take 1 tablet (500 mg total) by mouth 2 (two) times daily. 30 tablet 1   lisinopril (ZESTRIL) 30 MG tablet Take by mouth.     meclizine (ANTIVERT) 25 MG tablet Take 25 mg by mouth 3 (three) times daily as needed for dizziness.     potassium chloride SA (KLOR-CON M) 20 MEQ tablet Take 40 mEq by mouth daily.     sertraline (ZOLOFT) 100 MG tablet Take 100 mg by mouth daily.     No current facility-administered medications on file prior to visit.    Allergies:  No Known Allergies    OBJECTIVE:  Physical Exam  Vitals:   05/17/22 1245  BP: 134/75  Pulse: 81  Height: 5\' 5"  (1.651 m)    Body mass index is 27.99 kg/m. No results found.  General: Frail elderly Hispanic male, seated, in no evident distress Head: head normocephalic and atraumatic.  Neck: supple with no carotid or supraclavicular bruits Cardiovascular: regular rate and rhythm, no murmurs Musculoskeletal: no deformity Skin:  no rash/petichiae Vascular:  Normal  pulses all extremities   Neurologic Exam Mental Status: Awake and fully alert. Reports occasional slurred speech but difficulty fully assessing as he is Spanish-speaking.  Recent and remote memory intact. Attention span, concentration and fund of knowledge impaired during visit (will frequently speak off topic). Mood and affect appropriate.  Cranial Nerves: Pupils equal, briskly reactive to light. Extraocular movements full without nystagmus.  . Visual fields no blink to threat on left side and blinks appropriately to threat on right side. Hearing intact. Facial sensation intact.  Left lower facial paralysis.  Tongue and palate moves normally and symmetrically. Motor: Normal strength, bulk and tone on right side.  Able to shrug left shoulder some and some resistance with elbow extension and flexion otherwise 0/5. LLE 3/5 HF, 4/5 KE, 3/5 KF, 1/5 ADF and APF Sensory.:  Reports equal sensation throughout all tested extremities Coordination: Rapid alternating movements normal on right side. Finger-to-nose and heel-to-shin performed accurately on right side Gait and Station: Deferred as patient nonambulatory Reflexes: 2+ LUE and LLE; 1+ RUE and RLE. Toes downgoing.        ASSESSMENT: Henrick Melber is a 77 y.o. 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  Multiple urological issues including UTI, urinary retention d/t BPH and hematuria s/p cystoscopy the day prior on 04/01/2020.  Vascular risk factors include HTN, HLD, advanced age and EtOH use. S/p TURP for BPH with retention 10/23/2020.  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     PLAN:  R MCA stroke :  Significant residual deficits including left spastic hemiplegia with mild inattention, left  peripheral visual impairment and subjective dysarthria. Stable since prior visit.  Declines interest in further cardiac monitoring  Continue aspirin 325 mg daily  and atorvastatin for secondary stroke prevention.   Discussed secondary stroke prevention measures and importance of close PCP follow up for aggressive stroke risk factor management including BP goal<130/90, and HLD with LDL goal<70  Seizures, late effect of stroke Most recent seizure 02/27/2022 possibly due to hypokalemia vs medication change on 1/11 (lowered keppra dosage d/t potential side effects of dizziness and added Depakote) No additional seizures since, continue Keppra 500 mg twice daily and Depakote 500 mg daily Advised to call with any recurrent seizure activity Discussed avoidance of seizure provoking triggers like sleep deprivation, medication noncompliance, regular eating and sleeping habits and extremes of exertion    Follow-up in 6 months or call earlier if needed    CC:  PCP: Maryella Shivers, MD    I spent 36 minutes of face-to-face and non-face-to-face time with patient, daughter and wife assisted by interpreter.  This included previsit chart review, electronic health record documentation, and patient and family education and discussion regarding above diagnoses and treatment plan and answered all the questions to patient and family satisfaction  Frann Rider, [facility]  [facility] [address] [employer] [place], [place] [address]  Phone [phone] Fax [phone] Note: This document was prepared with digital dictation and possible smart phrase technology. Any transcriptional errors that result from this process are unintentional.

## 2022-05-16 ENCOUNTER — Other Ambulatory Visit: Payer: Self-pay | Admitting: Neurology

## 2022-05-17 ENCOUNTER — Encounter: Payer: Self-pay | Admitting: Adult Health

## 2022-05-17 ENCOUNTER — Telehealth: Payer: Self-pay | Admitting: Adult Health

## 2022-05-17 ENCOUNTER — Ambulatory Visit (INDEPENDENT_AMBULATORY_CARE_PROVIDER_SITE_OTHER): Payer: Medicare HMO | Admitting: Adult Health

## 2022-05-17 VITALS — BP 134/75 | HR 81 | Ht 65.0 in

## 2022-05-17 DIAGNOSIS — I69398 Other sequelae of cerebral infarction: Secondary | ICD-10-CM | POA: Diagnosis not present

## 2022-05-17 DIAGNOSIS — E785 Hyperlipidemia, unspecified: Secondary | ICD-10-CM | POA: Diagnosis not present

## 2022-05-17 DIAGNOSIS — I63411 Cerebral infarction due to embolism of right middle cerebral artery: Secondary | ICD-10-CM | POA: Diagnosis not present

## 2022-05-17 DIAGNOSIS — R569 Unspecified convulsions: Secondary | ICD-10-CM | POA: Diagnosis not present

## 2022-05-17 DIAGNOSIS — R6889 Other general symptoms and signs: Secondary | ICD-10-CM | POA: Diagnosis not present

## 2022-05-17 MED ORDER — DIVALPROEX SODIUM 500 MG PO DR TAB: 500.0000 mg | DELAYED_RELEASE_TABLET | Freq: Every day | ORAL | 11 refills | Status: AC

## 2022-05-17 MED ORDER — LEVETIRACETAM 500 MG PO TABS: 500.0000 mg | ORAL_TABLET | Freq: Two times a day (BID) | ORAL | 3 refills | Status: DC

## 2022-05-17 NOTE — Addendum Note (Signed)
Addended by: Frann Rider L on: 05/17/2022 01:52 PM   Modules accepted: Orders

## 2022-05-17 NOTE — Patient Instructions (Signed)
Continue keppra 500mg  twice daily and Depakote ER 500mg  nightly - please call with any seizure activity   Continue aspirin 325 mg daily  and atorvastatin  for secondary stroke prevention  Continue to follow up with PCP regarding cholesterol and blood pressure management  Maintain strict control of hypertension with blood pressure goal below 130/90 and cholesterol with LDL cholesterol (bad cholesterol) goal below 70 mg/dL.   Signs of a Stroke? Follow the BEFAST method:  Balance Watch for a sudden loss of balance, trouble with coordination or vertigo Eyes Is there a sudden loss of vision in one or both eyes? Or double vision?  Face: Ask the person to smile. Does one side of the face droop or is it numb?  Arms: Ask the person to raise both arms. Does one arm drift downward? Is there weakness or numbness of a leg? Speech: Ask the person to repeat a simple phrase. Does the speech sound slurred/strange? Is the person confused ? Time: If you observe any of these signs, call 911.     Followup in the future with me in 6 months or call earlier if needed       Thank you for coming to see [place] at [facility]. I hope we have been able to provide you high quality care today.  You may receive a patient satisfaction survey over the next few weeks. We would appreciate your feedback and comments so that we may continue to improve ourselves and the health of our patients.

## 2022-05-17 NOTE — Telephone Encounter (Signed)
Noted, thank you

## 2022-05-17 NOTE — Telephone Encounter (Signed)
Patient last seen today by Frann Rider and is requesting refill of depakote. Routing to provider to determine if they would like to refill.   Prior Dispenses:   Dispensed Days Supply Quantity Provider Pharmacy  DIVALPROEX DELAYED RELEASE 500MG  TB 04/16/2022 30 30 each Garvin Fila, MD [place] #...  DIVALPROEX DELAYED RELEASE 500MG  TB 03/19/2022 30 30 each Garvin Fila, MD [place] #...  DIVALPROEX DELAYED RELEASE 500MG  TB 02/24/2022 30 30 each Garvin Fila, MD [place] #.[facility]

## 2022-05-17 NOTE — Telephone Encounter (Signed)
Patient is okay to travel as long as he is not traveling to remote area and has emergency services within close distance.

## 2022-05-17 NOTE — Telephone Encounter (Signed)
Daughter came back in and states he is traveling out of country. Wants to know is that a good idea b/c he's gonna be there for 17mo if he goes. Is he ok to travel? Would like a call back.

## 2022-05-20 DIAGNOSIS — R531 Weakness: Secondary | ICD-10-CM | POA: Diagnosis not present

## 2022-05-26 DIAGNOSIS — R2681 Unsteadiness on feet: Secondary | ICD-10-CM | POA: Diagnosis not present

## 2022-05-26 DIAGNOSIS — R278 Other lack of coordination: Secondary | ICD-10-CM | POA: Diagnosis not present

## 2022-05-26 DIAGNOSIS — R531 Weakness: Secondary | ICD-10-CM | POA: Diagnosis not present

## 2022-06-01 DIAGNOSIS — R278 Other lack of coordination: Secondary | ICD-10-CM | POA: Diagnosis not present

## 2022-06-01 DIAGNOSIS — R2681 Unsteadiness on feet: Secondary | ICD-10-CM | POA: Diagnosis not present

## 2022-06-01 DIAGNOSIS — R531 Weakness: Secondary | ICD-10-CM | POA: Diagnosis not present

## 2022-06-07 DIAGNOSIS — R2681 Unsteadiness on feet: Secondary | ICD-10-CM | POA: Diagnosis not present

## 2022-06-07 DIAGNOSIS — R278 Other lack of coordination: Secondary | ICD-10-CM | POA: Diagnosis not present

## 2022-06-07 DIAGNOSIS — R531 Weakness: Secondary | ICD-10-CM | POA: Diagnosis not present

## 2022-06-14 DIAGNOSIS — R2681 Unsteadiness on feet: Secondary | ICD-10-CM | POA: Diagnosis not present

## 2022-06-14 DIAGNOSIS — R531 Weakness: Secondary | ICD-10-CM | POA: Diagnosis not present

## 2022-06-14 DIAGNOSIS — R278 Other lack of coordination: Secondary | ICD-10-CM | POA: Diagnosis not present

## 2022-06-16 DIAGNOSIS — R569 Unspecified convulsions: Secondary | ICD-10-CM | POA: Diagnosis not present

## 2022-06-16 DIAGNOSIS — E785 Hyperlipidemia, unspecified: Secondary | ICD-10-CM | POA: Diagnosis not present

## 2022-06-16 DIAGNOSIS — R278 Other lack of coordination: Secondary | ICD-10-CM | POA: Diagnosis not present

## 2022-06-16 DIAGNOSIS — R531 Weakness: Secondary | ICD-10-CM | POA: Diagnosis not present

## 2022-06-16 DIAGNOSIS — R2681 Unsteadiness on feet: Secondary | ICD-10-CM | POA: Diagnosis not present

## 2022-06-21 DIAGNOSIS — E785 Hyperlipidemia, unspecified: Secondary | ICD-10-CM | POA: Diagnosis not present

## 2022-06-21 DIAGNOSIS — I10 Essential (primary) hypertension: Secondary | ICD-10-CM | POA: Diagnosis not present

## 2022-06-22 DIAGNOSIS — R2681 Unsteadiness on feet: Secondary | ICD-10-CM | POA: Diagnosis not present

## 2022-06-22 DIAGNOSIS — R278 Other lack of coordination: Secondary | ICD-10-CM | POA: Diagnosis not present

## 2022-06-22 DIAGNOSIS — R531 Weakness: Secondary | ICD-10-CM | POA: Diagnosis not present

## 2022-06-25 DIAGNOSIS — R278 Other lack of coordination: Secondary | ICD-10-CM | POA: Diagnosis not present

## 2022-06-25 DIAGNOSIS — R531 Weakness: Secondary | ICD-10-CM | POA: Diagnosis not present

## 2022-06-25 DIAGNOSIS — R2681 Unsteadiness on feet: Secondary | ICD-10-CM | POA: Diagnosis not present

## 2022-06-28 DIAGNOSIS — I69354 Hemiplegia and hemiparesis following cerebral infarction affecting left non-dominant side: Secondary | ICD-10-CM | POA: Diagnosis not present

## 2022-06-28 DIAGNOSIS — E785 Hyperlipidemia, unspecified: Secondary | ICD-10-CM | POA: Diagnosis not present

## 2022-06-28 DIAGNOSIS — Z6826 Body mass index (BMI) 26.0-26.9, adult: Secondary | ICD-10-CM | POA: Diagnosis not present

## 2022-06-28 DIAGNOSIS — I10 Essential (primary) hypertension: Secondary | ICD-10-CM | POA: Diagnosis not present

## 2022-06-28 DIAGNOSIS — F339 Major depressive disorder, recurrent, unspecified: Secondary | ICD-10-CM | POA: Diagnosis not present

## 2022-07-06 DIAGNOSIS — R2681 Unsteadiness on feet: Secondary | ICD-10-CM | POA: Diagnosis not present

## 2022-07-06 DIAGNOSIS — R278 Other lack of coordination: Secondary | ICD-10-CM | POA: Diagnosis not present

## 2022-07-06 DIAGNOSIS — R531 Weakness: Secondary | ICD-10-CM | POA: Diagnosis not present

## 2022-07-08 DIAGNOSIS — R278 Other lack of coordination: Secondary | ICD-10-CM | POA: Diagnosis not present

## 2022-07-08 DIAGNOSIS — R2681 Unsteadiness on feet: Secondary | ICD-10-CM | POA: Diagnosis not present

## 2022-07-08 DIAGNOSIS — R531 Weakness: Secondary | ICD-10-CM | POA: Diagnosis not present

## 2022-07-17 DIAGNOSIS — R569 Unspecified convulsions: Secondary | ICD-10-CM | POA: Diagnosis not present

## 2022-07-17 DIAGNOSIS — E785 Hyperlipidemia, unspecified: Secondary | ICD-10-CM | POA: Diagnosis not present

## 2022-07-19 DIAGNOSIS — R531 Weakness: Secondary | ICD-10-CM | POA: Diagnosis not present

## 2022-07-19 DIAGNOSIS — R2681 Unsteadiness on feet: Secondary | ICD-10-CM | POA: Diagnosis not present

## 2022-07-19 DIAGNOSIS — R278 Other lack of coordination: Secondary | ICD-10-CM | POA: Diagnosis not present

## 2022-07-30 DIAGNOSIS — R531 Weakness: Secondary | ICD-10-CM | POA: Diagnosis not present

## 2022-07-30 DIAGNOSIS — R2681 Unsteadiness on feet: Secondary | ICD-10-CM | POA: Diagnosis not present

## 2022-07-30 DIAGNOSIS — R278 Other lack of coordination: Secondary | ICD-10-CM | POA: Diagnosis not present

## 2022-08-04 DIAGNOSIS — R2681 Unsteadiness on feet: Secondary | ICD-10-CM | POA: Diagnosis not present

## 2022-08-04 DIAGNOSIS — R278 Other lack of coordination: Secondary | ICD-10-CM | POA: Diagnosis not present

## 2022-08-04 DIAGNOSIS — R531 Weakness: Secondary | ICD-10-CM | POA: Diagnosis not present

## 2022-08-12 DIAGNOSIS — R278 Other lack of coordination: Secondary | ICD-10-CM | POA: Diagnosis not present

## 2022-08-12 DIAGNOSIS — R2681 Unsteadiness on feet: Secondary | ICD-10-CM | POA: Diagnosis not present

## 2022-08-12 DIAGNOSIS — R531 Weakness: Secondary | ICD-10-CM | POA: Diagnosis not present

## 2022-08-16 DIAGNOSIS — R569 Unspecified convulsions: Secondary | ICD-10-CM | POA: Diagnosis not present

## 2022-08-16 DIAGNOSIS — E785 Hyperlipidemia, unspecified: Secondary | ICD-10-CM | POA: Diagnosis not present

## 2022-09-16 DIAGNOSIS — R569 Unspecified convulsions: Secondary | ICD-10-CM | POA: Diagnosis not present

## 2022-09-16 DIAGNOSIS — E785 Hyperlipidemia, unspecified: Secondary | ICD-10-CM | POA: Diagnosis not present

## 2022-10-17 DIAGNOSIS — E785 Hyperlipidemia, unspecified: Secondary | ICD-10-CM | POA: Diagnosis not present

## 2022-10-17 DIAGNOSIS — R569 Unspecified convulsions: Secondary | ICD-10-CM | POA: Diagnosis not present

## 2022-10-29 DIAGNOSIS — H25813 Combined forms of age-related cataract, bilateral: Secondary | ICD-10-CM | POA: Diagnosis not present

## 2022-11-18 NOTE — Progress Notes (Deleted)
[facility] [address] [place]. [place] [address] [phone]       STROKE FOLLOW UP NOTE  [name] Date of Birth:  09-19-1945 Medical Record Number:  [id number]    Primary neurologist: Dr. Pearlean Brownie Reason for visit: stroke and seizure follow up    SUBJECTIVE:   CHIEF COMPLAINT:  No chief complaint on file.    HPI:   Gregory Osborn is a 78 y.o. 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     Update 11/22/2022 JM: Returns for follow-up visit accompanied by his wife and daughter assisted by [facility] health interpreter.   Remains on Keppra 500 mg twice daily and Depakote 500 mg daily   Residual deficits stable.  Compliant on stroke prevention medication.  Routinely follows with PCP for stroke risk factor management.     History provided for reference purposes only Update 05/17/2022 JM: Patient returns for 37-month follow-up accompanied by daughter and wife assisted by [facility] interpreter.  At prior visit with Dr. Pearlean Brownie, complained of increased dizziness and felt possibly related to Keppra dosage, was reported at prior visit taking Keppra 500 mg a.m. and 1000 mg nightly, recommended decreasing to 500 mg twice daily and added Depakote 500 mg daily for further seizure prevention. Reports seizure activity 02/27/2022, was seen in ED (unable to view records), per daughter had low potassium level and felt this contributed to seizure, no medication adjustments made, potassium level has since normalized per daughter. no additional seizures since.   Tolerating current medications.  Reports intermittent dizziness persists but difficulty getting further information as he discusses post stroke left-sided symptoms when asked about dizziness.  Left-sided symptoms stable since prior visit.  Denies new stroke/TIA symptoms.  Remains on aspirin and Crestor.  Blood pressure well-controlled.  Routinely follows with PCP.  Update 02/24/2022 Dr. Pearlean Brownie: He returns for follow-up after last visit 4 months ago with Gregory Osborn nurse practitioner.  He is accompanied by his wife and daughter and Spanish language interpreter.  Patient complains of persistent dizziness.  He states his had ever since his stroke.  He had a few breakthrough seizures last year in April and August and Keppra dose was increased to 500 mg in the morning and 1000 mg at night.  Patient has had no further breakthrough seizures.  He remains on aspirin for tolerating well without bruising or bleeding.  His blood pressure is under good control though it is elevated slightly in the office today at 155/79.No recurrent stroke or TIA symptoms.  He also remains on Crestor which is tolerating well without muscle aches and pains.  He states his dizziness is constant and describes this as a feeling of off balance and feeling drunk.  He gets worse when he bends down or moves to either side.  He denies true vertigo.  He denies any ringing in his ear, or hearing loss.    Update 12/28/2021 JM: Patient returns for 32-month stroke and seizure follow-up accompanied by his wife, daughter and [place] interpreter  Stable from stroke standpoint without new stroke/TIA symptoms.  Residual deficits stable.  Compliant on aspirin and atorvastatin.  Blood pressure stable,  routinely monitors at home and typically 110-130s.  Denies any additional seizure activity since August.  He has remained on Keppra 500 mg twice daily, denies side effects.  Did recommend dosage be increased at prior visit by Dr. Pearlean Brownie due to recurrent seizure  in August but has remained on same dosage.  He does have other concerns today including dizziness and abdominal pain 1.  Dizziness - report persistent dizziness sensation even when sitting in w/c "I feel like I'm drunk", can worsen when going from sitting to standing.  Reports this has been present "for years" even prior to his stroke but gradually worsening. Denies symptoms associated with quick head movement. Drinks some water, between 20-30 oz per day.  2.  Abdominal pain -reports present since PEG tube removal in 08/2020. Feels there is inflammation around the area.   Update 10/06/2021 Dr. Pearlean Brownie He returns for follow-up after last visit 4 months ago.  He is accompanied by his daughter and a Spanish language interpreter was present throughout this visit.  Patient was admitted to [facility] on 05/22/2021 where he was brought in by EMS after 1 minute episode of tonic-clonic seizure at home followed by some confusion.  He slowly returned to baseline in the ED.  Did not bite his tongue but his tongue felt sore and he has some trouble speaking.  CT head was obtained which showed no acute abnormalities.  Lab work was unremarkable.  Urine drug screen was negative.  He was started on Keppra 500 twice daily.  States he was compliant with taking his medicines but last Friday he had a similar episode but not as prolonged.  EMS was called to the home but patient did not go to the hospital.  This happened on his birthday as he was quite excited and there are a lot of people around.  His daughter who is a Engineer, maintenance describes the episode as patient having a vacant look on his face and staring into the distance with head jerking and biting down on his tongue transient jerking of his limbs.  The patient became responsive he felt as if he had just woken up from sleep.  Daughter informed me that he is probably had an EEG at [facility] and was told it was fine.  We had ordered an EEG,thru our  office but for  unclear reasons it has not been performed.     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  He is currently doing outpatient physical and occupational therapy and follow-up.  He denied any known prior history of seizures.  At last visit when seen by [facility] nurse practitioner he had described multiple daily episodes of full body tremors lasting 20 seconds which were not felt to be likely to be seizures.  He remains on Keppra 500 mg twice daily which is tolerating well without side effects.  He has not tried higher dose yet.  He remains on aspirin for stroke prevention which is tolerating well without bruising or bleeding.  He is tolerating Lipitor well without muscle aches and pains and Zestril for his hypertension.  Blood pressure is well controlled and today it is 131/98    Update 05/18/2021 JM: Patient returns for 104-month stroke follow-up accompanied by his wife, daughter and [place] interpreter but has new concerns today. PCP placed referral on 3/16 to be evaluated for possible seizures and tremors and was seen at [facility] ED on 3/20 for the same.  Reports over the past 2 months, he has been experiencing episodes of whole body tremors (limbs and jaw), vocal tremor, dizziness and OD blurred vision. This occurs multiple times per day (approx 4-5x/day) and typically lasts less than 1 min. No specific triggers. Is aware during episode, does not lose consciousness, able to talk and respond appropriately to questions. Once episode subsides, immediately returned back to baseline without any postictal type symptoms. Denies any other associated symptoms. Denies any presence of headaches or history of seizures.  Denies any changes in medications around the time of onset or any other changes.   Of note, while speaking of this topic in office, he had typical event with jerking type movements that started in his abdomen, then went to leg, arm and jaw simultaneously, able  to speak but with tremor. Lasted approx 20 seconds then completely returned back to baseline.   Obtained [facility] hospital notes - did obtain MR brain which reported wallowing degeneration in the brainstem on the right but no acute or new findings.  Lab work largely unremarkable.  Evaluated by teleneurology - unable to determine etiology but noted nonspecific movement complaint and should be presumed to be tardive dyskinesia in absence of any provocative preceding medication.  Low suspicion for seizures.   Currently working with [facility] therapies for continued left-sided weakness with noted improvement. 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 He is currently taking baclofen 5 mg daily although previously prescribed 3 times daily dosing - per daughter, he has always been taking just 1 tab daily.  Also requests signing for AFO brace for left foot drop.  Remains nonambulatory and transfers via wheelchair.  Compliant on aspirin and atorvastatin, denies side effects.  Blood pressure today 122/79.  No further concerns at this time.    Update 11/17/2020 JM: Returns for 54-month follow-up accompanied by his wife and [place] interpreter.  He continues to reside with family and requires assistance for majority of ADLs.  He underwent PEG tube removal 09/04/2020 without complication.  Maintaining regular diet without difficulty but family needs to help feed as he will try to put too much in his mouth at once which will cause difficulty swallowing.  Wife reports good appetite.  Left hemiplegia - no improvement since prior visit.  Left peripheral impairment stable. He is able to stand but unable to take any steps.  Transfers via wheelchair.  Completed HH therapies - is scheduled for initial PT eval next Monday as [facility].  Denies new stroke/TIA symptoms.  Compliant on aspirin and atorvastatin without side effects.  Blood pressure today  144/78.   S/p TURP on 10/23/2020 for BPH and urinary retention.  Reports occasional hematuria but otherwise voiding well without continued use of Foley catheter.  Routinely followed by urology Dr. Sabino Gasser  No further concerns at this time  Update 08/05/2020 [name] returns for sooner visit due to concern of leaking G-tube.  He is accompanied by his daughter and [facility] interpreter.  He has since returned back home where daughter was told by home health nurse to contact our office due to G-tube concern.  G-tube has not been used in the past few months and is taking all nutrition, fluids and pills by mouth without difficulty.  He continues to work with home health PT reporting improvement since prior visit.  He is able to stand for short period of time but is not able to ambulate.  Continue left hemiplegia  and visual impairment.  Foley catheter remains intact and has follow-up with urology next week for possible removal per daughter.  Does endorse occasional slurred speech.  Denies new stroke/TIA symptoms.  Compliant on aspirin and atorvastatin without associated side effects.  Blood pressure today 131/77.  No further concerns at this time.  Initial visit 06/10/2020 [name] is being seen for hospital follow-up accompanied by his daughter and [facility] interpreter.  He continues to reside at [facility] Reports residual left-sided weakness, left-sided numbness/tingling, blurred vision and dysphagia.  Does report mild speech difficulty but overall improved.  Currently receiving PT but per daughter and patient, has not received any OT or SLP.  He questions removal of PEG tube as he is eating a modified diet without difficulty and taking pills whole by mouth - PEG tube not currently being used.  He remains nonambulatory and uses Hoyer lift for transfers.  Daughter questions reason why he needs to continue residing at [facility] as she was initially told he would likely be there for 2 months  and as he is only receiving PT, she questions benefit.  Denies new or worsening stroke/TIA symptoms  Per review of facility MAR, remains on aspirin 325 mg daily and atorvastatin 40 mg daily without associated side effects Blood pressure today 137/77 - unknown levels at facility but per [facility], not currently receiving antihypertensives  Foley catheter remains in place with clear yellow urine draining -continues to follow with urology No further concerns at this time  Stroke admission 04/01/2020 Gregory Osborn is a 77 y.o. male with a history of recent urologic issues including UTI (01/22), urinary retention with foley cath placed, hematuria (likely traumatic due to self cath) s/p cystoscopy 04/01/20 by Dr[facility]) showing BPH with recommended surgical treatment. Surgical history includes appendectomy. He had been doing well up until 11:25am on 2/16 when he was noted to develop left hemiplegia. EMS was contacted, and took him to the [facility] ED where stroke workup was undertaken. He was noted to have left hemiplegia and neglect with NIHSS score 15.  Shortly transferred to [facility].  Personally reviewed hospitalization pertinent progress notes, lab work and imaging with summary provided.  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  Repeat CT head 2/19 showed worsening shift and edema with MLS 12mm and 2/23 right MCA large infarct with MLS 14mm.  Evaluated by neurosurgery but patient deemed not to be a surgical candidate.  Recommend consideration of 30-day cardiac event monitor outpatient to rule out A. fib.  Initiated aspirin 325 mg daily for secondary stroke prevention.  LDL 118 -started atorvastatin 40 mg daily.  Other stroke risk factors include EtOH use but no prior stroke history.  PEG tube placed 3/8 in setting of dysphagia and poor p.o. intake.  Evaluated by therapies and recommend discharge  to SNF for ongoing therapy needs  Stroke - Right malignant MCA infarct due to right M1 occlusion s/p unsuccessful thrombectomy, embolic pattern, source unclear CT showed right MCA infarct,  CTA head and neck right M1 occlusion  status post IR, unfortunately unsuccessful.   MRI showed large right MCA infarct involving entire right MCA, consistent with malignant right MCA syndrome.  Small right SAH likely due to hemorrhagic conversion.   MRA head showed right M1 occluded.  CT repeat 2/23 right MCA large infarct with MLS 14mm 2D Echo EF 60-65%, No shunt  May consider 30  day cardiac event monitoring as outpt to rule out afib LDL 118 HgbA1c 5.6 VTE prophylaxis - heparin subq No antithrombotics PTA, now on ASA 325mg  Therapy recommendations:  SNF Disposition:  SNF 3/11        ROS:   14 system review of systems performed and negative with exception of those listed in HPI  PMH: No past medical history on file.  PSH:  Past Surgical History:  Procedure Laterality Date   ESOPHAGOGASTRODUODENOSCOPY (EGD) WITH PROPOFOL N/A 04/22/2020   Procedure: ESOPHAGOGASTRODUODENOSCOPY (EGD) WITH PROPOFOL;  Surgeon: Violeta Gelinas, MD;  Location: [facility] ENDOSCOPY;  Service: General;  Laterality: N/A;   IR CT HEAD LTD  04/03/2020   IR CT HEAD LTD  04/03/2020   IR PERCUTANEOUS ART THROMBECTOMY/INFUSION INTRACRANIAL INC DIAG ANGIO  04/03/2020   PEG PLACEMENT N/A 04/22/2020   Procedure: PERCUTANEOUS ENDOSCOPIC GASTROSTOMY (PEG) PLACEMENT;  Surgeon: Violeta Gelinas, MD;  Location: [facility] ENDOSCOPY;  Service: General;  Laterality: N/A;   RADIOLOGY WITH ANESTHESIA N/A 04/02/2020   Procedure: IR WITH ANESTHESIA;  Surgeon: Baldemar Lenis, MD;  Location: [facility] OR;  Service: Radiology;  Laterality: N/A;    Social History:  Social History   Socioeconomic History   Marital status: Unknown    Spouse name: Not on file   Number of children: Not on file   Years of education: Not on file   Highest education  level: Not on file  Occupational History   Not on file  Tobacco Use   Smoking status: Never   Smokeless tobacco: Never  Substance and Sexual Activity   Alcohol use: Not on file   Drug use: Not on file   Sexual activity: Not on file  Other Topics Concern   Not on file  Social History Narrative   Not on file   Social Determinants of Health   Financial Resource Strain: Not on file  Food Insecurity: Not on file  Transportation Needs: Not on file  Physical Activity: Not on file  Stress: Not on file  Social Connections: Not on file  Intimate Partner Violence: Not on file    Family History: No family history on file.  Medications:   Current Outpatient Medications on File Prior to Visit  Medication Sig Dispense Refill   aspirin EC 81 MG tablet Take 81 mg by mouth daily. Swallow whole.     atorvastatin (LIPITOR) 40 MG tablet Take by mouth.     Baclofen 5 MG TABS Take 5 mg by mouth 2 (two) times daily. 60 tablet 5   divalproex (DEPAKOTE) 500 MG DR tablet Take 1 tablet (500 mg total) by mouth daily. 30 tablet 11   levETIRAcetam (KEPPRA) 500 MG tablet Take 1 tablet (500 mg total) by mouth 2 (two) times daily. 180 tablet 3   lisinopril (ZESTRIL) 30 MG tablet Take by mouth.     meclizine (ANTIVERT) 25 MG tablet Take 25 mg by mouth 3 (three) times daily as needed for dizziness.     potassium chloride SA (KLOR-CON M) 20 MEQ tablet Take 40 mEq by mouth daily.     sertraline (ZOLOFT) 100 MG tablet Take 100 mg by mouth daily.     No current facility-administered medications on file prior to visit.    Allergies:  No Known Allergies    OBJECTIVE:  Physical Exam  There were no vitals filed for this visit.   There is no height or weight on file to calculate BMI. No results found.  General: Frail elderly Hispanic male, seated,  in no evident distress Head: head normocephalic and atraumatic.   Neck: supple with no carotid or supraclavicular bruits Cardiovascular: regular rate and  rhythm, no murmurs Musculoskeletal: no deformity Skin:  no rash/petichiae Vascular:  Normal pulses all extremities   Neurologic Exam Mental Status: Awake and fully alert. Reports occasional slurred speech but difficulty fully assessing as he is Spanish-speaking.  Recent and remote memory intact. Attention span, concentration and fund of knowledge impaired during visit (will frequently speak off topic). Mood and affect appropriate.  Cranial Nerves: Pupils equal, briskly reactive to light. Extraocular movements full without nystagmus.  . Visual fields no blink to threat on left side and blinks appropriately to threat on right side. Hearing intact. Facial sensation intact.  Left lower facial paralysis.  Tongue and palate moves normally and symmetrically. Motor: Normal strength, bulk and tone on right side.  Able to shrug left shoulder some and some resistance with elbow extension and flexion otherwise 0/5. LLE 3/5 HF, 4/5 KE, 3/5 KF, 1/5 ADF and APF Sensory.:  Reports equal sensation throughout all tested extremities Coordination: Rapid alternating movements normal on right side. Finger-to-nose and heel-to-shin performed accurately on right side Gait and Station: Deferred as patient nonambulatory Reflexes: 2+ LUE and LLE; 1+ RUE and RLE. Toes downgoing.        ASSESSMENT: Gregory Osborn is a 77 y.o. 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  Multiple urological issues including UTI, urinary retention d/t BPH and hematuria s/p cystoscopy the day prior on 04/01/2020.  Vascular risk factors include HTN, HLD, advanced age and EtOH use. S/p TURP for BPH with retention 10/23/2020.  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     PLAN:  R MCA stroke :   Significant residual deficits including left spastic hemiplegia with mild inattention, left peripheral visual impairment and subjective dysarthria. Stable since prior visit.  Declines interest in further cardiac monitoring  Continue aspirin 325 mg daily  and atorvastatin for secondary stroke prevention.   Discussed secondary stroke prevention measures and importance of close PCP follow up for aggressive stroke risk factor management including BP goal<130/90, and HLD with LDL goal<70  Seizures, late effect of stroke Most recent seizure 02/27/2022 possibly due to hypokalemia vs medication change on 1/11 (lowered keppra dosage d/t potential side effects of dizziness and added Depakote) No additional seizures since, continue Keppra 500 mg twice daily and Depakote 500 mg daily Advised to call with any recurrent seizure activity Discussed avoidance of seizure provoking triggers like sleep deprivation, medication noncompliance, regular eating and sleeping habits and extremes of exertion    Follow-up in 6 months or call earlier if needed    CC:  PCP: Charlott Rakes, MD    I spent 36 minutes of face-to-face and non-face-to-face time with patient, daughter and wife assisted by interpreter.  This included previsit chart review, electronic health record documentation, and patient and family education and discussion regarding above diagnoses and treatment plan and answered all the questions to patient and family satisfaction  Ihor Austin, [facility]  [facility] [address] [place], [place] [address]  Phone [phone] Fax [phone] Note: This document was prepared with digital dictation and possible smart phrase technology. Any transcriptional errors that result from this process are unintentional.

## 2022-11-22 ENCOUNTER — Encounter: Payer: Self-pay | Admitting: Adult Health

## 2022-11-22 ENCOUNTER — Ambulatory Visit: Payer: Medicare HMO | Admitting: Adult Health

## 2023-03-17 DIAGNOSIS — E785 Hyperlipidemia, unspecified: Secondary | ICD-10-CM | POA: Diagnosis not present

## 2023-04-14 ENCOUNTER — Ambulatory Visit: Payer: Medicare HMO | Admitting: Adult Health

## 2023-05-17 ENCOUNTER — Ambulatory Visit (INDEPENDENT_AMBULATORY_CARE_PROVIDER_SITE_OTHER): Payer: Medicare HMO | Admitting: Neurology

## 2023-05-17 ENCOUNTER — Encounter: Payer: Self-pay | Admitting: Neurology

## 2023-05-17 VITALS — BP 149/85 | HR 83 | Ht 69.0 in | Wt 170.0 lb

## 2023-05-17 DIAGNOSIS — H8109 Meniere's disease, unspecified ear: Secondary | ICD-10-CM | POA: Diagnosis not present

## 2023-05-17 DIAGNOSIS — H53462 Homonymous bilateral field defects, left side: Secondary | ICD-10-CM

## 2023-05-17 DIAGNOSIS — G40909 Epilepsy, unspecified, not intractable, without status epilepticus: Secondary | ICD-10-CM | POA: Diagnosis not present

## 2023-05-17 DIAGNOSIS — G8110 Spastic hemiplegia affecting unspecified side: Secondary | ICD-10-CM

## 2023-05-17 DIAGNOSIS — R42 Dizziness and giddiness: Secondary | ICD-10-CM

## 2023-05-17 NOTE — Patient Instructions (Addendum)
 I had a long discussion with the patient, his brother in law  using Spanish language interpreter and answered questions.  He is complaining of dizziness as well as tinnitus and hearing loss and may need ENT referral to evaluate for Mnire's disease as it has persisted despite reducing the Keppra dose to 500 mg twice daily and add Depakote DR 500 mg daily . The patient did have breakthrough seizures last year but these have mostly been related to noncompliance.  I counseled him to be compliant with taking his medicines regularly without fail..     Continue aspirin for stroke preventionnd maintain aggressive risk factor modification with strict control of hypertension with blood pressure goal below 130/90 and lipids with LDL cholesterol goal below 70 mg percent.  Check lipid profile, hemoglobin A1c and carotid ultrasound.  Return for follow-up in the future in 1 year or call earlier if necessary.

## 2023-05-17 NOTE — Progress Notes (Signed)
 [facility] [address] [place]. [place] [address] [phone]       STROKE FOLLOW UP NOTE  [name] Date of Birth:  1946/01/22 Medical Record Number:  [id number]   Reason for visit: stroke follow up    SUBJECTIVE:   CHIEF COMPLAINT:  Chief Complaint  Patient presents with   Follow-up    Patient in room #1 with his brother and interpreter. Patient states he been feeling a little dizziness and numbness on the left side of his head. Patient states when he stands up he gets dizzy and the chair is moving.     HPI:   Gregory Osborn is a 78 y.o. 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  Routinely followed in office for stroke follow up.  Update 05/17/2023.:  He returns for follow-up after last visit with Shanda Bumps nurse practitioner 1 year ago.  He is accompanied by his brother-in-law and Spanish language interpreter.  Patient is still complaining of dizziness.  He feels this can happen at any time.  Mostly when he is sitting.  He is unable to walk due to his significant left hemiplegia and spends most of his time in a wheelchair.  The dizziness may be more pronounced when he moves.  He also complains of severe tinnitus in his right ear.  He is also noticed decreased hearing in his left greater than right ear.  He has not been evaluated by ENT.  Patient still has significant distal spastic left hemiplegia and numbness in the left hand.  Patient states he had a last seizure about 6 months ago.  He admits has not been quite compliant with taking his scheduled Keppra twice daily and Depakote once a day.  He remains on aspirin which is  tolerating well without bruising or bleeding.  He has had no further recurrent stroke or TIA symptoms Update 02/24/2022 : He returns for follow-up after last visit 4 months ago with Shanda Bumps nurse practitioner.  He is accompanied by his wife and daughter and Spanish language interpreter.  Patient complains of persistent dizziness.  He states his had ever since his stroke.  He had a few breakthrough seizures last year in April and August and Keppra dose was increased to 500 mg in the morning and 1000 mg at night.  Patient has had no further breakthrough seizures.  He remains on aspirin for tolerating well without bruising or bleeding.  His blood pressure is under good control though it is elevated slightly in the office today at 155/79.No recurrent stroke or TIA symptoms.  He also remains on Crestor which is tolerating well without muscle aches and pains.  He states his dizziness is constant and describes this as a feeling of off balance and feeling drunk.  He gets worse when he bends down or moves to either side.  He denies true vertigo.  He denies any ringing in his ear, or hearing loss. Update 10/06/2021 : He returns for follow-up after last visit 4 months ago.  He is accompanied by his daughter and a Spanish language interpreter was present throughout this visit.  Patient was admitted to [facility] on 05/22/2021 where he was brought in by EMS after 1 minute episode  of tonic-clonic seizure at home followed by some confusion.  He slowly returned to baseline in the ED.  Did not bite his tongue but his tongue felt sore and he has some trouble speaking.  CT head was obtained which showed no acute abnormalities.  Lab work was unremarkable.  Urine drug screen was negative.  He was started on Keppra 500 twice daily.  States he was compliant with taking his medicines but last Friday he had a similar episode but not as prolonged.  EMS was called to the home but patient did not go to the hospital.  This happened on his  birthday as he was quite excited and there are a lot of people around.  His daughter who is a Engineer, maintenance describes the episode as patient having a vacant look on his face and staring into the distance with head jerking and biting down on his tongue transient jerking of his limbs.  The patient became responsive he felt as if he had just woken up from sleep.  Daughter informed me that he is probably had an EEG at [facility] and was told it was fine.  We had ordered an EEG,thru our  office but for unclear reasons it has not been performed.     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  He is currently doing outpatient physical and occupational therapy and follow-up.  He denied any known prior history of seizures.  At last visit when seen by [facility] nurse practitioner he had described multiple daily episodes of full body tremors lasting 20 seconds which were not felt to be likely to be seizures.  He remains on Keppra 500 mg twice daily which is tolerating well without side effects.  He has not tried higher dose yet.  He remains on aspirin for stroke prevention which is tolerating well without bruising or bleeding.  He is tolerating Lipitor well without muscle aches and pains and Zestril for his hypertension.  Blood pressure is well controlled and today it is 131/98  Update 05/18/2021 JM: Patient returns for 1-month stroke follow-up accompanied by his wife, daughter and [place] interpreter but has new concerns today. PCP placed referral on 3/16 to be evaluated for possible seizures and tremors and was seen at [facility] ED on 3/20 for the same. Reports over the past 2 months, he has been experiencing episodes of whole body tremors (limbs and jaw), vocal tremor, dizziness and OD blurred vision. This occurs multiple times per day (approx 4-5x/day) and typically lasts less than 1 min. No specific triggers. Is aware during episode, does not lose consciousness,  able to talk and respond appropriately to questions. Once episode subsides, immediately returned back to baseline without any postictal type symptoms. Denies any other associated symptoms. Denies any presence of headaches or history of seizures.  Denies any changes in medications around the time of onset or any other changes.   Of note, while speaking of this topic in office, he had typical event with jerking type movements that started in his abdomen, then went to leg, arm and jaw simultaneously, able to speak but with tremor. Lasted approx 20 seconds then completely returned back to baseline.   Obtained [facility] hospital notes - did obtain MR brain which reported wallowing degeneration in the brainstem on the right but no acute or new findings.  Lab work largely unremarkable.  Evaluated by teleneurology - unable to determine etiology but noted nonspecific movement complaint and  should be presumed to be tardive dyskinesia in absence of any provocative preceding medication.  Low suspicion for seizures.   Currently working with [facility] therapies for continued left-sided weakness with noted improvement. 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 He is currently taking baclofen 5 mg daily although previously prescribed 3 times daily dosing - per daughter, he has always been taking just 1 tab daily.  Also requests signing for AFO brace for left foot drop.  Remains nonambulatory and transfers via wheelchair.  Compliant on aspirin and atorvastatin, denies side effects.  Blood pressure today 122/79.  No further concerns at this time.       Update 11/17/2020 JM: Returns for 87-month follow-up accompanied by his wife and  interpreter.  He continues to reside with family and requires assistance for majority of ADLs.  He underwent PEG tube removal 09/04/2020 without complication.  Maintaining regular diet without difficulty but  family needs to help feed as he will try to put too much in his mouth at once which will cause difficulty swallowing.  Wife reports good appetite.  Left hemiplegia - no improvement since prior visit.  Left peripheral impairment stable. He is able to stand but unable to take any steps.  Transfers via wheelchair.  Completed HH therapies - is scheduled for initial PT eval next Monday as [facility].  Denies new stroke/TIA symptoms.  Compliant on aspirin and atorvastatin without side effects.  Blood pressure today 144/78.   S/p TURP on 10/23/2020 for BPH and urinary retention.  Reports occasional hematuria but otherwise voiding well without continued use of Foley catheter.  Routinely followed by urology Dr. Sabino Gasser  No further concerns at this time  Update 08/05/2020 JM: Mr. Puertas returns for sooner visit due to concern of leaking G-tube.  He is accompanied by his daughter and [facility] interpreter.  He has since returned back home where daughter was told by home health nurse to contact our office due to G-tube concern.  G-tube has not been used in the past few months and is taking all nutrition, fluids and pills by mouth without difficulty.  He continues to work with home health PT reporting improvement since prior visit.  He is able to stand for short period of time but is not able to ambulate.  Continue left hemiplegia and visual impairment.  Foley catheter remains intact and has follow-up with urology next week for possible removal per daughter.  Does endorse occasional slurred speech.  Denies new stroke/TIA symptoms.  Compliant on aspirin and atorvastatin without associated side effects.  Blood pressure today 131/77.  No further concerns at this time.  Initial visit 06/10/2020 JM: Mr. Metoyer is being seen for hospital follow-up accompanied by his daughter and [facility] interpreter.  He continues to reside at [facility] Reports residual left-sided weakness, left-sided numbness/tingling,  blurred vision and dysphagia.  Does report mild speech difficulty but overall improved.  Currently receiving PT but per daughter and patient, has not received any OT or SLP.  He questions removal of PEG tube as he is eating a modified diet without difficulty and taking pills whole by mouth - PEG tube not currently being used.  He remains nonambulatory and uses Hoyer lift for transfers.  Daughter questions reason why he needs to continue residing at [facility] as she was initially told he would likely be there for 2 months and as he is only receiving PT, she questions benefit.  Denies new or worsening stroke/TIA symptoms  Per review of facility MAR, remains on aspirin 325 mg daily and atorvastatin 40 mg daily without associated side effects Blood pressure today 137/77 - unknown levels at facility but per [facility], not currently receiving antihypertensives  Foley catheter remains in place with clear yellow urine draining -continues to follow with urology No further concerns at this time  Stroke admission 04/01/2020 Wyat Infinger is a 78 y.o. male with a history of recent urologic issues including UTI (01/22), urinary retention with foley cath placed, hematuria (likely traumatic due to self cath) s/p cystoscopy 04/01/20 by Dr. Pete Glatter [facility]) showing BPH with recommended surgical treatment. Surgical history includes appendectomy. He had been doing well up until 11:25am on 2/16 when he was noted to develop left hemiplegia. EMS was contacted, and took him to the [facility] ED where stroke workup was undertaken. He was noted to have left hemiplegia and neglect with NIHSS score 15.  Shortly transferred to [facility].  Personally reviewed hospitalization pertinent progress notes, lab work and imaging with summary provided.  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  Repeat CT head 2/19 showed worsening shift and edema with  MLS 12mm and 2/23 right MCA large infarct with MLS 14mm.  Evaluated by neurosurgery but patient deemed not to be a surgical candidate.  Recommend consideration of 30-day cardiac event monitor outpatient to rule out A. fib.  Initiated aspirin 325 mg daily for secondary stroke prevention.  LDL 118 -started atorvastatin 40 mg daily.  Other stroke risk factors include EtOH use but no prior stroke history.  PEG tube placed 3/8 in setting of dysphagia and poor p.o. intake.  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   MRI showed large right MCA infarct involving entire right MCA, consistent with malignant right MCA syndrome.  Small right SAH likely due to hemorrhagic conversion.   MRA head showed right M1 occluded.  CT repeat 2/23 right MCA large infarct with MLS 14mm 2D Echo EF 60-65%, No shunt  May consider 30 day cardiac event monitoring as outpt to rule out afib LDL 118 HgbA1c 5.6 VTE prophylaxis - heparin subq No antithrombotics PTA, now on ASA 325mg  Therapy recommendations:  SNF Disposition:  SNF 3/11        ROS:   14 system review of systems performed and negative with exception of those listed in HPI  PMH: History reviewed. No pertinent past medical history.  PSH:  Past Surgical History:  Procedure Laterality Date   ESOPHAGOGASTRODUODENOSCOPY (EGD) WITH PROPOFOL N/A 04/22/2020   Procedure: ESOPHAGOGASTRODUODENOSCOPY (EGD) WITH PROPOFOL;  Surgeon: Violeta Gelinas, MD;  Location: [facility] ENDOSCOPY;  Service: General;  Laterality: N/A;   IR CT HEAD LTD  04/03/2020   IR CT HEAD LTD  04/03/2020   IR PERCUTANEOUS ART THROMBECTOMY/INFUSION INTRACRANIAL INC DIAG ANGIO  04/03/2020   PEG PLACEMENT N/A 04/22/2020   Procedure:  PERCUTANEOUS ENDOSCOPIC GASTROSTOMY (PEG) PLACEMENT;  Surgeon: Violeta Gelinas, MD;  Location: [facility] ENDOSCOPY;  Service: General;  Laterality: N/A;   RADIOLOGY WITH ANESTHESIA N/A 04/02/2020   Procedure: IR WITH ANESTHESIA;  Surgeon: Baldemar Lenis, MD;  Location: [facility] OR;  Service: Radiology;  Laterality:  N/A;    Social History:  Social History   Socioeconomic History   Marital status: Unknown    Spouse name: Not on file   Number of children: Not on file   Years of education: Not on file   Highest education level: Not on file  Occupational History   Not on file  Tobacco Use   Smoking status: Never   Smokeless tobacco: Never  Substance and Sexual Activity   Alcohol use: Not on file   Drug use: Not on file   Sexual activity: Not on file  Other Topics Concern   Not on file  Social History Narrative   Not on file   Social Drivers of Health   Financial Resource Strain: Not on file  Food Insecurity: Not on file  Transportation Needs: Not on file  Physical Activity: Not on file  Stress: Not on file  Social Connections: Not on file  Intimate Partner Violence: Not on file    Family History: History reviewed. No pertinent family history.  Medications:   Current Outpatient Medications on File Prior to Visit  Medication Sig Dispense Refill   aspirin EC 81 MG tablet Take 81 mg by mouth daily. Swallow whole.     Baclofen 5 MG TABS Take 5 mg by mouth 2 (two) times daily. 60 tablet 5   divalproex (DEPAKOTE) 500 MG DR tablet Take 1 tablet (500 mg total) by mouth daily. 30 tablet 11   levETIRAcetam (KEPPRA) 500 MG tablet Take 1 tablet (500 mg total) by mouth 2 (two) times daily. 180 tablet 3   lisinopril (ZESTRIL) 30 MG tablet Take by mouth.     meclizine (ANTIVERT) 25 MG tablet Take 25 mg by mouth 3 (three) times daily as needed for dizziness.     potassium chloride SA (KLOR-CON M) 20 MEQ tablet Take 40 mEq by mouth daily.     sertraline (ZOLOFT) 100 MG tablet Take 100 mg  by mouth daily.     atorvastatin (LIPITOR) 40 MG tablet Take by mouth. (Patient not taking: Reported on 05/17/2023)     No current facility-administered medications on file prior to visit.    Allergies:  No Known Allergies    OBJECTIVE:  Physical Exam  Vitals:   05/17/23 1445  BP: (!) 149/85  Pulse: 83  Weight: 170 lb (77.1 kg)  Height: 5\' 9"  (1.753 m)   Body mass index is 25.1 kg/m. No results found.   General: Frail pleasant elderly Hispanic male, seated, in no evident distress Head: head normocephalic and atraumatic.   Neck: supple with no carotid or supraclavicular bruits Cardiovascular: regular rate and rhythm, no murmurs Musculoskeletal: no deformity Skin:  no rash/petichiae Vascular:  Normal pulses all extremities   Neurologic Exam Mental Status: Awake and fully alert. Reports occasional slurred speech but difficulty fully assessing as he is Spanish-speaking.  Recent and remote memory intact. Attention span, concentration and fund of knowledge intact during visit. Mood and affect appropriate.  Cranial Nerves: Pupils equal, briskly reactive to light. Extraocular movements full without nystagmus.   . Visual fields no blink to threat on left side and blinks appropriately to threat on right side. Hearing intact. Facial sensation intact.  Left lower facial paralysis.  Tongue and palate moves normally and symmetrically. Motor: Normal strength, bulk and tone on right side.  Able to shrug left shoulder some and some resistance with elbow extension and flexion. LLE 2/5 HF, 3/5 KE, 2/5 KF, 1/5 ADF and APF increased tone on the left  with spasticity. Sensory.:  Reports equal sensation throughout all tested extremities Coordination: Rapid alternating movements normal on right side. Finger-to-nose and heel-to-shin performed accurately on right side Gait and Station: Deferred as patient nonambulatory Reflexes: 2+ LUE and LLE; 1+ RUE and RLE. Toes downgoing.        ASSESSMENT:  Gregory Osborn is a 78 y.o. 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  Multiple urological issues including UTI, urinary retention d/t BPH and hematuria s/p cystoscopy the day prior on 04/01/2020.  Vascular risk factors include HTN, HLD, advanced age and EtOH use. S/p TURP for BPH with retention 10/23/2020. 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  Continues to have breakthrough seizures likely related to noncompliance.  Continuing symptoms of dizziness with now tinnitus and hearing loss present concerning for Mnire's disease      PLAN:  I had a long discussion with the patient, his brother in law  using Spanish language interpreter and answered questions.  He is complaining of dizziness as well as tinnitus and hearing loss and may need ENT referral to evaluate for Mnire's disease as it has persisted despite reducing the Keppra dose to 500 mg twice daily and add Depakote DR 500 mg daily . The patient did have breakthrough seizures last year but these have mostly been related to noncompliance.  I counseled him to be compliant with taking his medicines regularly without fail..     Continue aspirin for stroke preventionnd maintain aggressive risk factor modification with strict control of hypertension with blood pressure goal below 130/90 and lipids with LDL cholesterol goal below 70 mg percent.  Check lipid profile, hemoglobin A1c and carotid ultrasound.  Return for follow-up in the future in 1 year or call earlier if necessary.w 70 mg percent.  I spent 40 minutes of face-to-face and non-face-to-face time with patient, and brother in law and   assisted by  interpreter.  This included previsit chart review including review of hospitalization at [facility], electronic health record documentation, order entry, and patient and wife education regarding prior stroke and secondary stroke prevention measures as well as importance of managing stroke risk factors, residual deficits and further recovery and answered all other questions to patient and wife's satisfaction  Delia Heady, MD  [facility] [address] [place], [place] [address]  Phone [phone] Fax [phone] Note: This document was prepared with digital dictation and possible smart phrase technology. Any transcriptional errors that result from this process are unintentional.

## 2023-05-18 LAB — LIPID PANEL
Chol/HDL Ratio: 5.4 ratio — ABNORMAL HIGH (ref 0.0–5.0)
Cholesterol, Total: 214 mg/dL — ABNORMAL HIGH (ref 100–199)
HDL: 40 mg/dL (ref >39)
LDL Chol Calc (NIH): 145 mg/dL — ABNORMAL HIGH (ref 0–99)
Triglycerides: 161 mg/dL — ABNORMAL HIGH (ref 0–149)
VLDL Cholesterol Cal: 29 mg/dL (ref 5–40)

## 2023-05-18 LAB — HEMOGLOBIN A1C
Est. average glucose Bld gHb Est-mCnc: 114 mg/dL
Hgb A1c MFr Bld: 5.6 % (ref 4.8–5.6)

## 2023-05-20 ENCOUNTER — Telehealth: Payer: Self-pay

## 2023-05-20 ENCOUNTER — Other Ambulatory Visit: Payer: Self-pay | Admitting: Neurology

## 2023-05-20 MED ORDER — ATORVASTATIN CALCIUM 80 MG PO TABS: 80.0000 mg | ORAL_TABLET | Freq: Every day | ORAL | 3 refills | Status: AC

## 2023-05-20 NOTE — Progress Notes (Signed)
 Kindly inform the patient that cholesterol profile is not satisfactory and I recommend increasing the dose of Lipitor to 80 mg daily.  Screening test for diabetes was satisfied 3.

## 2023-05-20 NOTE — Telephone Encounter (Signed)
-----   Message from Delia Heady sent at 05/20/2023  8:18 AM EDT ----- Joneen Roach inform the patient that cholesterol profile is not satisfactory and I recommend increasing the dose of Lipitor to 80 mg daily.  Screening test for diabetes was satisfied 3.

## 2023-05-20 NOTE — Addendum Note (Signed)
 Addended by: Danne Harbor on: 05/20/2023 09:51 AM   Modules accepted: Orders

## 2023-05-27 ENCOUNTER — Encounter (INDEPENDENT_AMBULATORY_CARE_PROVIDER_SITE_OTHER): Payer: Self-pay | Admitting: Otolaryngology

## 2023-06-22 ENCOUNTER — Encounter (INDEPENDENT_AMBULATORY_CARE_PROVIDER_SITE_OTHER): Payer: Self-pay | Admitting: Otolaryngology

## 2023-07-05 DIAGNOSIS — R569 Unspecified convulsions: Secondary | ICD-10-CM | POA: Diagnosis not present

## 2023-07-05 DIAGNOSIS — R42 Dizziness and giddiness: Secondary | ICD-10-CM | POA: Diagnosis not present

## 2023-07-05 DIAGNOSIS — R509 Fever, unspecified: Secondary | ICD-10-CM | POA: Diagnosis not present

## 2023-07-05 DIAGNOSIS — Z6826 Body mass index (BMI) 26.0-26.9, adult: Secondary | ICD-10-CM | POA: Diagnosis not present

## 2023-07-05 DIAGNOSIS — Z20822 Contact with and (suspected) exposure to covid-19: Secondary | ICD-10-CM | POA: Diagnosis not present

## 2023-07-18 DIAGNOSIS — I69354 Hemiplegia and hemiparesis following cerebral infarction affecting left non-dominant side: Secondary | ICD-10-CM | POA: Diagnosis not present

## 2023-07-18 DIAGNOSIS — I69359 Hemiplegia and hemiparesis following cerebral infarction affecting unspecified side: Secondary | ICD-10-CM | POA: Diagnosis not present

## 2023-07-18 DIAGNOSIS — R569 Unspecified convulsions: Secondary | ICD-10-CM | POA: Diagnosis not present

## 2023-07-18 DIAGNOSIS — Z6826 Body mass index (BMI) 26.0-26.9, adult: Secondary | ICD-10-CM | POA: Diagnosis not present

## 2023-07-29 ENCOUNTER — Ambulatory Visit (HOSPITAL_COMMUNITY): Attending: Neurology

## 2023-08-15 ENCOUNTER — Other Ambulatory Visit: Payer: Self-pay

## 2023-08-15 MED ORDER — LEVETIRACETAM 500 MG PO TABS: 500.0000 mg | ORAL_TABLET | Freq: Two times a day (BID) | ORAL | 3 refills | Status: AC

## 2023-08-16 DIAGNOSIS — I69354 Hemiplegia and hemiparesis following cerebral infarction affecting left non-dominant side: Secondary | ICD-10-CM | POA: Diagnosis not present

## 2023-08-16 DIAGNOSIS — F339 Major depressive disorder, recurrent, unspecified: Secondary | ICD-10-CM | POA: Diagnosis not present

## 2023-08-31 DIAGNOSIS — Z1389 Encounter for screening for other disorder: Secondary | ICD-10-CM | POA: Diagnosis not present

## 2023-08-31 DIAGNOSIS — Z139 Encounter for screening, unspecified: Secondary | ICD-10-CM | POA: Diagnosis not present

## 2023-08-31 DIAGNOSIS — Z0000 Encounter for general adult medical examination without abnormal findings: Secondary | ICD-10-CM | POA: Diagnosis not present

## 2023-08-31 DIAGNOSIS — Z136 Encounter for screening for cardiovascular disorders: Secondary | ICD-10-CM | POA: Diagnosis not present

## 2023-08-31 DIAGNOSIS — Z1331 Encounter for screening for depression: Secondary | ICD-10-CM | POA: Diagnosis not present

## 2023-08-31 DIAGNOSIS — Z1339 Encounter for screening examination for other mental health and behavioral disorders: Secondary | ICD-10-CM | POA: Diagnosis not present

## 2023-09-14 ENCOUNTER — Ambulatory Visit (INDEPENDENT_AMBULATORY_CARE_PROVIDER_SITE_OTHER): Admitting: Audiology

## 2023-09-14 ENCOUNTER — Institutional Professional Consult (permissible substitution) (INDEPENDENT_AMBULATORY_CARE_PROVIDER_SITE_OTHER): Admitting: Otolaryngology

## 2023-11-04 DIAGNOSIS — H25813 Combined forms of age-related cataract, bilateral: Secondary | ICD-10-CM | POA: Diagnosis not present

## 2023-12-16 DIAGNOSIS — I69354 Hemiplegia and hemiparesis following cerebral infarction affecting left non-dominant side: Secondary | ICD-10-CM | POA: Diagnosis not present

## 2023-12-16 DIAGNOSIS — E785 Hyperlipidemia, unspecified: Secondary | ICD-10-CM | POA: Diagnosis not present

## 2023-12-16 DIAGNOSIS — H269 Unspecified cataract: Secondary | ICD-10-CM | POA: Diagnosis not present

## 2023-12-16 DIAGNOSIS — E669 Obesity, unspecified: Secondary | ICD-10-CM | POA: Diagnosis not present

## 2023-12-16 DIAGNOSIS — F325 Major depressive disorder, single episode, in full remission: Secondary | ICD-10-CM | POA: Diagnosis not present

## 2023-12-16 DIAGNOSIS — Z7982 Long term (current) use of aspirin: Secondary | ICD-10-CM | POA: Diagnosis not present

## 2023-12-16 DIAGNOSIS — I10 Essential (primary) hypertension: Secondary | ICD-10-CM | POA: Diagnosis not present

## 2023-12-16 DIAGNOSIS — G40909 Epilepsy, unspecified, not intractable, without status epilepticus: Secondary | ICD-10-CM | POA: Diagnosis not present

## 2023-12-16 DIAGNOSIS — N400 Enlarged prostate without lower urinary tract symptoms: Secondary | ICD-10-CM | POA: Diagnosis not present

## 2023-12-28 ENCOUNTER — Institutional Professional Consult (permissible substitution) (INDEPENDENT_AMBULATORY_CARE_PROVIDER_SITE_OTHER)

## 2023-12-28 ENCOUNTER — Institutional Professional Consult (permissible substitution) (INDEPENDENT_AMBULATORY_CARE_PROVIDER_SITE_OTHER): Admitting: Otolaryngology

## 2024-01-23 ENCOUNTER — Institutional Professional Consult (permissible substitution) (INDEPENDENT_AMBULATORY_CARE_PROVIDER_SITE_OTHER)

## 2024-05-17 ENCOUNTER — Ambulatory Visit: Admitting: Neurology
